# Patient Record
Sex: Male | Born: 1970 | Race: White | Hispanic: No | Marital: Single | Smoking: Current every day smoker
Health system: Southern US, Community
[De-identification: ages and names within clinical notes are randomized; demographics above are authoritative.]

## PROBLEM LIST (undated history)

## (undated) DIAGNOSIS — E7800 Pure hypercholesterolemia, unspecified: Secondary | ICD-10-CM

## (undated) DIAGNOSIS — M1990 Unspecified osteoarthritis, unspecified site: Secondary | ICD-10-CM

## (undated) DIAGNOSIS — W3400XA Accidental discharge from unspecified firearms or gun, initial encounter: Secondary | ICD-10-CM

## (undated) DIAGNOSIS — Y249XXA Unspecified firearm discharge, undetermined intent, initial encounter: Secondary | ICD-10-CM

## (undated) DIAGNOSIS — Z8614 Personal history of Methicillin resistant Staphylococcus aureus infection: Secondary | ICD-10-CM

## (undated) HISTORY — DX: Unspecified osteoarthritis, unspecified site: M19.90

## (undated) HISTORY — DX: Personal history of Methicillin resistant Staphylococcus aureus infection: Z86.14

## (undated) HISTORY — PX: TONSILLECTOMY: SUR1361

## (undated) HISTORY — PX: LEG SURGERY: SHX1003

---

## 2001-05-06 ENCOUNTER — Emergency Department (HOSPITAL_COMMUNITY): Admission: EM | Admit: 2001-05-06 | Discharge: 2001-05-06 | Payer: Self-pay | Admitting: Emergency Medicine

## 2005-04-01 DIAGNOSIS — Z8614 Personal history of Methicillin resistant Staphylococcus aureus infection: Secondary | ICD-10-CM

## 2005-04-01 HISTORY — DX: Personal history of Methicillin resistant Staphylococcus aureus infection: Z86.14

## 2005-04-01 HISTORY — PX: LEG SURGERY: SHX1003

## 2005-11-30 ENCOUNTER — Emergency Department (HOSPITAL_COMMUNITY): Admission: EM | Admit: 2005-11-30 | Discharge: 2005-11-30 | Payer: Self-pay | Admitting: Emergency Medicine

## 2005-12-02 ENCOUNTER — Inpatient Hospital Stay (HOSPITAL_COMMUNITY): Admission: EM | Admit: 2005-12-02 | Discharge: 2005-12-08 | Payer: Self-pay | Admitting: Emergency Medicine

## 2005-12-03 ENCOUNTER — Ambulatory Visit: Payer: Self-pay | Admitting: Infectious Diseases

## 2006-02-05 ENCOUNTER — Ambulatory Visit: Payer: Self-pay | Admitting: Infectious Diseases

## 2006-02-05 LAB — CONVERTED CEMR LAB
Basophils Absolute: 0 10*3/uL (ref 0.0–0.1)
Basophils percent auto: 1 % (ref 0–1)
Eosinophils Relative: 4 % (ref 0–4)
Leukocyte count, blood: 7.5 10*9/L (ref 3.7–10.0)
Lymphocytes Relative: 28 % (ref 15–43)
MCHC: 33.5 g/dL (ref 33.1–35.4)
Monocytes Absolute: 0.5 10*3/uL (ref 0.2–0.7)
Neutrophils Relative %: 61 % (ref 47–77)
Platelets: 293 10*3/uL (ref 152–374)

## 2007-02-10 ENCOUNTER — Encounter (HOSPITAL_COMMUNITY): Admission: RE | Admit: 2007-02-10 | Discharge: 2007-03-12 | Payer: Self-pay | Admitting: Orthopedic Surgery

## 2007-03-16 ENCOUNTER — Encounter (HOSPITAL_COMMUNITY): Admission: RE | Admit: 2007-03-16 | Discharge: 2007-04-01 | Payer: Self-pay | Admitting: Orthopedic Surgery

## 2007-07-13 ENCOUNTER — Encounter (HOSPITAL_COMMUNITY): Admission: RE | Admit: 2007-07-13 | Discharge: 2007-08-12 | Payer: Self-pay

## 2007-08-13 ENCOUNTER — Encounter (HOSPITAL_COMMUNITY): Admission: RE | Admit: 2007-08-13 | Discharge: 2007-09-12 | Payer: Self-pay | Admitting: Orthopedic Surgery

## 2008-05-12 ENCOUNTER — Emergency Department (HOSPITAL_COMMUNITY): Admission: EM | Admit: 2008-05-12 | Discharge: 2008-05-12 | Payer: Self-pay | Admitting: Emergency Medicine

## 2008-05-16 ENCOUNTER — Emergency Department (HOSPITAL_COMMUNITY): Admission: EM | Admit: 2008-05-16 | Discharge: 2008-05-16 | Payer: Self-pay | Admitting: Emergency Medicine

## 2008-05-22 ENCOUNTER — Emergency Department (HOSPITAL_COMMUNITY): Admission: EM | Admit: 2008-05-22 | Discharge: 2008-05-22 | Payer: Self-pay | Admitting: Emergency Medicine

## 2009-09-13 ENCOUNTER — Emergency Department (HOSPITAL_COMMUNITY): Admission: EM | Admit: 2009-09-13 | Discharge: 2009-09-13 | Payer: Self-pay | Admitting: Emergency Medicine

## 2009-10-21 ENCOUNTER — Emergency Department (HOSPITAL_COMMUNITY): Admission: EM | Admit: 2009-10-21 | Discharge: 2009-10-22 | Payer: Self-pay | Admitting: Emergency Medicine

## 2009-11-09 ENCOUNTER — Ambulatory Visit (HOSPITAL_COMMUNITY): Admission: RE | Admit: 2009-11-09 | Discharge: 2009-11-09 | Payer: Self-pay | Admitting: Family Medicine

## 2010-06-16 LAB — POCT I-STAT, CHEM 8
Calcium, Ion: 1.02 mmol/L — ABNORMAL LOW (ref 1.12–1.32)
Creatinine, Ser: 1 mg/dL (ref 0.4–1.5)
Glucose, Bld: 120 mg/dL — ABNORMAL HIGH (ref 70–99)
HCT: 49 % (ref 39.0–52.0)
Hemoglobin: 16.7 g/dL (ref 13.0–17.0)
Potassium: 3.3 mEq/L — ABNORMAL LOW (ref 3.5–5.1)
Sodium: 138 mEq/L (ref 135–145)
TCO2: 24 mmol/L (ref 0–100)
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
## 2010-08-17 NOTE — Op Note (Signed)
NAME:  Jeremy Hancock, Jeremy Hancock             ACCOUNT NO.:  [id number]   MEDICAL RECORD NO.:  [id number]          PATIENT TYPE:  INP   LOCATION:  5037                         FACILITY:  [facility]   PHYSICIAN:  Doralee Albino. Carola Frost, M.D. DATE OF BIRTH:  10-08-1970   DATE OF PROCEDURE:  12/06/2005  DATE OF DISCHARGE:                                 OPERATIVE REPORT   PREOPERATIVE DIAGNOSIS:  Persistent infection right prepatellar bursitis.   POSTOPERATIVE DIAGNOSIS:  Persistent infection right prepatellar bursitis.   PROCEDURE:  Repeat irrigation and debridement of right prepatellar bursa.   SURGEON:  Doralee Albino. Carola Frost, M.D.   ASSISTANT:  None.   ANESTHESIA:  General.   SPECIMENS:  None.   ESTIMATED BLOOD LOSS:  30 mL.   COMPLICATIONS:  None.   DRAINS:  Two.   DISPOSITION:  To PACU.   CONDITION:  Stable   INDICATIONS FOR PROCEDURE:  [name] is a 40 year old male  status post irrigation and debridement of his prepatellar bursa.  We were  hopeful for discharge today but we were watching him to see if he had  adequate resolution of infection.  Although returned afebrile a did have  increasing erythema up the medial thigh and some necrotic debris in the  wound which was unable to be debrided at the bedside secondary to pain.  Consequently, after discussion of risks and benefits of surgery including  the possibility of persistent infection, need for further surgery, the  patient wished to proceed with repeat irrigation and debridement and in  particular, drainage of the suspected pocket of infection going up the  medial thigh.   DESCRIPTION OF PROCEDURE:  [name] was taken to the operating room where  general anesthesia was induced.  His right lower extremity was prepped and  draped in the usual sterile fashion.  He remain on vancomycin for his  antibiotic coverage.  The retaining sutures were removed and Cobb used to  explore the knee.  We did go into a large cavity in the medial  thigh.  There  was, however, no frank purulence in this area.  There was some fluid,  however, and this was drained and irrigated aggressively.  Similarly, we  placed a distal drain after a thorough irrigation and debridement of the  soft tissues.  We used a rongeur to remove the necrotic debris overlying the  patella and paratenon.  There was no tendon exposed.  The old wound was then  packed after placing a few retaining sutures proximally to decrease the size  of the wound that would need to granulate in.  We did use Hemovac type  drains because of the large distance between the base of the wound and the  mouth of the wound thinking that we could achieve enough suction for this to  function appropriately.  We felt this would be better than the Penrose as  used at the previous procedure.  He was then awakened from anesthesia and  transported to the PACU in stable condition after application of a sterile  dressing.   [name], we anticipate, will go on to resolve this infection  and remain on IV  antibiotics for at least the next 2 days and then will be discharged  according to ID recommendations with double-strength Septra for at least 3  weeks of treatment without the need for continued IV therapy.      Doralee Albino. Carola Frost, M.D.  Electronically Signed     MHH/MEDQ  D:  12/06/2005  T:  12/06/2005  Job:  [id number]

## 2010-08-17 NOTE — Discharge Summary (Signed)
NAME:  Jeremy Hancock, Jeremy Hancock             ACCOUNT NO.:  [id number]   MEDICAL RECORD NO.:  [id number]          PATIENT TYPE:  INP   LOCATION:  5037                         FACILITY:  [facility]   PHYSICIAN:  Doralee Albino. Carola Frost, M.D. DATE OF BIRTH:  1971-01-09   DATE OF ADMISSION:  12/02/2005  DATE OF DISCHARGE:  12/08/2005                                 DISCHARGE SUMMARY   DISCHARGE DIAGNOSES:  Right prepatellar septic bursitis.   PROCEDURES:  1. December 03, 2005, I&D of right knee prepatellar bursa.  2. December 06, 2005, repeat I&D of right prepatellar bursa.   HOSPITAL COURSE:  Mr. Ocallaghan was admitted on December 02, 2005, and  underwent irrigation and debridement of his right prepatellar bursitis.  At  that time, he did have an outside culture positive for MRSA.  His sed rate  was 32.  White count was 15.  He was having chills.  Postoperatively, he  felt better.  He had 3 drains placed.  Cultures were sent intraoperatively  to micro and afterward he was started on empiric vanco.  He had been started  on doxycycline at the outside facility.  Infectious disease was consulted to  assist with management.  He continued to spike fevers over the next several  days, including ones greater than 103.  He remained on the vancomycin.  Given his recurrent spikes, as well as increasing erythema of the thigh  noted, on December 05, 2005, we recommended return to the OR on the  following day for repeat irrigation and debridement.  This was performed  without complication and afterward his wound appeared much better and  stable.  He continued with wet-to-dry dressings in the area, which was left  widely open, and he did well enough for consideration of discharge to home.  By that time, his pain was controlled on oral narcotics.  He resumed regular  bowel and urinary function.   DISCHARGE INSTRUCTIONS:  Mr. Ponciano may be weightbearing as tolerated  with the use of immobilizer to restrict motion of the  anterior knee and  continue with daily dressing changes.  He will remain on Septra, as  prescribed by the infectious disease service.  He will plan to follow up  within 1 week for a wound check.  He will contact my office with any  problems, concerns, or questions.      Doralee Albino. Carola Frost, M.D.  Electronically Signed     MHH/MEDQ  D:  01/23/2006  T:  01/24/2006  Job:  [id number]

## 2011-06-22 ENCOUNTER — Emergency Department (HOSPITAL_COMMUNITY)
Admission: EM | Admit: 2011-06-22 | Discharge: 2011-06-22 | Disposition: A | Discharge status: Home / Self Care | Payer: Medicare Other | Attending: Emergency Medicine | Admitting: Emergency Medicine

## 2011-06-22 ENCOUNTER — Emergency Department (HOSPITAL_COMMUNITY): Payer: Medicare Other

## 2011-06-22 ENCOUNTER — Encounter (HOSPITAL_COMMUNITY): Payer: Self-pay | Admitting: *Deleted

## 2011-06-22 DIAGNOSIS — M5430 Sciatica, unspecified side: Secondary | ICD-10-CM | POA: Insufficient documentation

## 2011-06-22 DIAGNOSIS — F17200 Nicotine dependence, unspecified, uncomplicated: Secondary | ICD-10-CM | POA: Insufficient documentation

## 2011-06-22 DIAGNOSIS — S39012A Strain of muscle, fascia and tendon of lower back, initial encounter: Secondary | ICD-10-CM

## 2011-06-22 DIAGNOSIS — S335XXA Sprain of ligaments of lumbar spine, initial encounter: Secondary | ICD-10-CM | POA: Insufficient documentation

## 2011-06-22 DIAGNOSIS — M545 Low back pain, unspecified: Secondary | ICD-10-CM | POA: Insufficient documentation

## 2011-06-22 DIAGNOSIS — M62838 Other muscle spasm: Secondary | ICD-10-CM | POA: Insufficient documentation

## 2011-06-22 DIAGNOSIS — X500XXA Overexertion from strenuous movement or load, initial encounter: Secondary | ICD-10-CM | POA: Insufficient documentation

## 2011-06-22 HISTORY — DX: Unspecified firearm discharge, undetermined intent, initial encounter: Y24.9XXA

## 2011-06-22 HISTORY — DX: Accidental discharge from unspecified firearms or gun, initial encounter: W34.00XA

## 2011-06-22 MED ORDER — IBUPROFEN 800 MG PO TABS: 800.0000 mg | ORAL_TABLET | Freq: Three times a day (TID) | ORAL | Status: AC | PRN

## 2011-06-22 MED ORDER — ONDANSETRON 8 MG PO TBDP
ORAL_TABLET | ORAL | Status: AC
  Administered 2011-06-22: 8 mg
  Filled 2011-06-22: qty 1

## 2011-06-22 MED ORDER — DIAZEPAM 5 MG PO TABS
10.0000 mg | ORAL_TABLET | Freq: Once | ORAL | Status: AC
  Administered 2011-06-22: 10 mg via ORAL
  Filled 2011-06-22: qty 2

## 2011-06-22 MED ORDER — OXYCODONE-ACETAMINOPHEN 5-325 MG PO TABS: 2.0000 | ORAL_TABLET | ORAL | Status: AC | PRN

## 2011-06-22 MED ORDER — HYDROMORPHONE HCL PF 2 MG/ML IJ SOLN
2.0000 mg | Freq: Once | INTRAMUSCULAR | Status: AC
  Administered 2011-06-22: 2 mg via INTRAMUSCULAR
  Filled 2011-06-22: qty 1

## 2011-06-22 MED ORDER — DIAZEPAM 5 MG PO TABS: 5.0000 mg | ORAL_TABLET | Freq: Four times a day (QID) | ORAL | Status: AC | PRN

## 2011-06-22 MED ORDER — KETOROLAC TROMETHAMINE 60 MG/2ML IM SOLN
60.0000 mg | Freq: Once | INTRAMUSCULAR | Status: AC
  Administered 2011-06-22: 60 mg via INTRAMUSCULAR
  Filled 2011-06-22: qty 2
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
## 2011-06-22 NOTE — Discharge Instructions (Signed)
Back Exercises Back exercises help treat and prevent back injuries. The goal of back exercises is to increase the strength of your abdominal and back muscles and the flexibility of your back. These exercises should be started when you no longer have back pain. Back exercises include:  Pelvic Tilt. Lie on your back with your knees bent. Tilt your pelvis until the lower part of your back is against the floor. Hold this position 5 to 10 sec and repeat 5 to 10 times.   Knee to Chest. Pull first 1 knee up against your chest and hold for 20 to 30 seconds, repeat this with the other knee, and then both knees. This may be done with the other leg straight or bent, whichever feels better.   Sit-Ups or Curl-Ups. Bend your knees 90 degrees. Start with tilting your pelvis, and do a partial, slow sit-up, lifting your trunk only 30 to 45 degrees off the floor. Take at least 2 to 3 seconds for each sit-up. Do not do sit-ups with your knees out straight. If partial sit-ups are difficult, simply do the above but with only tightening your abdominal muscles and holding it as directed.   Hip-Lift. Lie on your back with your knees flexed 90 degrees. Push down with your feet and shoulders as you raise your hips a couple inches off the floor; hold for 10 seconds, repeat 5 to 10 times.   Back arches. Lie on your stomach, propping yourself up on bent elbows. Slowly press on your hands, causing an arch in your low back. Repeat 3 to 5 times. Any initial stiffness and discomfort should lessen with repetition over time.   Shoulder-Lifts. Lie face down with arms beside your body. Keep hips and torso pressed to floor as you slowly lift your head and shoulders off the floor.  Do not overdo your exercises, especially in the beginning. Exercises may cause you some mild back discomfort which lasts for a few minutes; however, if the pain is more severe, or lasts for more than 15 minutes, do not continue exercises until you see your  caregiver. Improvement with exercise therapy for back problems is slow.  See your caregivers for assistance with developing a proper back exercise program. Document Released: 04/25/2004 Document Revised: 03/07/2011 Document Reviewed: 03/18/2005 [facility] Patient Information 2012 [place], [place].Back Pain, Adult Low back pain is very common. About 1 in 5 people have back pain.The cause of low back pain is rarely dangerous. The pain often gets better over time.About half of people with a sudden onset of back pain feel better in just 2 weeks. About 8 in 10 people feel better by 6 weeks.  CAUSES Some common causes of back pain include:  Strain of the muscles or ligaments supporting the spine.   Wear and tear (degeneration) of the spinal discs.   Arthritis.   Direct injury to the back.  DIAGNOSIS Most of the time, the direct cause of low back pain is not known.However, back pain can be treated effectively even when the exact cause of the pain is unknown.Answering your caregiver's questions about your overall health and symptoms is one of the most accurate ways to make sure the cause of your pain is not dangerous. If your caregiver needs more information, he or she may order lab work or imaging tests (X-rays or MRIs).However, even if imaging tests show changes in your back, this usually does not require surgery. HOME CARE INSTRUCTIONS For many people, back pain returns.Since low back pain is rarely  dangerous, it is often a condition that people can learn to [facility] their own.   Remain active. It is stressful on the back to sit or stand in one place. Do not sit, drive, or stand in one place for more than 30 minutes at a time. Take short walks on level surfaces as soon as pain allows.Try to increase the length of time you walk each day.   Do not stay in bed.Resting more than 1 or 2 days can delay your recovery.   Do not avoid exercise or work.Your body is made to move.It is not dangerous  to be active, even though your back may hurt.Your back will likely heal faster if you return to being active before your pain is gone.   Pay attention to your body when you bend and lift. Many people have less discomfortwhen lifting if they bend their knees, keep the load close to their bodies,and avoid twisting. Often, the most comfortable positions are those that put less stress on your recovering back.   Find a comfortable position to sleep. Use a firm mattress and lie on your side with your knees slightly bent. If you lie on your back, put a pillow under your knees.   Only take over-the-counter or prescription medicines as directed by your caregiver. Over-the-counter medicines to reduce pain and inflammation are often the most helpful.Your caregiver may prescribe muscle relaxant drugs.These medicines help dull your pain so you can more quickly return to your normal activities and healthy exercise.   Put ice on the injured area.   Put ice in a plastic bag.   Place a towel between your skin and the bag.   Leave the ice on for 15 to 20 minutes, 3 to 4 times a day for the first 2 to 3 days. After that, ice and heat may be alternated to reduce pain and spasms.   Ask your caregiver about trying back exercises and gentle massage. This may be of some benefit.   Avoid feeling anxious or stressed.Stress increases muscle tension and can worsen back pain.It is important to recognize when you are anxious or stressed and learn ways to manage it.Exercise is a great option.  SEEK MEDICAL CARE IF:  You have pain that is not relieved with rest or medicine.   You have pain that does not improve in 1 week.   You have new symptoms.   You are generally not feeling well.  SEEK IMMEDIATE MEDICAL CARE IF:   You have pain that radiates from your back into your legs.   You develop new bowel or bladder control problems.   You have unusual weakness or numbness in your arms or legs.   You develop  nausea or vomiting.   You develop abdominal pain.   You feel faint.  Document Released: 03/18/2005 Document Revised: 03/07/2011 Document Reviewed: 08/06/2010 [facility] Patient Information 2012 [place], [place].Cryotherapy Cryotherapy means treatment with cold. Ice or gel packs can be used to reduce both pain and swelling. Ice is the most helpful within the first 24 to 48 hours after an injury or flareup from overusing a muscle or joint. Sprains, strains, spasms, burning pain, shooting pain, and aches can all be eased with ice. Ice can also be used when recovering from surgery. Ice is effective, has very few side effects, and is safe for most people to use. PRECAUTIONS  Ice is not a safe treatment option for people with:  Raynaud's phenomenon. This is a condition affecting small blood vessels in  the extremities. Exposure to cold may cause your problems to return.   Cold hypersensitivity. There are many forms of cold hypersensitivity, including:   Cold urticaria. Red, itchy hives appear on the skin when the tissues begin to warm after being iced.   Cold erythema. This is a red, itchy rash caused by exposure to cold.   Cold hemoglobinuria. Red blood cells break down when the tissues begin to warm after being iced. The hemoglobin that carry oxygen are passed into the urine because they cannot combine with blood proteins fast enough.   Numbness or altered sensitivity in the area being iced.  If you have any of the following conditions, do not use ice until you have discussed cryotherapy with your caregiver:  Heart conditions, such as arrhythmia, angina, or chronic heart disease.   High blood pressure.   Healing wounds or open skin in the area being iced.   Current infections.   Rheumatoid arthritis.   Poor circulation.   Diabetes.  Ice slows the blood flow in the region it is applied. This is beneficial when trying to stop inflamed tissues from spreading irritating chemicals to  surrounding tissues. However, if you expose your skin to cold temperatures for too long or without the proper protection, you can damage your skin or nerves. Watch for signs of skin damage due to cold. HOME CARE INSTRUCTIONS Follow these tips to use ice and cold packs safely.  Place a dry or damp towel between the ice and skin. A damp towel will cool the skin more quickly, so you may need to shorten the time that the ice is used.   For a more rapid response, add gentle compression to the ice.   Ice for no more than 10 to 20 minutes at a time. The bonier the area you are icing, the less time it will take to get the benefits of ice.   Check your skin after 5 minutes to make sure there are no signs of a poor response to cold or skin damage.   Rest 20 minutes or more in between uses.   Once your skin is numb, you can end your treatment. You can test numbness by very lightly touching your skin. The touch should be so light that you do not see the skin dimple from the pressure of your fingertip. When using ice, most people will feel these normal sensations in this order: cold, burning, aching, and numbness.   Do not use ice on someone who cannot communicate their responses to pain, such as small children or people with dementia.  HOW TO MAKE AN ICE PACK Ice packs are the most common way to use ice therapy. Other methods include ice massage, ice baths, and cryo-sprays. Muscle creams that cause a cold, tingly feeling do not offer the same benefits that ice offers and should not be used as a substitute unless recommended by your caregiver. To make an ice pack, do one of the following:  Place crushed ice or a bag of frozen vegetables in a sealable plastic bag. Squeeze out the excess air. Place this bag inside another plastic bag. Slide the bag into a pillowcase or place a damp towel between your skin and the bag.   Mix 3 parts water with 1 part rubbing alcohol. Freeze the mixture in a sealable plastic  bag. When you remove the mixture from the freezer, it will be slushy. Squeeze out the excess air. Place this bag inside another plastic bag. Slide the bag  into a pillowcase or place a damp towel between your skin and the bag.  SEEK MEDICAL CARE IF:  You develop white spots on your skin. This may give the skin a blotchy (mottled) appearance.   Your skin turns blue or pale.   Your skin becomes waxy or hard.   Your swelling gets worse.  MAKE SURE YOU:   Understand these instructions.   Will watch your condition.   Will get help right away if you are not doing well or get worse.  Document Released: 11/12/2010 Document Revised: 03/07/2011 Document Reviewed: 11/12/2010 [facility] Patient Information 2012 [place], [place].Lumbosacral Strain Lumbosacral strain is one of the most common causes of back pain. There are many causes of back pain. Most are not serious conditions. CAUSES  Your backbone (spinal column) is made up of 24 main vertebral bodies, the sacrum, and the coccyx. These are held together by muscles and tough, fibrous tissue (ligaments). Nerve roots pass through the openings between the vertebrae. A sudden move or injury to the back may cause injury to, or pressure on, these nerves. This may result in localized back pain or pain movement (radiation) into the buttocks, down the leg, and into the foot. Sharp, shooting pain from the buttock down the back of the leg (sciatica) is frequently associated with a ruptured (herniated) disk. Pain may be caused by muscle spasm alone. Your caregiver can often find the cause of your pain by the details of your symptoms and an exam. In some cases, you may need tests (such as X-rays). Your caregiver will work with you to decide if any tests are needed based on your specific exam. HOME CARE INSTRUCTIONS   Avoid an underactive lifestyle. Active exercise, as directed by your caregiver, is your greatest weapon against back pain.   Avoid hard physical  activities (tennis, racquetball, waterskiing) if you are not in proper physical condition for it. This may aggravate or create problems.   If you have a back problem, avoid sports requiring sudden body movements. Swimming and walking are generally safer activities.   Maintain good posture.   Avoid becoming overweight (obese).   Use bed rest for only the most extreme, sudden (acute) episode. Your caregiver will help you determine how much bed rest is necessary.   For acute conditions, you may put ice on the injured area.   Put ice in a plastic bag.   Place a towel between your skin and the bag.   Leave the ice on for 15 to 20 minutes at a time, every 2 hours, or as needed.   After you are improved and more active, it may help to apply heat for 30 minutes before activities.  See your caregiver if you are having pain that lasts longer than expected. Your caregiver can advise appropriate exercises or therapy if needed. With conditioning, most back problems can be avoided. SEEK IMMEDIATE MEDICAL CARE IF:   You have numbness, tingling, weakness, or problems with the use of your arms or legs.   You experience severe back pain not relieved with medicines.   There is a change in bowel or bladder control.   You have increasing pain in any area of the body, including your belly (abdomen).   You notice shortness of breath, dizziness, or feel faint.   You feel sick to your stomach (nauseous), are throwing up (vomiting), or become sweaty.   You notice discoloration of your toes or legs, or your feet get very cold.   Your back  pain is getting worse.   You have a fever.  MAKE SURE YOU:   Understand these instructions.   Will watch your condition.   Will get help right away if you are not doing well or get worse.  Document Released: 12/26/2004 Document Revised: 03/07/2011 Document Reviewed: 06/17/2008 [facility] Patient Information 2012 [place], [place].Muscle Strain A muscle strain, or  pulled muscle, occurs when a muscle is over-stretched. A small number of muscle fibers may also be torn. This is especially common in athletes. This happens when a sudden violent force placed on a muscle pushes it past its capacity. Usually, recovery from a pulled muscle takes 1 to 2 weeks. But complete healing will take 5 to 6 weeks. There are millions of muscle fibers. Following injury, your body will usually return to normal quickly. HOME CARE INSTRUCTIONS   While awake, apply ice to the sore muscle for 15 to 20 minutes each hour for the first 2 days. Put ice in a plastic bag and place a towel between the bag of ice and your skin.   Do not use the pulled muscle for several days. Do not use the muscle if you have pain.   You may wrap the injured area with an elastic bandage for comfort. Be careful not to bind it too tightly. This may interfere with blood circulation.   Only take over-the-counter or prescription medicines for pain, discomfort, or fever as directed by your caregiver. Do not use aspirin as this will increase bleeding (bruising) at injury site.   Warming up before exercise helps prevent muscle strains.  SEEK MEDICAL CARE IF:  There is increased pain or swelling in the affected area. MAKE SURE YOU:   Understand these instructions.   Will watch your condition.   Will get help right away if you are not doing well or get worse.  Document Released: 03/18/2005 Document Revised: 03/07/2011 Document Reviewed: 10/15/2006 [facility] Patient Information 2012 [place], [place].Sciatica Sciatica is a weakness and/or changes in sensation (tingling, jolts, hot and cold, numbness) along the path the sciatic nerve travels. Irritation or damage to lumbar nerve roots is often also referred to as lumbar radiculopathy.  Lumbar radiculopathy (Sciatica) is the most common form of this problem. Radiculopathy can occur in any of the nerves coming out of the spinal cord. The problems caused depend on which  nerves are involved. The sciatic nerve is the large nerve supplying the branches of nerves going from the hip to the toes. It often causes a numbness or weakness in the skin and/or muscles that the sciatic nerve serves. It also may cause symptoms (problems) of pain, burning, tingling, or electric shock-like feelings in the path of this nerve. This usually comes from injury to the fibers that make up the sciatic nerve. Some of these symptoms are low back pain and/or unpleasant feelings in the following areas:  From the mid-buttock down the back of the leg to the back of the knee.   And/or the outside of the calf and top of the foot.   And/or behind the inner ankle to the sole of the foot.  CAUSES   Herniated or slipped disc. Discs are the little cushions between the bones in the back.   Pressure by the piriformis muscle in the buttock on the sciatic nerve (Piriformis Syndrome).   Misalignment of the bones in the lower back and buttocks (Sacroiliac Joint Derangement).   Narrowing of the spinal canal that puts pressure on or pinches the fibers that make up  the sciatic nerve.   A slipped vertebra that is out of line with those above or beneath it.   Abnormality of the nervous system itself so that nerve fibers do not transmit signals properly, especially to feet and calves (neuropathy).   Tumor (this is rare).  Your caregiver can usually determine the cause of your sciatica and begin the treatment most likely to help you. TREATMENT  Taking over-the-counter painkillers, physical therapy, rest, exercise, spinal manipulation, and injections of anesthetics and/or steroids may be used. Surgery, acupuncture, and Yoga can also be effective. Mind over matter techniques, mental imagery, and changing factors such as your bed, chair, desk height, posture, and activities are other treatments that may be helpful. You and your caregiver can help determine what is best for you. With proper diagnosis, the cause  of most sciatica can be identified and removed. Communication and cooperation between your caregiver and you is essential. If you are not successful immediately, do not be discouraged. With time, a proper treatment can be found that will make you comfortable. HOME CARE INSTRUCTIONS   If the pain is coming from a problem in the back, applying ice to that area for 15 to 20 minutes, 3 to 4 times per day while awake, may be helpful. Put the ice in a plastic bag. Place a towel between the bag of ice and your skin.   You may exercise or perform your usual activities if these do not aggravate your pain, or as suggested by your caregiver.   Only take over-the-counter or prescription medicines for pain, discomfort, or fever as directed by your caregiver.   If your caregiver has given you a follow-up appointment, it is very important to keep that appointment. Not keeping the appointment could result in a chronic or permanent injury, pain, and disability. If there is any problem keeping the appointment, you must call back to this facility for assistance.  SEEK IMMEDIATE MEDICAL CARE IF:   You experience loss of control of bowel or bladder.   You have increasing weakness in the trunk, buttocks, or legs.   There is numbness in any areas from the hip down to the toes.   You have difficulty walking or keeping your balance.   You have any of the above, with fever or forceful vomiting.  Document Released: 03/12/2001 Document Revised: 03/07/2011 Document Reviewed: 10/30/2007 [facility] Patient Information 2012 [place], [place].Radicular Pain Radicular pain in either the arm or leg is usually from a bulging or herniated disk in the spine. A piece of the herniated disk may press against the nerves as the nerves exit the spine. This causes pain which is felt at the tips of the nerves down the arm or leg. Other causes of radicular pain may include:  Fractures.   Heart disease.   Cancer.   An abnormal and  usually degenerative state of the nervous system or nerves (neuropathy).  Diagnosis may require CT or MRI scanning to determine the primary cause.  Nerves that start at the neck (nerve roots) may cause radicular pain in the outer shoulder and arm. It can spread down to the thumb and fingers. The symptoms vary depending on which nerve root has been affected. In most cases radicular pain improves with conservative treatment. Neck problems may require physical therapy, a neck collar, or cervical traction. Treatment may take many weeks, and surgery may be considered if the symptoms do not improve.  Conservative treatment is also recommended for sciatica. Sciatica causes pain to radiate from  the lower back or buttock area down the leg into the foot. Often there is a history of back problems. Most patients with sciatica are better after 2 to 4 weeks of rest and other supportive care. Short term bed rest can reduce the disk pressure considerably. Sitting, however, is not a good position since this increases the pressure on the disk. You should avoid bending, lifting, and all other activities which make the problem worse. Traction can be used in severe cases. Surgery is usually reserved for patients who do not improve within the first months of treatment. Only take over-the-counter or prescription medicines for pain, discomfort, or fever as directed by your caregiver. Narcotics and muscle relaxants may help by relieving more severe pain and spasm and by providing mild sedation. Cold or massage can give significant relief. Spinal manipulation is not recommended. It can increase the degree of disc protrusion. Epidural steroid injections are often effective treatment for radicular pain. These injections deliver medicine to the spinal nerve in the space between the protective covering of the spinal cord and back bones (vertebrae). Your caregiver can give you more information about steroid injections. These injections are  most effective when given within two weeks of the onset of pain.  You should see your caregiver for follow up care as recommended. A program for neck and back injury rehabilitation with stretching and strengthening exercises is an important part of management.  SEEK IMMEDIATE MEDICAL CARE IF:  You develop increased pain, weakness, or numbness in your arm or leg.   You develop difficulty with bladder or bowel control.   You develop abdominal pain.  Document Released: 04/25/2004 Document Revised: 03/07/2011 Document Reviewed: 07/11/2008 [facility] Patient Information 2012 [place], [place].Spasticity Spasticity is a condition in which certain muscles contract continuously. This causes stiffness or tightness of the muscles. It may interfere with movement, speech, and manner of walking. CAUSES  This condition is usually caused by damage to the portion of the brain or spinal cord that controls voluntary movement. It may occur in association with:  Spinal cord injury.   Multiple sclerosis.   Cerebral palsy.   Brain damage due to lack of oxygen.   Brain trauma.   Severe head injury.   Metabolic diseases such as:   Adrenoleukodystrophy.   ALS Truddie Hidden Gehrig's disease).   Phenylketonuria.  SYMPTOMS   Increased muscle tone (hypertonicity).   A series of rapid muscle contractions (clonus).   Exaggerated deep tendon reflexes.   Muscle spasms.   Involuntary crossing of the legs (scissoring).   Fixed joints.  The degree of spasticity varies. It ranges from mild muscle stiffness to severe, painful, and uncontrollable muscle spasms. It can interfere with rehabilitation in patients with certain disorders. It often interferes with daily activities. TREATMENT  Treatment may include:  Medications.   Physical therapy regimens. They may include muscle stretching and range of motion exercises. These help prevent shrinkage or shortening of muscles. They also help reduce the severity of  symptoms.   Surgery. This may be recommended for tendon release or to sever the nerve-muscle pathway.  PROGNOSIS  The outcome for those with spasticity depends on:  Severity of the spasticity.   Associated disorder(s).  Document Released: 03/08/2002 Document Revised: 03/07/2011 Document Reviewed: 03/18/2005 RESOURCE GUIDE  Dental Problems  Patients with Medicaid: [facility] [phone]  [address] Phone:  [phone]                                                  Phone:  [phone]  If unable to pay or uninsured, contact:  [facility] or [facility]. to become qualified for the adult dental clinic.  Chronic Pain Problems Contact [facility] [facility]  [phone] Patients need to be referred by their primary care doctor.  Insufficient Money for Medicine Contact United Way:  call "211" or [facility] [phone].  No Primary Care Doctor Call [facility]  [phone] Other agencies that provide inexpensive medical care    [facility]  [phone]    [facility]  [phone]    [facility]  [phone]    [facility]  [phone]    [facility]  [phone]    [facility] Child Clinic  [phone]  Psychological Services [facility] Behavioral Health  [phone] [facility] Services  [phone] [facility] Mental Health   [phone] (emergency services [phone])  Substance Abuse Resources [facility]  [phone] [facility] [phone] [facility] [phone] Floydene Flock [phone] Residential & Outpatient Substance Abuse Program  [phone]  Abuse/Neglect [facility] Child Abuse Hotline 725-026-5644 [facility] Child Abuse Hotline [phone] (After Hours)  Emergency Shelter [facility] Ministries [phone]  Maternity Homes Room at the  [place] [facility] [phone] [facility] Services [phone]  MRSA Hotline #:   734-712-2688    [facility] Resources  [facility]     [employer]                          [facility] Dept[facility] [address] [place]                       [address]      [address]  Blondell Reveal Phone:  [phone]                                   Phone:  [phone]                 Phone:  [phone]  [facility] Mental Health Phone:  [phone]  [facility] Child Abuse Hotline [phone] (After Hours)   [facility] Patient Information 2012 [place], [place].

## 2013-04-05 ENCOUNTER — Emergency Department: Payer: Self-pay | Admitting: Emergency Medicine

## 2013-04-05 LAB — COMPREHENSIVE METABOLIC PANEL
ALBUMIN: 3.7 g/dL (ref 3.4–5.0)
ANION GAP: 4 — AB (ref 7–16)
AST: 28 U/L (ref 15–37)
Alkaline Phosphatase: 98 U/L
BILIRUBIN TOTAL: 0.2 mg/dL (ref 0.2–1.0)
BUN: 8 mg/dL (ref 7–18)
Calcium, Total: 8.9 mg/dL (ref 8.5–10.1)
Chloride: 106 mmol/L (ref 98–107)
Co2: 30 mmol/L (ref 21–32)
Creatinine: 1.14 mg/dL (ref 0.60–1.30)
EGFR (Non-African Amer.): >60
Glucose: 84 mg/dL (ref 65–99)
Osmolality: 277 (ref 275–301)
POTASSIUM: 3.7 mmol/L (ref 3.5–5.1)
SGPT (ALT): 49 U/L (ref 12–78)
Sodium: 140 mmol/L (ref 136–145)
Total Protein: 7.1 g/dL (ref 6.4–8.2)

## 2013-04-05 LAB — CBC WITH DIFFERENTIAL/PLATELET
BASOS ABS: 0.1 10*3/uL (ref 0.0–0.1)
BASOS PCT: 0.7 %
Eosinophil #: 0.3 10*3/uL (ref 0.0–0.7)
Eosinophil %: 3.6 %
HCT: 45.2 % (ref 40.0–52.0)
HGB: 15.3 g/dL (ref 13.0–18.0)
LYMPHS ABS: 2.6 10*3/uL (ref 1.0–3.6)
Lymphocyte %: 28.7 %
MCH: 29.3 pg (ref 26.0–34.0)
MCHC: 33.9 g/dL (ref 32.0–36.0)
MCV: 87 fL (ref 80–100)
Monocyte #: 0.7 x10 3/mm (ref 0.2–1.0)
Monocyte %: 7.3 %
Neutrophil #: 5.5 10*3/uL (ref 1.4–6.5)
Neutrophil %: 59.7 %
Platelet: 255 10*3/uL (ref 150–440)
RBC: 5.22 10*6/uL (ref 4.40–5.90)
RDW: 14 % (ref 11.5–14.5)
WBC: 9.2 10*3/uL (ref 3.8–10.6)

## 2013-04-05 LAB — URINALYSIS, COMPLETE
BILIRUBIN, UR: NEGATIVE
BLOOD: NEGATIVE
Bacteria: NONE SEEN
GLUCOSE, UR: NEGATIVE mg/dL (ref 0–75)
Ketone: NEGATIVE
Leukocyte Esterase: NEGATIVE
Nitrite: NEGATIVE
PROTEIN: NEGATIVE
Ph: 7 (ref 4.5–8.0)
RBC,UR: <1 /HPF (ref 0–5)
SPECIFIC GRAVITY: 1.01 (ref 1.003–1.030)
Squamous Epithelial: NONE SEEN
WBC UR: NONE SEEN /HPF (ref 0–5)

## 2013-04-05 LAB — RAPID INFLUENZA A&B ANTIGENS (ARMC ONLY)

## 2013-04-05 LAB — LIPASE, BLOOD: LIPASE: 146 U/L (ref 73–393)

## 2013-04-23 ENCOUNTER — Encounter: Payer: Self-pay | Admitting: Family Medicine

## 2013-04-23 DIAGNOSIS — Z8614 Personal history of Methicillin resistant Staphylococcus aureus infection: Secondary | ICD-10-CM | POA: Insufficient documentation

## 2013-04-23 DIAGNOSIS — M1990 Unspecified osteoarthritis, unspecified site: Secondary | ICD-10-CM | POA: Insufficient documentation

## 2013-04-29 ENCOUNTER — Encounter: Payer: Self-pay | Admitting: Physician Assistant

## 2013-04-29 ENCOUNTER — Ambulatory Visit (INDEPENDENT_AMBULATORY_CARE_PROVIDER_SITE_OTHER): Payer: Medicare HMO | Admitting: Physician Assistant

## 2013-04-29 VITALS — BP 118/76 | HR 64 | Temp 97.8°F | Resp 18 | Ht 64.5 in | Wt 156.0 lb

## 2013-04-29 DIAGNOSIS — Z800 Family history of malignant neoplasm of digestive organs: Secondary | ICD-10-CM

## 2013-04-29 DIAGNOSIS — R1032 Left lower quadrant pain: Secondary | ICD-10-CM

## 2013-04-29 DIAGNOSIS — K921 Melena: Secondary | ICD-10-CM

## 2013-04-29 DIAGNOSIS — Z86010 Personal history of colonic polyps: Secondary | ICD-10-CM

## 2013-04-29 LAB — CBC W/MCH & 3 PART DIFF
HEMATOCRIT: 50.8 % (ref 39.0–52.0)
Hemoglobin: 16.2 g/dL (ref 13.0–17.0)
LYMPHS ABS: 1.9 10*3/uL (ref 0.7–4.0)
LYMPHS PCT: 28 % (ref 12–46)
MCH: 29.8 pg (ref 26.0–34.0)
MCHC: 31.9 g/dL (ref 30.0–36.0)
MCV: 93.4 fL (ref 78.0–100.0)
NEUTROS PCT: 64 % (ref 43–77)
Neutro Abs: 4.2 10*3/uL (ref 1.7–7.7)
Platelets: 241 10*3/uL (ref 150–400)
RBC: 5.44 MIL/uL (ref 4.22–5.81)
RDW: 16 % — ABNORMAL HIGH (ref 11.5–15.5)
WBC mixed population %: 8 % (ref 3–18)
WBC mixed population: 0.5 10*3/uL (ref 0.1–1.8)
WBC: 6.6 10*3/uL (ref 4.0–10.5)
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
## 2013-05-20 ENCOUNTER — Other Ambulatory Visit (HOSPITAL_COMMUNITY): Payer: Self-pay | Admitting: Cardiology

## 2013-05-20 DIAGNOSIS — R079 Chest pain, unspecified: Secondary | ICD-10-CM

## 2013-05-28 ENCOUNTER — Encounter (HOSPITAL_COMMUNITY)
Admission: RE | Admit: 2013-05-28 | Discharge: 2013-05-28 | Disposition: A | Discharge status: Home / Self Care | Payer: Medicare HMO | Source: Ambulatory Visit | Attending: Cardiology | Admitting: Cardiology

## 2013-05-28 ENCOUNTER — Ambulatory Visit (HOSPITAL_COMMUNITY)
Admission: RE | Admit: 2013-05-28 | Discharge: 2013-05-28 | Disposition: A | Discharge status: Home / Self Care | Payer: Medicare HMO | Source: Ambulatory Visit | Attending: Cardiology | Admitting: Cardiology

## 2013-05-28 ENCOUNTER — Other Ambulatory Visit: Payer: Self-pay

## 2013-05-28 DIAGNOSIS — F17200 Nicotine dependence, unspecified, uncomplicated: Secondary | ICD-10-CM | POA: Insufficient documentation

## 2013-05-28 DIAGNOSIS — R079 Chest pain, unspecified: Secondary | ICD-10-CM | POA: Insufficient documentation

## 2013-05-28 DIAGNOSIS — Z8249 Family history of ischemic heart disease and other diseases of the circulatory system: Secondary | ICD-10-CM | POA: Insufficient documentation

## 2013-05-28 LAB — BASIC METABOLIC PANEL
BUN: 9 mg/dL (ref 6–23)
CO2: 26 mEq/L (ref 19–32)
Calcium: 9.4 mg/dL (ref 8.4–10.5)
Chloride: 105 mEq/L (ref 96–112)
Creatinine, Ser: 0.97 mg/dL (ref 0.50–1.35)
GLUCOSE: 96 mg/dL (ref 70–99)
Potassium: 3.9 mEq/L (ref 3.7–5.3)
Sodium: 143 mEq/L (ref 137–147)

## 2013-05-28 LAB — HEPATIC FUNCTION PANEL
ALBUMIN: 3.7 g/dL (ref 3.5–5.2)
ALT: 41 U/L (ref 0–53)
AST: 24 U/L (ref 0–37)
Alkaline Phosphatase: 76 U/L (ref 39–117)
Bilirubin, Direct: <0.2 mg/dL (ref 0.0–0.3)
Total Bilirubin: 0.5 mg/dL (ref 0.3–1.2)
Total Protein: 7.1 g/dL (ref 6.0–8.3)

## 2013-05-28 LAB — MAGNESIUM: Magnesium: 2 mg/dL (ref 1.5–2.5)

## 2013-05-28 LAB — LIPID PANEL
CHOL/HDL RATIO: 9.3 ratio
Cholesterol: 260 mg/dL — ABNORMAL HIGH (ref 0–200)
HDL: 28 mg/dL — AB (ref >39)
LDL CALC: 198 mg/dL — AB (ref 0–99)
TRIGLYCERIDES: 169 mg/dL — AB (ref <150)
VLDL: 34 mg/dL (ref 0–40)

## 2013-05-28 LAB — TSH: TSH: 1.691 u[IU]/mL (ref 0.350–4.500)

## 2013-05-28 MED ORDER — TECHNETIUM TC 99M SESTAMIBI GENERIC - CARDIOLITE
10.0000 | Freq: Once | INTRAVENOUS | Status: AC | PRN
  Administered 2013-05-28: 10 via INTRAVENOUS

## 2013-05-28 MED ORDER — TECHNETIUM TC 99M SESTAMIBI GENERIC - CARDIOLITE
30.0000 | Freq: Once | INTRAVENOUS | Status: AC | PRN
  Administered 2013-05-28: 30 via INTRAVENOUS

## 2013-06-23 ENCOUNTER — Ambulatory Visit: Payer: Medicare HMO | Admitting: Neurology

## 2013-08-25 ENCOUNTER — Other Ambulatory Visit: Payer: Self-pay | Admitting: Family Medicine

## 2013-08-25 DIAGNOSIS — E7800 Pure hypercholesterolemia, unspecified: Secondary | ICD-10-CM

## 2013-11-22 ENCOUNTER — Other Ambulatory Visit: Payer: Medicare HMO

## 2013-11-22 DIAGNOSIS — E7800 Pure hypercholesterolemia, unspecified: Secondary | ICD-10-CM

## 2013-11-22 LAB — COMPLETE METABOLIC PANEL WITH GFR
ALBUMIN: 4.5 g/dL (ref 3.5–5.2)
ALK PHOS: 79 U/L (ref 39–117)
ALT: 37 U/L (ref 0–53)
AST: 22 U/L (ref 0–37)
BILIRUBIN TOTAL: 0.4 mg/dL (ref 0.2–1.2)
BUN: 7 mg/dL (ref 6–23)
CO2: 28 meq/L (ref 19–32)
CREATININE: 0.97 mg/dL (ref 0.50–1.35)
Calcium: 9.9 mg/dL (ref 8.4–10.5)
Chloride: 104 mEq/L (ref 96–112)
GFR, Est African American: >89 mL/min
GFR, Est Non African American: >89 mL/min
GLUCOSE: 81 mg/dL (ref 70–99)
POTASSIUM: 4.3 meq/L (ref 3.5–5.3)
SODIUM: 141 meq/L (ref 135–145)
TOTAL PROTEIN: 6.9 g/dL (ref 6.0–8.3)

## 2013-11-22 LAB — LIPID PANEL
CHOL/HDL RATIO: 7.3 ratio
Cholesterol: 249 mg/dL — ABNORMAL HIGH (ref 0–200)
HDL: 34 mg/dL — ABNORMAL LOW (ref >39)
LDL Cholesterol: 174 mg/dL — ABNORMAL HIGH (ref 0–99)
Triglycerides: 205 mg/dL — ABNORMAL HIGH (ref <150)
VLDL: 41 mg/dL — ABNORMAL HIGH (ref 0–40)

## 2013-12-01 ENCOUNTER — Ambulatory Visit (INDEPENDENT_AMBULATORY_CARE_PROVIDER_SITE_OTHER): Payer: Medicare HMO | Admitting: Physician Assistant

## 2013-12-01 ENCOUNTER — Encounter: Payer: Self-pay | Admitting: Physician Assistant

## 2013-12-01 VITALS — BP 100/78 | HR 74 | Temp 97.8°F | Resp 18 | Ht 64.5 in | Wt 151.0 lb

## 2013-12-01 DIAGNOSIS — E785 Hyperlipidemia, unspecified: Secondary | ICD-10-CM

## 2013-12-01 DIAGNOSIS — Z8249 Family history of ischemic heart disease and other diseases of the circulatory system: Secondary | ICD-10-CM

## 2013-12-01 DIAGNOSIS — K921 Melena: Secondary | ICD-10-CM

## 2013-12-01 MED ORDER — PRAVASTATIN SODIUM 20 MG PO TABS: 20.0000 mg | ORAL_TABLET | Freq: Every day | ORAL | Status: DC
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
## 2013-12-21 ENCOUNTER — Telehealth: Payer: Self-pay | Admitting: Family Medicine

## 2013-12-21 DIAGNOSIS — E785 Hyperlipidemia, unspecified: Secondary | ICD-10-CM

## 2013-12-21 NOTE — Telephone Encounter (Signed)
Agree 

## 2013-12-21 NOTE — Telephone Encounter (Signed)
Insurance asking for 90 day supply of Pravastatin.  This is new RX.  Pt has 6 week follow up appt to monitor new RX.  If all OK then will gladly do 90 day refills.

## 2014-01-13 ENCOUNTER — Encounter: Payer: Self-pay | Admitting: Family Medicine

## 2014-01-13 ENCOUNTER — Ambulatory Visit: Payer: Medicare HMO | Admitting: Physician Assistant

## 2014-01-17 ENCOUNTER — Telehealth: Payer: Self-pay | Admitting: Family Medicine

## 2014-01-17 NOTE — Telephone Encounter (Signed)
Agree. Need to get more history from pt and do exam of hip and may need hip eval (XRay etc).  Needs OV to further address this.

## 2014-01-17 NOTE — Telephone Encounter (Signed)
[employer] concerned pt not taking Pravastatin correctly.  I called patient.  He says the pravastatin is causing him the same hip pain the other cholesterol medicines he has tried have.  Was unable to make last weeks follow up appt due to family situation.  Encouraged him to reschedule as soon as possible so provider can address his cholesterol medications.

## 2014-04-20 ENCOUNTER — Encounter: Payer: Self-pay | Admitting: Physician Assistant

## 2014-04-20 ENCOUNTER — Ambulatory Visit (INDEPENDENT_AMBULATORY_CARE_PROVIDER_SITE_OTHER): Payer: Medicare Other | Admitting: Physician Assistant

## 2014-04-20 VITALS — BP 114/82 | HR 76 | Temp 97.8°F | Resp 18 | Wt 157.0 lb

## 2014-04-20 DIAGNOSIS — J320 Chronic maxillary sinusitis: Secondary | ICD-10-CM

## 2014-04-20 DIAGNOSIS — Z8249 Family history of ischemic heart disease and other diseases of the circulatory system: Secondary | ICD-10-CM

## 2014-04-20 DIAGNOSIS — E785 Hyperlipidemia, unspecified: Secondary | ICD-10-CM | POA: Diagnosis not present

## 2014-04-20 DIAGNOSIS — J988 Other specified respiratory disorders: Secondary | ICD-10-CM | POA: Diagnosis not present

## 2014-04-20 DIAGNOSIS — B9689 Other specified bacterial agents as the cause of diseases classified elsewhere: Secondary | ICD-10-CM

## 2014-04-20 LAB — LIPID PANEL
CHOL/HDL RATIO: 6.5 ratio
CHOLESTEROL: 235 mg/dL — AB (ref 0–200)
HDL: 36 mg/dL — AB (ref >39)
LDL Cholesterol: 175 mg/dL — ABNORMAL HIGH (ref 0–99)
Triglycerides: 121 mg/dL (ref <150)
VLDL: 24 mg/dL (ref 0–40)

## 2014-04-20 LAB — HEPATIC FUNCTION PANEL
ALBUMIN: 4.1 g/dL (ref 3.5–5.2)
ALT: 31 U/L (ref 0–53)
AST: 24 U/L (ref 0–37)
Alkaline Phosphatase: 68 U/L (ref 39–117)
BILIRUBIN DIRECT: 0.1 mg/dL (ref 0.0–0.3)
Indirect Bilirubin: 0.5 mg/dL (ref 0.2–1.2)
Total Bilirubin: 0.6 mg/dL (ref 0.2–1.2)
Total Protein: 6.8 g/dL (ref 6.0–8.3)

## 2014-04-20 MED ORDER — PREDNISONE 20 MG PO TABS: ORAL_TABLET | ORAL | Status: DC

## 2014-04-20 MED ORDER — AMOXICILLIN-POT CLAVULANATE 875-125 MG PO TABS: 1.0000 | ORAL_TABLET | Freq: Two times a day (BID) | ORAL | Status: DC

## 2014-04-20 NOTE — Progress Notes (Signed)
    Patient ID: Jeremy Hancock MRN: [id number], DOB: 08/31/1970, 44 y.o. Date of Encounter: 04/20/2014, 1:29 PM    Chief Complaint:  Chief Complaint  Patient presents with  . sick x 2 days    sinus infection     HPI: 44 y.o. year old white male presents with above symptoms. Says he has had a lot of pressure in right cheek and behind his eyes for several weeks now. Says pressure on right side worse past few days. Also drainage from nose. No chest congestion. No fever, chillls.  At end of visit, also says he had problem with muscle cramps with prior statins. Says I Rxed Pravastain at LOV. Says he only took it about 2 weeks--then ahd to stop it b/c it also caused muscle cramps.  Is fasting. Wants to recheck lab.  Is on Niacin and ASA 81mg  QHS.      Home Meds:   Outpatient Prescriptions Prior to Visit  Medication Sig Dispense Refill  . Coenzyme Q10 (CO Q10) 100 MG CAPS Take 200 mg by mouth.    . pravastatin (PRAVACHOL) 20 MG tablet Take 1 tablet (20 mg total) by mouth daily. (Patient not taking: Reported on 04/20/2014) 30 tablet 1   No facility-administered medications prior to visit.    Allergies:  Allergies  Allergen Reactions  . Codeine   . Statins Other (See Comments)    Severe myalgias      Review of Systems: See HPI for pertinent ROS. All other ROS negative.    Physical Exam: Blood pressure 114/82, pulse 76, temperature 97.8 F (36.6 C), temperature source Oral, resp. rate 18, weight 157 lb (71.215 kg)., Body mass index is 26.54 kg/(m^2). General:  WNWD WM. Appears in no acute distress. HEENT: Normocephalic, atraumatic, eyes without discharge, sclera non-icteric, nares are without discharge. Bilateral auditory canals clear, TM's are without perforation.  Right TM appears dull compared to left. Right Maxiallary sinus with mild tenderness with percussion. Oral cavity moist, posterior pharynx without exudate, erythema, peritonsillar abscess.  Neck: Supple. No  thyromegaly. He reposrts ome tenderness with palpation of bialteral tonsillar nodes. They are not enlarged.  Lungs: Clear bilaterally to auscultation without wheezes, rales, or rhonchi. Breathing is unlabored. Heart: Regular rhythm. No murmurs, rubs, or gallops. Abdomen: Soft, non-tender, non-distended with normoactive bowel sounds. No hepatomegaly. No rebound/guarding. No obvious abdominal masses. Msk:  Strength and tone normal for age. Extremities/Skin: Warm and dry.  Neuro: Alert and oriented X 3. Moves all extremities spontaneously. Gait is normal. CNII-XII grossly in tact. Psych:  Responds to questions appropriately with a normal affect.     ASSESSMENT AND PLAN:  44 y.o. year old male with  1. Right maxillary sinusitis - amoxicillin-clavulanate (AUGMENTIN) 875-125 MG per tablet; Take 1 tablet by mouth 2 (two) times daily.  Dispense: 20 tablet; Refill: 0 - predniSONE (DELTASONE) 20 MG tablet; Take 3 daily for 2 days, then 2 daily for 2 days, then 1 daily for 2 days.  Dispense: 12 tablet; Refill: 0  2. Bacterial respiratory infection - amoxicillin-clavulanate (AUGMENTIN) 875-125 MG per tablet; Take 1 tablet by mouth 2 (two) times daily.  Dispense: 20 tablet; Refill: 0  3. Hyperlipidemia - Lipid panel - Hepatic function panel  4. Family history of premature coronary artery disease Father with CABG in his mid [address]50s   Signed, Mary Beth GrovelandDixon, [place], [facility] 04/20/2014 1:29 PM

## 2014-04-25 ENCOUNTER — Telehealth: Payer: Self-pay | Admitting: Family Medicine

## 2014-04-25 MED ORDER — EZETIMIBE 10 MG PO TABS: 10.0000 mg | ORAL_TABLET | Freq: Every day | ORAL | Status: DC

## 2014-04-25 NOTE — Telephone Encounter (Signed)
-----   Message from Dorena BodoMary B Dixon, PA-C sent at 04/21/2014  7:18 AM EST ----- Pt has had myalgias/ cramps with all statins. Has family h/o CAD.  Start Zetia 10mg  1 po QD # 30 + 5.  Tell him this causes no side effects. Just deceases absorption of cholesterol in intestine.

## 2014-04-25 NOTE — Telephone Encounter (Signed)
Pt aware of lab results and need for cholesterol medication.  Zetia RX to pharmacy.  Told pt this was not a statin and should not cause the myalgias that the statins did.  Still watch diet and can continue to take Niacin.  OV 6 month to follow up

## 2014-04-26 ENCOUNTER — Telehealth: Payer: Self-pay | Admitting: Physician Assistant

## 2014-04-26 NOTE — Telephone Encounter (Signed)
Patient is calling to say that the cholesterol medication we prescribed  him is too expensive, would like to know if we could call something else in  [phone] [facility] [address] [place]

## 2014-04-27 MED ORDER — EZETIMIBE 10 MG PO TABS: 10.0000 mg | ORAL_TABLET | Freq: Every day | ORAL | Status: DC

## 2014-04-27 NOTE — Telephone Encounter (Signed)
Pt called back.  Giving him voucher for 30 day free supply.  Told him to contact [employer] and see if he can get patient assistance.

## 2014-04-27 NOTE — Telephone Encounter (Signed)
He has reported muscle cramps with all statins. The only other cholesterol medications to use are Zetia or WelChol. I don't think he wants to use WelChol as you have to take 6 pills a day and this sometimes causes some GI side effects. He will have to decide whether or not he can pay for the Zetia-- but I have no other options.

## 2014-04-28 ENCOUNTER — Telehealth: Payer: Self-pay | Admitting: Physician Assistant

## 2014-04-28 NOTE — Telephone Encounter (Signed)
[phone] PT dropped a form off to be filled out in regards to some of his medication,  (zetia) and he is wanting to know how long this was going to be written out for.

## 2014-04-28 NOTE — Telephone Encounter (Signed)
Forms have been completed and are in the mail.  Pt aware

## 2014-05-09 ENCOUNTER — Ambulatory Visit (INDEPENDENT_AMBULATORY_CARE_PROVIDER_SITE_OTHER): Payer: Medicare Other | Admitting: Physician Assistant

## 2014-05-09 ENCOUNTER — Encounter: Payer: Self-pay | Admitting: Physician Assistant

## 2014-05-09 VITALS — BP 124/88 | HR 72 | Temp 97.4°F | Resp 18 | Wt 157.0 lb

## 2014-05-09 DIAGNOSIS — L200 Besnier's prurigo: Secondary | ICD-10-CM

## 2014-05-09 DIAGNOSIS — L239 Allergic contact dermatitis, unspecified cause: Secondary | ICD-10-CM

## 2014-05-09 MED ORDER — PREDNISONE 20 MG PO TABS: ORAL_TABLET | ORAL | Status: DC

## 2014-05-09 NOTE — Progress Notes (Signed)
    Patient ID: Jeremy Hancock MRN: [id number], DOB: 12/25/1970, 44 y.o. Date of Encounter: 05/09/2014, 10:53 AM    Chief Complaint:  Chief Complaint  Patient presents with  . poison oak     HPI: 44 y.o. year old white male says he has been highly allergic to poison oak in the past and thinks he has just recently gotten into some poison oak. Says that it is mostly on his left hand. However he's got some splotchy itchy areas on his upper back.     Home Meds:   Outpatient Prescriptions Prior to Visit  Medication Sig Dispense Refill  . aspirin 81 MG tablet Take 81 mg by mouth daily.    Marland Kitchen. ezetimibe (ZETIA) 10 MG tablet Take 1 tablet (10 mg total) by mouth daily. 30 tablet 0  . NIACIN PO Take 1 tablet by mouth daily.    . Coenzyme Q10 (CO Q10) 100 MG CAPS Take 200 mg by mouth.    Marland Kitchen. amoxicillin-clavulanate (AUGMENTIN) 875-125 MG per tablet Take 1 tablet by mouth 2 (two) times daily. 20 tablet 0  . predniSONE (DELTASONE) 20 MG tablet Take 3 daily for 2 days, then 2 daily for 2 days, then 1 daily for 2 days. 12 tablet 0   No facility-administered medications prior to visit.    Allergies:  Allergies  Allergen Reactions  . Amoxicillin Other (See Comments)    Severe abd pain  . Codeine   . Statins Other (See Comments)    Severe myalgias      Review of Systems: See HPI for pertinent ROS. All other ROS negative.    Physical Exam: Blood pressure 124/88, pulse 72, temperature 97.4 F (36.3 C), temperature source Oral, resp. rate 18, weight 157 lb (71.215 kg)., Body mass index is 26.54 kg/(m^2). General:  WNWD WM. Appears in no acute distress. Neck: Supple. No thyromegaly. No lymphadenopathy. Lungs: Clear bilaterally to auscultation without wheezes, rales, or rhonchi. Breathing is unlabored. Heart: Regular rhythm. No murmurs, rubs, or gallops. Msk:  Strength and tone normal for age. Extremities/Skin: Warm and dry. Left Hand--Dorsum--Covered with diffuse scabs, secondary to  excoriation.  Points to area on left thumb--says "this just popped up today"-- tiny papules/vessicles in linear distribution. Left upper flank, back: Tiny pink papules, scattered, over approx 3 inch area.  Neuro: Alert and oriented X 3. Moves all extremities spontaneously. Gait is normal. CNII-XII grossly in tact. Psych:  Responds to questions appropriately with a normal affect.     ASSESSMENT AND PLAN:  44 y.o. year old male with  1. Allergic dermatitis - predniSONE (DELTASONE) 20 MG tablet; Take 3 daily for 2 days, then 2 daily for 2 days, then 1 daily for 2 days.  Dispense: 12 tablet; Refill: 0 Start this prednisone taper today and taper as directed. Follow-up if rash does not resolve with this.  Signed, [address]Mary Beth RosevilleDixon, [place], [facility] 05/09/2014 10:53 AM

## 2014-06-23 ENCOUNTER — Ambulatory Visit (INDEPENDENT_AMBULATORY_CARE_PROVIDER_SITE_OTHER): Payer: Medicare Other | Admitting: Physician Assistant

## 2014-06-23 ENCOUNTER — Encounter: Payer: Self-pay | Admitting: Physician Assistant

## 2014-06-23 VITALS — BP 118/78 | HR 64 | Temp 97.5°F | Resp 20 | Wt 151.0 lb

## 2014-06-23 DIAGNOSIS — J309 Allergic rhinitis, unspecified: Secondary | ICD-10-CM

## 2014-06-23 MED ORDER — PREDNISONE 20 MG PO TABS: ORAL_TABLET | ORAL | Status: DC

## 2014-06-23 NOTE — Progress Notes (Signed)
    Patient ID: Jeremy Hancock MRN: [id number], DOB: 07/20/1970, 44 y.o. Date of Encounter: 06/23/2014, 4:04 PM    Chief Complaint:  Chief Complaint  Patient presents with  . allergy problems    OTC not working     HPI: 44 y.o. year old white male says that he has had a lot of congestion and pressure in his head. Across his for head and around his eyes as area of a lot of pressure and congestion. Is that it all feels stopped up. Says that nothing has come out of his nose at all. No clear rhinorrhea and no thick mucus. Has had no sore throat no cough no chest congestion. No fevers or chills. Use some over-the-counter Allegra with no change in symptoms.     Home Meds:   Outpatient Prescriptions Prior to Visit  Medication Sig Dispense Refill  . aspirin 81 MG tablet Take 81 mg by mouth daily.    Marland Kitchen. ezetimibe (ZETIA) 10 MG tablet Take 1 tablet (10 mg total) by mouth daily. 30 tablet 0  . Coenzyme Q10 (CO Q10) 100 MG CAPS Take 200 mg by mouth.    Marland Kitchen. NIACIN PO Take 1 tablet by mouth daily.    . predniSONE (DELTASONE) 20 MG tablet Take 3 daily for 2 days, then 2 daily for 2 days, then 1 daily for 2 days. 12 tablet 0   No facility-administered medications prior to visit.    Allergies:  Allergies  Allergen Reactions  . Amoxicillin Other (See Comments)    Severe abd pain  . Codeine   . Statins Other (See Comments)    Severe myalgias      Review of Systems: See HPI for pertinent ROS. All other ROS negative.    Physical Exam: Blood pressure 118/78, pulse 64, temperature 97.5 F (36.4 C), temperature source Oral, resp. rate 20, weight 151 lb (68.493 kg)., Body mass index is 25.53 kg/(m^2). General: WNWD WM.  Appears in no acute distress. HEENT: Normocephalic, atraumatic, eyes without discharge, sclera non-icteric, nares are without discharge. Bilateral auditory canals clear, TM's are without perforation, pearly grey and translucent with reflective cone of light bilaterally. Oral  cavity moist, posterior pharynx without exudate, erythema, peritonsillar abscess. Positive tenderness with percussion of frontal sinuses and some of the maxillary sinuses bilaterally.  Neck: Supple. No thyromegaly. No lymphadenopathy. Lungs: Clear bilaterally to auscultation without wheezes, rales, or rhonchi. Breathing is unlabored. Heart: Regular rhythm. No murmurs, rubs, or gallops. Msk:  Strength and tone normal for age. Extremities/Skin: Warm and dry. Neuro: Alert and oriented X 3. Moves all extremities spontaneously. Gait is normal. CNII-XII grossly in tact. Psych:  Responds to questions appropriately with a normal affect.     ASSESSMENT AND PLAN:  44 y.o. year old male with  1. Allergic sinusitis - predniSONE (DELTASONE) 20 MG tablet; Take 3 daily for 2 days, then 2 daily for 2 days, then 1 daily for 2 days.  Dispense: 12 tablet; Refill: 0 Take prednisone as directed. Follow-up if symptoms do not resolve with this.  Murray HodgkinsSigned, Odetta Forness Beth St. CharlesDixon, [place], [facility] 06/23/2014 4:04 PM

## 2014-06-27 ENCOUNTER — Telehealth: Payer: Self-pay | Admitting: Physician Assistant

## 2014-06-27 NOTE — Telephone Encounter (Signed)
lmtrc

## 2014-06-27 NOTE — Telephone Encounter (Signed)
Here on 3/24 for allergies.  Says is not feeling any better.  On last day of dose pack.  Told to call back if not better.

## 2014-06-27 NOTE — Telephone Encounter (Signed)
Patient calling to say that the prednisone that was prescribed by Shon Halemary beth is not working, would like to know if anything else can be called in  [phone] [facility] [address]

## 2014-06-28 MED ORDER — AZITHROMYCIN 250 MG PO TABS: ORAL_TABLET | ORAL | Status: DC

## 2014-06-28 MED ORDER — FLUTICASONE PROPIONATE 50 MCG/ACT NA SUSP: 2.0000 | Freq: Every day | NASAL | Status: DC

## 2014-06-28 NOTE — Telephone Encounter (Signed)
Begin zpack and NTBS if no better.  Also add flonase 2 sprays each nostril daily.

## 2014-06-28 NOTE — Telephone Encounter (Signed)
Rx's to pharmacy and pt aware.

## 2014-07-28 ENCOUNTER — Ambulatory Visit (INDEPENDENT_AMBULATORY_CARE_PROVIDER_SITE_OTHER): Payer: Medicare Other | Admitting: Physician Assistant

## 2014-07-28 ENCOUNTER — Encounter: Payer: Self-pay | Admitting: Physician Assistant

## 2014-07-28 VITALS — BP 120/68 | HR 68 | Temp 97.5°F | Resp 18 | Wt 159.0 lb

## 2014-07-28 DIAGNOSIS — H8110 Benign paroxysmal vertigo, unspecified ear: Secondary | ICD-10-CM

## 2014-07-28 DIAGNOSIS — Z8249 Family history of ischemic heart disease and other diseases of the circulatory system: Secondary | ICD-10-CM

## 2014-07-28 DIAGNOSIS — J029 Acute pharyngitis, unspecified: Secondary | ICD-10-CM

## 2014-07-28 DIAGNOSIS — J309 Allergic rhinitis, unspecified: Secondary | ICD-10-CM | POA: Diagnosis not present

## 2014-07-28 DIAGNOSIS — E785 Hyperlipidemia, unspecified: Secondary | ICD-10-CM | POA: Diagnosis not present

## 2014-07-28 LAB — COMPLETE METABOLIC PANEL WITH GFR
ALT: 33 U/L (ref 0–53)
AST: 22 U/L (ref 0–37)
Albumin: 4.1 g/dL (ref 3.5–5.2)
Alkaline Phosphatase: 69 U/L (ref 39–117)
BILIRUBIN TOTAL: 0.5 mg/dL (ref 0.2–1.2)
BUN: 9 mg/dL (ref 6–23)
CALCIUM: 9.1 mg/dL (ref 8.4–10.5)
CHLORIDE: 102 meq/L (ref 96–112)
CO2: 27 mEq/L (ref 19–32)
Creat: 0.84 mg/dL (ref 0.50–1.35)
GFR, Est Non African American: >89 mL/min
Glucose, Bld: 97 mg/dL (ref 70–99)
Potassium: 4 mEq/L (ref 3.5–5.3)
SODIUM: 138 meq/L (ref 135–145)
TOTAL PROTEIN: 6.4 g/dL (ref 6.0–8.3)

## 2014-07-28 LAB — LIPID PANEL
Cholesterol: 218 mg/dL — ABNORMAL HIGH (ref 0–200)
HDL: 33 mg/dL — AB (ref >=40)
LDL Cholesterol: 160 mg/dL — ABNORMAL HIGH (ref 0–99)
Total CHOL/HDL Ratio: 6.6 Ratio
Triglycerides: 124 mg/dL (ref <150)
VLDL: 25 mg/dL (ref 0–40)

## 2014-07-28 MED ORDER — PREDNISONE 20 MG PO TABS: ORAL_TABLET | ORAL | Status: DC
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
## 2014-08-31 ENCOUNTER — Emergency Department (HOSPITAL_COMMUNITY): Payer: Medicare HMO

## 2014-08-31 ENCOUNTER — Encounter (HOSPITAL_COMMUNITY): Payer: Self-pay

## 2014-08-31 ENCOUNTER — Emergency Department (HOSPITAL_COMMUNITY)
Admission: EM | Admit: 2014-08-31 | Discharge: 2014-08-31 | Disposition: A | Discharge status: Home / Self Care | Payer: Medicare HMO | Attending: Emergency Medicine | Admitting: Emergency Medicine

## 2014-08-31 DIAGNOSIS — Z87828 Personal history of other (healed) physical injury and trauma: Secondary | ICD-10-CM | POA: Insufficient documentation

## 2014-08-31 DIAGNOSIS — M542 Cervicalgia: Secondary | ICD-10-CM | POA: Insufficient documentation

## 2014-08-31 DIAGNOSIS — Z87891 Personal history of nicotine dependence: Secondary | ICD-10-CM | POA: Insufficient documentation

## 2014-08-31 DIAGNOSIS — Z8614 Personal history of Methicillin resistant Staphylococcus aureus infection: Secondary | ICD-10-CM | POA: Insufficient documentation

## 2014-08-31 DIAGNOSIS — R112 Nausea with vomiting, unspecified: Secondary | ICD-10-CM | POA: Diagnosis not present

## 2014-08-31 DIAGNOSIS — R5383 Other fatigue: Secondary | ICD-10-CM | POA: Diagnosis not present

## 2014-08-31 DIAGNOSIS — Z880 Allergy status to penicillin: Secondary | ICD-10-CM | POA: Insufficient documentation

## 2014-08-31 DIAGNOSIS — R51 Headache: Secondary | ICD-10-CM | POA: Insufficient documentation

## 2014-08-31 DIAGNOSIS — Z7982 Long term (current) use of aspirin: Secondary | ICD-10-CM | POA: Diagnosis not present

## 2014-08-31 DIAGNOSIS — M1990 Unspecified osteoarthritis, unspecified site: Secondary | ICD-10-CM | POA: Insufficient documentation

## 2014-08-31 DIAGNOSIS — E876 Hypokalemia: Secondary | ICD-10-CM

## 2014-08-31 DIAGNOSIS — R519 Headache, unspecified: Secondary | ICD-10-CM

## 2014-08-31 DIAGNOSIS — R42 Dizziness and giddiness: Secondary | ICD-10-CM | POA: Diagnosis not present

## 2014-08-31 DIAGNOSIS — R61 Generalized hyperhidrosis: Secondary | ICD-10-CM | POA: Diagnosis not present

## 2014-08-31 DIAGNOSIS — R001 Bradycardia, unspecified: Secondary | ICD-10-CM | POA: Insufficient documentation

## 2014-08-31 DIAGNOSIS — R531 Weakness: Secondary | ICD-10-CM | POA: Insufficient documentation

## 2014-08-31 DIAGNOSIS — Z79899 Other long term (current) drug therapy: Secondary | ICD-10-CM | POA: Insufficient documentation

## 2014-08-31 LAB — CBC WITH DIFFERENTIAL/PLATELET
Basophils Absolute: 0 10*3/uL (ref 0.0–0.1)
Basophils Relative: 1 % (ref 0–1)
Eosinophils Absolute: 0.3 10*3/uL (ref 0.0–0.7)
Eosinophils Relative: 4 % (ref 0–5)
HCT: 44.6 % (ref 39.0–52.0)
Hemoglobin: 15.3 g/dL (ref 13.0–17.0)
Lymphocytes Relative: 31 % (ref 12–46)
Lymphs Abs: 2 10*3/uL (ref 0.7–4.0)
MCH: 29.5 pg (ref 26.0–34.0)
MCHC: 34.3 g/dL (ref 30.0–36.0)
MCV: 86.1 fL (ref 78.0–100.0)
Monocytes Absolute: 0.4 10*3/uL (ref 0.1–1.0)
Monocytes Relative: 6 % (ref 3–12)
Neutro Abs: 3.9 10*3/uL (ref 1.7–7.7)
Neutrophils Relative %: 58 % (ref 43–77)
Platelets: 262 10*3/uL (ref 150–400)
RBC: 5.18 MIL/uL (ref 4.22–5.81)
RDW: 12.7 % (ref 11.5–15.5)
WBC: 6.6 10*3/uL (ref 4.0–10.5)

## 2014-08-31 LAB — BASIC METABOLIC PANEL
Anion gap: 11 (ref 5–15)
BUN: 7 mg/dL (ref 6–20)
CO2: 23 mmol/L (ref 22–32)
Calcium: 9.3 mg/dL (ref 8.9–10.3)
Chloride: 105 mmol/L (ref 101–111)
Creatinine, Ser: 0.99 mg/dL (ref 0.61–1.24)
GFR calc Af Amer: >60 mL/min (ref >60)
GFR calc non Af Amer: >60 mL/min (ref >60)
Glucose, Bld: 103 mg/dL — ABNORMAL HIGH (ref 65–99)
Potassium: 3.3 mmol/L — ABNORMAL LOW (ref 3.5–5.1)
Sodium: 139 mmol/L (ref 135–145)

## 2014-08-31 LAB — I-STAT TROPONIN, ED: Troponin i, poc: 0 ng/mL (ref 0.00–0.08)

## 2014-08-31 LAB — CBG MONITORING, ED: GLUCOSE-CAPILLARY: 104 mg/dL — AB (ref 65–99)

## 2014-08-31 MED ORDER — SODIUM CHLORIDE 0.9 % IV BOLUS (SEPSIS)
1000.0000 mL | Freq: Once | INTRAVENOUS | Status: AC
  Administered 2014-08-31: 1000 mL via INTRAVENOUS

## 2014-08-31 MED ORDER — LORAZEPAM 2 MG/ML IJ SOLN
1.0000 mg | Freq: Once | INTRAMUSCULAR | Status: AC
  Administered 2014-08-31: 1 mg via INTRAVENOUS
  Filled 2014-08-31: qty 1

## 2014-08-31 MED ORDER — ONDANSETRON HCL 4 MG/2ML IJ SOLN
4.0000 mg | Freq: Once | INTRAMUSCULAR | Status: AC
  Administered 2014-08-31: 4 mg via INTRAVENOUS
  Filled 2014-08-31: qty 2

## 2014-08-31 MED ORDER — POTASSIUM CHLORIDE CRYS ER 20 MEQ PO TBCR
40.0000 meq | EXTENDED_RELEASE_TABLET | Freq: Once | ORAL | Status: AC
  Administered 2014-08-31: 40 meq via ORAL
  Filled 2014-08-31: qty 2

## 2014-08-31 MED ORDER — MECLIZINE HCL 25 MG PO TABS
25.0000 mg | ORAL_TABLET | Freq: Once | ORAL | Status: AC
Start: 2014-08-31 — End: 2014-08-31
  Administered 2014-08-31: 25 mg via ORAL
  Filled 2014-08-31: qty 1

## 2014-08-31 NOTE — ED Notes (Signed)
Pt. Left with all belongings and refused wheelchair 
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
## 2014-08-31 NOTE — ED Notes (Signed)
[facility]- pt coming from PCP with dizziness X1 year. Pt lying flat then standing up at PCP and became very dizzy. Vital signs stable with EMS.

## 2014-08-31 NOTE — Discharge Instructions (Signed)
Be sure to take the medication meclizine (Antivert) as prescribed by Dr. Suszanne Connerseoh, ENT.  See below for further instructions

## 2014-08-31 NOTE — ED Notes (Signed)
Patient transported to MRI 

## 2014-08-31 NOTE — ED Notes (Signed)
Patient transported to CT 

## 2014-09-06 DIAGNOSIS — M21371 Foot drop, right foot: Secondary | ICD-10-CM | POA: Insufficient documentation

## 2014-11-05 ENCOUNTER — Emergency Department (HOSPITAL_COMMUNITY): Payer: Medicare Other

## 2014-11-05 ENCOUNTER — Encounter (HOSPITAL_COMMUNITY): Payer: Self-pay | Admitting: Family Medicine

## 2014-11-05 ENCOUNTER — Emergency Department (HOSPITAL_COMMUNITY)
Admission: EM | Admit: 2014-11-05 | Discharge: 2014-11-05 | Disposition: A | Discharge status: Home / Self Care | Payer: Medicare Other | Attending: Emergency Medicine | Admitting: Emergency Medicine

## 2014-11-05 ENCOUNTER — Emergency Department (HOSPITAL_COMMUNITY): Payer: Self-pay

## 2014-11-05 DIAGNOSIS — Z880 Allergy status to penicillin: Secondary | ICD-10-CM | POA: Insufficient documentation

## 2014-11-05 DIAGNOSIS — E780 Pure hypercholesterolemia: Secondary | ICD-10-CM | POA: Insufficient documentation

## 2014-11-05 DIAGNOSIS — T148XXA Other injury of unspecified body region, initial encounter: Secondary | ICD-10-CM

## 2014-11-05 DIAGNOSIS — S40811A Abrasion of right upper arm, initial encounter: Secondary | ICD-10-CM | POA: Insufficient documentation

## 2014-11-05 DIAGNOSIS — Z7982 Long term (current) use of aspirin: Secondary | ICD-10-CM | POA: Insufficient documentation

## 2014-11-05 DIAGNOSIS — R519 Headache, unspecified: Secondary | ICD-10-CM

## 2014-11-05 DIAGNOSIS — M1990 Unspecified osteoarthritis, unspecified site: Secondary | ICD-10-CM | POA: Insufficient documentation

## 2014-11-05 DIAGNOSIS — IMO0002 Reserved for concepts with insufficient information to code with codable children: Secondary | ICD-10-CM

## 2014-11-05 DIAGNOSIS — R51 Headache: Secondary | ICD-10-CM

## 2014-11-05 DIAGNOSIS — S79911A Unspecified injury of right hip, initial encounter: Secondary | ICD-10-CM | POA: Insufficient documentation

## 2014-11-05 DIAGNOSIS — Y998 Other external cause status: Secondary | ICD-10-CM | POA: Insufficient documentation

## 2014-11-05 DIAGNOSIS — Z79899 Other long term (current) drug therapy: Secondary | ICD-10-CM | POA: Insufficient documentation

## 2014-11-05 DIAGNOSIS — M25551 Pain in right hip: Secondary | ICD-10-CM

## 2014-11-05 DIAGNOSIS — Y9389 Activity, other specified: Secondary | ICD-10-CM | POA: Insufficient documentation

## 2014-11-05 DIAGNOSIS — Z8614 Personal history of Methicillin resistant Staphylococcus aureus infection: Secondary | ICD-10-CM | POA: Insufficient documentation

## 2014-11-05 DIAGNOSIS — S0990XA Unspecified injury of head, initial encounter: Secondary | ICD-10-CM | POA: Insufficient documentation

## 2014-11-05 DIAGNOSIS — Y92410 Unspecified street and highway as the place of occurrence of the external cause: Secondary | ICD-10-CM | POA: Insufficient documentation

## 2014-11-05 DIAGNOSIS — S41111A Laceration without foreign body of right upper arm, initial encounter: Secondary | ICD-10-CM | POA: Insufficient documentation

## 2014-11-05 DIAGNOSIS — Z87891 Personal history of nicotine dependence: Secondary | ICD-10-CM | POA: Insufficient documentation

## 2014-11-05 DIAGNOSIS — T148 Other injury of unspecified body region: Secondary | ICD-10-CM | POA: Insufficient documentation

## 2014-11-05 DIAGNOSIS — Z7951 Long term (current) use of inhaled steroids: Secondary | ICD-10-CM | POA: Insufficient documentation

## 2014-11-05 HISTORY — DX: Pure hypercholesterolemia, unspecified: E78.00

## 2014-11-05 MED ORDER — OXYCODONE-ACETAMINOPHEN 5-325 MG PO TABS
2.0000 | ORAL_TABLET | Freq: Once | ORAL | Status: AC
  Administered 2014-11-05: 2 via ORAL
  Filled 2014-11-05: qty 2

## 2014-11-05 MED ORDER — LIDOCAINE HCL 2 % IJ SOLN
10.0000 mL | Freq: Once | INTRAMUSCULAR | Status: AC
  Administered 2014-11-05: 200 mg
  Filled 2014-11-05: qty 20

## 2014-11-05 MED ORDER — OXYCODONE-ACETAMINOPHEN 5-325 MG PO TABS: 1.0000 | ORAL_TABLET | Freq: Four times a day (QID) | ORAL | Status: DC | PRN
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
## 2014-11-05 NOTE — Discharge Instructions (Signed)
Have the staples removed in 7-10 days Abrasion An abrasion is a cut or scrape of the skin. Abrasions do not extend through all layers of the skin and most heal within 10 days. It is important to care for your abrasion properly to prevent infection. CAUSES  Most abrasions are caused by falling on, or gliding across, the ground or other surface. When your skin rubs on something, the outer and inner layer of skin rubs off, causing an abrasion. DIAGNOSIS  Your caregiver will be able to diagnose an abrasion during a physical exam.  TREATMENT  Your treatment depends on how large and deep the abrasion is. Generally, your abrasion will be cleaned with water and a mild soap to remove any dirt or debris. An antibiotic ointment may be put over the abrasion to prevent an infection. A bandage (dressing) may be wrapped around the abrasion to keep it from getting dirty.  You may need a tetanus shot if:  You cannot remember when you had your last tetanus shot.  You have never had a tetanus shot.  The injury broke your skin. If you get a tetanus shot, your arm may swell, get red, and feel warm to the touch. This is common and not a problem. If you need a tetanus shot and you choose not to have one, there is a rare chance of getting tetanus. Sickness from tetanus can be serious.  HOME CARE INSTRUCTIONS   If a dressing was applied, change it at least once a day or as directed by your caregiver. If the bandage sticks, soak it off with warm water.   Wash the area with water and a mild soap to remove all the ointment 2 times a day. Rinse off the soap and pat the area dry with a clean towel.   Reapply any ointment as directed by your caregiver. This will help prevent infection and keep the bandage from sticking. Use gauze over the wound and under the dressing to help keep the bandage from sticking.   Change your dressing right away if it becomes wet or dirty.   Only take over-the-counter or prescription  medicines for pain, discomfort, or fever as directed by your caregiver.   Follow up with your caregiver within 24-48 hours for a wound check, or as directed. If you were not given a wound-check appointment, look closely at your abrasion for redness, swelling, or pus. These are signs of infection. SEEK IMMEDIATE MEDICAL CARE IF:   You have increasing pain in the wound.   You have redness, swelling, or tenderness around the wound.   You have pus coming from the wound.   You have a fever or persistent symptoms for more than 2-3 days.  You have a fever and your symptoms suddenly get worse.  You have a bad smell coming from the wound or dressing.  MAKE SURE YOU:   Understand these instructions.  Will watch your condition.  Will get help right away if you are not doing well or get worse. Document Released: 12/26/2004 Document Revised: 03/04/2012 Document Reviewed: 02/19/2011 [facility] Patient Information 2015 [place], [place]. This information is not intended to replace advice given to you by your health care provider. Make sure you discuss any questions you have with your health care provider.  Laceration Care, Adult A laceration is a cut or lesion that goes through all layers of the skin and into the tissue just beneath the skin. TREATMENT  Some lacerations may not require closure. Some lacerations may not  be able to be closed due to an increased risk of infection. It is important to see your caregiver as soon as possible after an injury to minimize the risk of infection and maximize the opportunity for successful closure. If closure is appropriate, pain medicines may be given, if needed. The wound will be cleaned to help prevent infection. Your caregiver will use stitches (sutures), staples, wound glue (adhesive), or skin adhesive strips to repair the laceration. These tools bring the skin edges together to allow for faster healing and a better cosmetic outcome. However, all wounds will  heal with a scar. Once the wound has healed, scarring can be minimized by covering the wound with sunscreen during the day for 1 full year. HOME CARE INSTRUCTIONS  For sutures or staples:  Keep the wound clean and dry.  If you were given a bandage (dressing), you should change it at least once a day. Also, change the dressing if it becomes wet or dirty, or as directed by your caregiver.  Wash the wound with soap and water 2 times a day. Rinse the wound off with water to remove all soap. Pat the wound dry with a clean towel.  After cleaning, apply a thin layer of the antibiotic ointment as recommended by your caregiver. This will help prevent infection and keep the dressing from sticking.  You may shower as usual after the first 24 hours. Do not soak the wound in water until the sutures are removed.  Only take over-the-counter or prescription medicines for pain, discomfort, or fever as directed by your caregiver.  Get your sutures or staples removed as directed by your caregiver. For skin adhesive strips:  Keep the wound clean and dry.  Do not get the skin adhesive strips wet. You may bathe carefully, using caution to keep the wound dry.  If the wound gets wet, pat it dry with a clean towel.  Skin adhesive strips will fall off on their own. You may trim the strips as the wound heals. Do not remove skin adhesive strips that are still stuck to the wound. They will fall off in time. For wound adhesive:  You may briefly wet your wound in the shower or bath. Do not soak or scrub the wound. Do not swim. Avoid periods of heavy perspiration until the skin adhesive has fallen off on its own. After showering or bathing, gently pat the wound dry with a clean towel.  Do not apply liquid medicine, cream medicine, or ointment medicine to your wound while the skin adhesive is in place. This may loosen the film before your wound is healed.  If a dressing is placed over the wound, be careful not to  apply tape directly over the skin adhesive. This may cause the adhesive to be pulled off before the wound is healed.  Avoid prolonged exposure to sunlight or tanning lamps while the skin adhesive is in place. Exposure to ultraviolet light in the first year will darken the scar.  The skin adhesive will usually remain in place for 5 to 10 days, then naturally fall off the skin. Do not pick at the adhesive film. You may need a tetanus shot if:  You cannot remember when you had your last tetanus shot.  You have never had a tetanus shot. If you get a tetanus shot, your arm may swell, get red, and feel warm to the touch. This is common and not a problem. If you need a tetanus shot and you choose not to  have one, there is a rare chance of getting tetanus. Sickness from tetanus can be serious. SEEK MEDICAL CARE IF:   You have redness, swelling, or increasing pain in the wound.  You see a red line that goes away from the wound.  You have yellowish-white fluid (pus) coming from the wound.  You have a fever.  You notice a bad smell coming from the wound or dressing.  Your wound breaks open before or after sutures have been removed.  You notice something coming out of the wound such as wood or glass.  Your wound is on your hand or foot and you cannot move a finger or toe. SEEK IMMEDIATE MEDICAL CARE IF:   Your pain is not controlled with prescribed medicine.  You have severe swelling around the wound causing pain and numbness or a change in color in your arm, hand, leg, or foot.  Your wound splits open and starts bleeding.  You have worsening numbness, weakness, or loss of function of any joint around or beyond the wound.  You develop painful lumps near the wound or on the skin anywhere on your body. MAKE SURE YOU:   Understand these instructions.  Will watch your condition.  Will get help right away if you are not doing well or get worse. Document Released: 03/18/2005 Document  Revised: 06/10/2011 Document Reviewed: 09/11/2010 [facility] Patient Information 2015 [place], [place]. This information is not intended to replace advice given to you by your health care provider. Make sure you discuss any questions you have with your health care provider.

## 2014-11-05 NOTE — ED Notes (Signed)
Provider stapled right upper arm wound placed bacitracin 4x4 gauze and kerlex. Patient tolerated without incident.
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
## 2014-11-10 ENCOUNTER — Emergency Department (HOSPITAL_COMMUNITY)
Admission: EM | Admit: 2014-11-10 | Discharge: 2014-11-10 | Disposition: A | Discharge status: Home / Self Care | Payer: Medicare Other | Attending: Emergency Medicine | Admitting: Emergency Medicine

## 2014-11-10 ENCOUNTER — Encounter (HOSPITAL_COMMUNITY): Payer: Self-pay | Admitting: Emergency Medicine

## 2014-11-10 DIAGNOSIS — Z880 Allergy status to penicillin: Secondary | ICD-10-CM | POA: Insufficient documentation

## 2014-11-10 DIAGNOSIS — E780 Pure hypercholesterolemia: Secondary | ICD-10-CM | POA: Insufficient documentation

## 2014-11-10 DIAGNOSIS — Z79899 Other long term (current) drug therapy: Secondary | ICD-10-CM | POA: Insufficient documentation

## 2014-11-10 DIAGNOSIS — Z8614 Personal history of Methicillin resistant Staphylococcus aureus infection: Secondary | ICD-10-CM | POA: Insufficient documentation

## 2014-11-10 DIAGNOSIS — M1990 Unspecified osteoarthritis, unspecified site: Secondary | ICD-10-CM | POA: Insufficient documentation

## 2014-11-10 DIAGNOSIS — S41101D Unspecified open wound of right upper arm, subsequent encounter: Secondary | ICD-10-CM | POA: Insufficient documentation

## 2014-11-10 DIAGNOSIS — Z87891 Personal history of nicotine dependence: Secondary | ICD-10-CM | POA: Insufficient documentation

## 2014-11-10 DIAGNOSIS — Z7982 Long term (current) use of aspirin: Secondary | ICD-10-CM | POA: Insufficient documentation

## 2014-11-10 DIAGNOSIS — Z7951 Long term (current) use of inhaled steroids: Secondary | ICD-10-CM | POA: Insufficient documentation

## 2014-11-10 NOTE — ED Notes (Signed)
Patient insistent on having the area re-stapled.   Patient states the staples fell out over 24 hours ago.

## 2014-11-10 NOTE — Discharge Instructions (Signed)
Dressing changes with bacitracin twice a day. Follow up with primary care doctor for recheck.   Wound Care Wound care helps prevent pain and infection.  You may need a tetanus shot if:  You cannot remember when you had your last tetanus shot.  You have never had a tetanus shot.  The injury broke your skin. If you need a tetanus shot and you choose not to have one, you may get tetanus. Sickness from tetanus can be serious. HOME CARE   Only take medicine as told by your doctor.  Clean the wound daily with mild soap and water.  Change any bandages (dressings) as told by your doctor.  Put medicated cream and a bandage on the wound as told by your doctor.  Change the bandage if it gets wet, dirty, or starts to smell.  Take showers. Do not take baths, swim, or do anything that puts your wound under water.  Rest and raise (elevate) the wound until the pain and puffiness (swelling) are better.  Keep all doctor visits as told. GET HELP RIGHT AWAY IF:   Yellowish-white fluid (pus) comes from the wound.  Medicine does not lessen your pain.  There is a red streak going away from the wound.  You have a fever. MAKE SURE YOU:   Understand these instructions.  Will watch your condition.  Will get help right away if you are not doing well or get worse. Document Released: 12/26/2007 Document Revised: 06/10/2011 Document Reviewed: 07/22/2010 [facility] Patient Information 2015 [place], [place]. This information is not intended to replace advice given to you by your health care provider. Make sure you discuss any questions you have with your health care provider.
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
## 2014-11-17 ENCOUNTER — Ambulatory Visit: Payer: Medicare Other | Admitting: Family Medicine

## 2014-12-07 ENCOUNTER — Ambulatory Visit (INDEPENDENT_AMBULATORY_CARE_PROVIDER_SITE_OTHER): Payer: Medicare HMO | Admitting: Diagnostic Neuroimaging

## 2014-12-07 ENCOUNTER — Encounter: Payer: Self-pay | Admitting: Diagnostic Neuroimaging

## 2014-12-07 VITALS — BP 123/79 | HR 67 | Ht 65.0 in | Wt 147.2 lb

## 2014-12-07 DIAGNOSIS — G43109 Migraine with aura, not intractable, without status migrainosus: Secondary | ICD-10-CM

## 2014-12-07 MED ORDER — RIZATRIPTAN BENZOATE 10 MG PO TBDP: 10.0000 mg | ORAL_TABLET | ORAL | Status: DC | PRN

## 2014-12-07 MED ORDER — TOPIRAMATE 50 MG PO TABS: 50.0000 mg | ORAL_TABLET | Freq: Two times a day (BID) | ORAL | Status: DC
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
## 2014-12-07 NOTE — Patient Instructions (Addendum)
Slowly reduce Mountain Dew intake. Try to reduce to 1 cup per day over next 1 month. Then stop [facility]. Drink water instead.  Start topiramate  at bedtime. After 2 weeks increase to  twice a day.  Use rizatriptan as needed for breakthrough migraines. Take 1 tab at start of migraine, and may repeat x 1 after 2 hours. Maximum 2 tabs per day or 8 tabs per month.    GENERAL BRAIN HEALTH ADVICE  Focus on mental, social/emotional and physical stimulation. Try learning new activities (arts, music, language, games).  Move your body often!  Eat more plants, vegetables, nuts, seeds, berries (blueberry, blackberry, raspberry).  Eat less processed, fried, salty and sugary foods.   Try to get at least 7-8+ hours sleep per day.

## 2014-12-28 ENCOUNTER — Ambulatory Visit (INDEPENDENT_AMBULATORY_CARE_PROVIDER_SITE_OTHER): Payer: Medicare HMO | Admitting: Physician Assistant

## 2014-12-28 ENCOUNTER — Encounter: Payer: Self-pay | Admitting: Physician Assistant

## 2014-12-28 VITALS — BP 132/86 | HR 64 | Temp 98.0°F | Resp 18 | Wt 145.0 lb

## 2014-12-28 DIAGNOSIS — R42 Dizziness and giddiness: Secondary | ICD-10-CM | POA: Diagnosis not present

## 2014-12-28 DIAGNOSIS — L239 Allergic contact dermatitis, unspecified cause: Secondary | ICD-10-CM

## 2014-12-28 DIAGNOSIS — L200 Besnier's prurigo: Secondary | ICD-10-CM | POA: Diagnosis not present

## 2014-12-28 MED ORDER — METHYLPREDNISOLONE ACETATE 80 MG/ML IJ SUSP
60.0000 mg | Freq: Once | INTRAMUSCULAR | Status: AC
  Administered 2014-12-28: 60 mg via INTRAMUSCULAR

## 2014-12-28 NOTE — Progress Notes (Signed)
Patient ID: Jeremy Hancock MRN: [id number], DOB: [birth year], 44 y.o. Date of Encounter: 12/28/2014, 11:53 AM    Chief Complaint:  Chief Complaint  Patient presents with  . poison on rt hand,very painful  . discuss neuro problems     HPI: 44 y.o. year old white male states that he got into some poison oak poison ivy on his right hand several days ago. His right hand is extremely itchy and uncomfortable and now is developing itchy rash up the right arm. That the entire right hand and right arm are extremely itchy and very uncomfortable. Is applied topical with no relief.  Says that he saw Dr. Marjory Lies at [facility]. Says that he has taken the Topamax and medications that he directed but symptoms did not improve. He is wanting to have follow-up evaluation with a different doctor. Says that he knows that something is wrong and is very concerned but wants to see someone different.     Home Meds:   Outpatient Prescriptions Prior to Visit  Medication Sig Dispense Refill  . aspirin 81 MG tablet Take 81 mg by mouth daily.    Marland Kitchen ezetimibe (ZETIA) 10 MG tablet Take 1 tablet (10 mg total) by mouth daily. 30 tablet 0  . NIACIN PO Take 1 tablet by mouth daily.    . fluticasone (FLONASE) 50 MCG/ACT nasal spray Place 2 sprays into both nostrils daily. (Patient not taking: Reported on 08/31/2014) 16 g 3  . oxyCODONE-acetaminophen (PERCOCET/ROXICET) 5-325 MG per tablet Take 1-2 tablets by mouth every 6 (six) hours as needed for severe pain. (Patient not taking: Reported on 12/07/2014) 15 tablet 0  . rizatriptan (MAXALT-MLT) 10 MG disintegrating tablet Take 1 tablet (10 mg total) by mouth as needed for migraine. May repeat in 2 hours if needed (Patient not taking: Reported on 12/28/2014) 9 tablet 11  . topiramate (TOPAMAX) 50 MG tablet Take 1 tablet (50 mg total) by mouth 2 (two) times daily. (Patient not taking: Reported on 12/28/2014) 60 tablet 12   No facility-administered medications prior to  visit.    Allergies:  Allergies  Allergen Reactions  . Amoxicillin Other (See Comments)    Severe abd pain  . Codeine   . Hydrocodone-Acetaminophen Hives  . Statins Other (See Comments)    Severe myalgias      Review of Systems: See HPI for pertinent ROS. All other ROS negative.    Physical Exam: Blood pressure 132/86, pulse 64, temperature 98 F (36.7 C), temperature source Oral, resp. rate 18, weight 145 lb (65.772 kg)., Body mass index is 24.13 kg/(m^2). General:  WNWD WM. Appears in no acute distress. Neck: Supple. No thyromegaly. No lymphadenopathy. Lungs: Clear bilaterally to auscultation without wheezes, rales, or rhonchi. Breathing is unlabored. Heart: Regular rhythm. No murmurs, rubs, or gallops. Msk:  Strength and tone normal for age. Extremities/Skin: Right Hand: Small vesicles on fingers and hand. Patches of grouped vesicles/papules scattered on the right forearm and upper arm. No other areas of rash. Neuro: Alert and oriented X 3. Moves all extremities spontaneously. Gait is normal. CNII-XII grossly in tact. Psych:  Responds to questions appropriately with a normal affect.     ASSESSMENT AND PLAN:  44 y.o. year old male with   1. Allergic dermatitis He requests an injection rather than oral taper. Give Depo-Medrol 60 mg IM now. Follow-up if itching and rash do not resolve.  2. Dizziness - Ambulatory referral to Neurology Added a comment  under this referral regarding the  fact that he wants to see a different neurologist for follow-up of this problem. 3. Vertigo - Ambulatory referral to Neurology   Signed, [facility] [place], [place], [facility] 12/28/2014 11:53 AM

## 2014-12-28 NOTE — Addendum Note (Signed)
Addended by: Donne Anon on: 12/28/2014 12:23 PM   Modules accepted: Orders

## 2015-01-30 ENCOUNTER — Ambulatory Visit: Payer: Medicare Other | Admitting: Physician Assistant

## 2015-03-20 ENCOUNTER — Ambulatory Visit: Payer: Medicare HMO | Admitting: Diagnostic Neuroimaging

## 2015-03-21 ENCOUNTER — Encounter: Payer: Self-pay | Admitting: Diagnostic Neuroimaging

## 2015-04-26 ENCOUNTER — Encounter: Payer: Self-pay | Admitting: Physician Assistant

## 2015-04-26 ENCOUNTER — Other Ambulatory Visit: Payer: Self-pay | Admitting: Physician Assistant

## 2015-04-26 ENCOUNTER — Ambulatory Visit (INDEPENDENT_AMBULATORY_CARE_PROVIDER_SITE_OTHER): Payer: Medicare HMO | Admitting: Physician Assistant

## 2015-04-26 VITALS — BP 100/80 | HR 80 | Temp 98.1°F | Resp 20 | Wt 139.0 lb

## 2015-04-26 DIAGNOSIS — W57XXXA Bitten or stung by nonvenomous insect and other nonvenomous arthropods, initial encounter: Secondary | ICD-10-CM

## 2015-04-26 DIAGNOSIS — IMO0001 Reserved for inherently not codable concepts without codable children: Secondary | ICD-10-CM

## 2015-04-26 DIAGNOSIS — T148 Other injury of unspecified body region: Secondary | ICD-10-CM

## 2015-04-26 DIAGNOSIS — R197 Diarrhea, unspecified: Secondary | ICD-10-CM

## 2015-04-26 DIAGNOSIS — M609 Myositis, unspecified: Secondary | ICD-10-CM | POA: Diagnosis not present

## 2015-04-26 DIAGNOSIS — E785 Hyperlipidemia, unspecified: Secondary | ICD-10-CM | POA: Diagnosis not present

## 2015-04-26 DIAGNOSIS — M791 Myalgia: Secondary | ICD-10-CM | POA: Diagnosis not present

## 2015-04-26 LAB — CBC WITH DIFFERENTIAL/PLATELET
BASOS ABS: 0.1 10*3/uL (ref 0.0–0.1)
BASOS PCT: 1 % (ref 0–1)
Eosinophils Absolute: 0.3 10*3/uL (ref 0.0–0.7)
Eosinophils Relative: 6 % — ABNORMAL HIGH (ref 0–5)
HCT: 49.1 % (ref 39.0–52.0)
Hemoglobin: 16.7 g/dL (ref 13.0–17.0)
Lymphocytes Relative: 18 % (ref 12–46)
Lymphs Abs: 1 10*3/uL (ref 0.7–4.0)
MCH: 30.5 pg (ref 26.0–34.0)
MCHC: 34 g/dL (ref 30.0–36.0)
MCV: 89.6 fL (ref 78.0–100.0)
MONO ABS: 0.5 10*3/uL (ref 0.1–1.0)
MPV: 9.1 fL (ref 8.6–12.4)
Monocytes Relative: 9 % (ref 3–12)
NEUTROS ABS: 3.8 10*3/uL (ref 1.7–7.7)
NEUTROS PCT: 66 % (ref 43–77)
Platelets: 231 10*3/uL (ref 150–400)
RBC: 5.48 MIL/uL (ref 4.22–5.81)
RDW: 14.1 % (ref 11.5–15.5)
WBC: 5.8 10*3/uL (ref 4.0–10.5)

## 2015-04-26 LAB — COMPLETE METABOLIC PANEL WITH GFR
ALK PHOS: 78 U/L (ref 40–115)
ALT: 20 U/L (ref 9–46)
AST: 18 U/L (ref 10–40)
Albumin: 3.8 g/dL (ref 3.6–5.1)
BILIRUBIN TOTAL: 0.3 mg/dL (ref 0.2–1.2)
BUN: 8 mg/dL (ref 7–25)
CALCIUM: 9 mg/dL (ref 8.6–10.3)
CO2: 27 mmol/L (ref 20–31)
CREATININE: 0.96 mg/dL (ref 0.60–1.35)
Chloride: 102 mmol/L (ref 98–110)
GFR, Est African American: >89 mL/min (ref >=60)
GFR, Est Non African American: >89 mL/min (ref >=60)
GLUCOSE: 101 mg/dL — AB (ref 70–99)
POTASSIUM: 4 mmol/L (ref 3.5–5.3)
SODIUM: 138 mmol/L (ref 135–146)
TOTAL PROTEIN: 6.6 g/dL (ref 6.1–8.1)

## 2015-04-26 MED ORDER — DOXYCYCLINE HYCLATE 100 MG PO TABS: 100.0000 mg | ORAL_TABLET | Freq: Two times a day (BID) | ORAL | Status: DC

## 2015-04-26 NOTE — Progress Notes (Signed)
Patient ID: Jeremy Hancock MRN: [id number], DOB: [birth year], 45 y.o. Date of Encounter: 04/26/2015, 10:27 AM    Chief Complaint:  Chief Complaint  Patient presents with  . OTHER    had tick bite found on friday and has symptoms of being hot sweating clammy on Sunday, and stomach nausea and diarrhea and body aches      HPI: 45 y.o. year old white male says that he found a tic on his right abdomen on Friday 04/21/15. Says that it was really embedded and he really had to pull to get it out. Says that Saturday he felt perfectly fine. Says that Sunday 04/23/15 he barely got out of bed. Had diffuse body aches over his legs arms back etc. Says that last night he developed diarrhea and is continued with diarrhea today "like water "every 20-30 minutes even though not eating anything."  Has had no rash. No fever. No vomiting. But does feel like he has "heartburn "/acid reflux.     Home Meds:   Outpatient Prescriptions Prior to Visit  Medication Sig Dispense Refill  . aspirin 81 MG tablet Take 81 mg by mouth daily.    Marland Kitchen ezetimibe (ZETIA) 10 MG tablet Take 1 tablet (10 mg total) by mouth daily. 30 tablet 0  . fluticasone (FLONASE) 50 MCG/ACT nasal spray Place 2 sprays into both nostrils daily. (Patient not taking: Reported on 08/31/2014) 16 g 3  . NIACIN PO Take 1 tablet by mouth daily.    Marland Kitchen oxyCODONE-acetaminophen (PERCOCET/ROXICET) 5-325 MG per tablet Take 1-2 tablets by mouth every 6 (six) hours as needed for severe pain. (Patient not taking: Reported on 12/07/2014) 15 tablet 0  . rizatriptan (MAXALT-MLT) 10 MG disintegrating tablet Take 1 tablet (10 mg total) by mouth as needed for migraine. May repeat in 2 hours if needed (Patient not taking: Reported on 12/28/2014) 9 tablet 11  . topiramate (TOPAMAX) 50 MG tablet Take 1 tablet (50 mg total) by mouth 2 (two) times daily. (Patient not taking: Reported on 12/28/2014) 60 tablet 12   No facility-administered medications prior to visit.     Allergies:  Allergies  Allergen Reactions  . Amoxicillin Other (See Comments)    Severe abd pain  . Codeine   . Hydrocodone-Acetaminophen Hives  . Statins Other (See Comments)    Severe myalgias      Review of Systems: See HPI for pertinent ROS. All other ROS negative.    Physical Exam: Blood pressure 100/80, pulse 80, temperature 98.1 F (36.7 C), temperature source Oral, resp. rate 20, weight 139 lb (63.05 kg)., Body mass index is 23.13 kg/(m^2). General:  WNWD WM. NonToxic appearance. Appears in no acute distress. Neck: Supple. No thyromegaly. No lymphadenopathy. Lungs: Clear bilaterally to auscultation without wheezes, rales, or rhonchi. Breathing is unlabored. Heart: Regular rhythm. No murmurs, rubs, or gallops. Abdomen: Soft, non-tender, non-distended with normoactive bowel sounds. No hepatomegaly. No rebound/guarding. No obvious abdominal masses. Msk:  Strength and tone normal for age. Extremities/Skin: Can see light pink macule on right abdomen that is 0.5 cm diameter --pt says that is site where tic was embedded. No additional rash.  Neuro: Alert and oriented X 3. Moves all extremities spontaneously. Gait is normal. CNII-XII grossly in tact. Psych:  Responds to questions appropriately with a normal affect.     ASSESSMENT AND PLAN:  45 y.o. year old male with  1. Tick bite - CBC with Differential/Platelet - COMPLETE METABOLIC PANEL WITH GFR - B. burgdorfi antibodies by WB -  Rocky mtn spotted fvr abs pnl(IgG+IgM) - doxycycline (VIBRA-TABS) 100 MG tablet; Take 1 tablet (100 mg total) by mouth 2 (two) times daily.  Dispense: 28 tablet; Refill: 0  2. Myalgia and myositis - CBC with Differential/Platelet - COMPLETE METABOLIC PANEL WITH GFR - B. burgdorfi antibodies by WB - Rocky mtn spotted fvr abs pnl(IgG+IgM)  3. Diarrhea, unspecified type - CBC with Differential/Platelet - COMPLETE METABOLIC PANEL WITH GFR - B. burgdorfi antibodies by WB - Rocky mtn  spotted fvr abs pnl(IgG+IgM)  Discussed with patient that it is possible that his diarrhea, malaise, myalgias are secondary to a virus. Discussed that it is possible that it was just a couple incidents that he found a tick 2 days prior. However, discussed that it is possible that the above symptoms are related to the tic. We'll check above labs and follow-up with patient when we get results. We'll have him take Imodium to control the diarrhea. He is to force fluids to prevent dehydration. He is to stay with clear liquid diet and advance to bland diet to give his GI tract time to heal. Once the diarrhea has decreased, then is to start doxycycline -- then he will take this as directed and complete all of it. Follow-up if symptoms worsen or are not controlled within 48 hours and do not resolve upon completion of doxycycline.   [address] [place], [place], [facility] 04/26/2015 10:27 AM

## 2015-04-27 LAB — LYME ABY, WSTRN BLT IGG & IGM W/BANDS
B burgdorferi IgG Abs (IB): NEGATIVE
B burgdorferi IgM Abs (IB): NEGATIVE
LYME DISEASE 23 KD IGG: NONREACTIVE
LYME DISEASE 39 KD IGM: NONREACTIVE
LYME DISEASE 41 KD IGG: NONREACTIVE
LYME DISEASE 41 KD IGM: NONREACTIVE
LYME DISEASE 66 KD IGG: NONREACTIVE
Lyme Disease 18 kD IgG: NONREACTIVE
Lyme Disease 23 kD IgM: REACTIVE — AB
Lyme Disease 28 kD IgG: NONREACTIVE
Lyme Disease 30 kD IgG: NONREACTIVE
Lyme Disease 39 kD IgG: NONREACTIVE
Lyme Disease 45 kD IgG: NONREACTIVE
Lyme Disease 58 kD IgG: NONREACTIVE
Lyme Disease 93 kD IgG: NONREACTIVE

## 2015-04-27 LAB — ROCKY MTN SPOTTED FVR ABS PNL(IGG+IGM)
RMSF IGG: 0.15 IV
RMSF IgM: 0.45 IV

## 2015-04-29 LAB — LIPID PANEL
Cholesterol: 193 mg/dL (ref 125–200)
HDL: 33 mg/dL — ABNORMAL LOW (ref >=40)
LDL Cholesterol: 133 mg/dL — ABNORMAL HIGH (ref <130)
TRIGLYCERIDES: 136 mg/dL (ref <150)
Total CHOL/HDL Ratio: 5.8 Ratio — ABNORMAL HIGH (ref <=5.0)
VLDL: 27 mg/dL (ref <30)

## 2015-05-01 ENCOUNTER — Encounter: Payer: Self-pay | Admitting: Family Medicine

## 2015-05-25 ENCOUNTER — Emergency Department (HOSPITAL_COMMUNITY)
Admission: EM | Admit: 2015-05-25 | Discharge: 2015-05-25 | Disposition: A | Discharge status: Home / Self Care | Payer: Medicare HMO | Attending: Emergency Medicine | Admitting: Emergency Medicine

## 2015-05-25 DIAGNOSIS — K029 Dental caries, unspecified: Secondary | ICD-10-CM | POA: Insufficient documentation

## 2015-05-25 DIAGNOSIS — Z87891 Personal history of nicotine dependence: Secondary | ICD-10-CM | POA: Diagnosis not present

## 2015-05-25 DIAGNOSIS — R05 Cough: Secondary | ICD-10-CM | POA: Insufficient documentation

## 2015-05-25 DIAGNOSIS — Z8614 Personal history of Methicillin resistant Staphylococcus aureus infection: Secondary | ICD-10-CM | POA: Insufficient documentation

## 2015-05-25 DIAGNOSIS — R6889 Other general symptoms and signs: Secondary | ICD-10-CM

## 2015-05-25 DIAGNOSIS — Z7982 Long term (current) use of aspirin: Secondary | ICD-10-CM | POA: Insufficient documentation

## 2015-05-25 DIAGNOSIS — J111 Influenza due to unidentified influenza virus with other respiratory manifestations: Secondary | ICD-10-CM | POA: Diagnosis not present

## 2015-05-25 MED ORDER — IBUPROFEN 800 MG PO TABS: 800.0000 mg | ORAL_TABLET | Freq: Three times a day (TID) | ORAL | Status: DC | PRN

## 2015-05-25 MED ORDER — ONDANSETRON 4 MG PO TBDP
4.0000 mg | ORAL_TABLET | Freq: Once | ORAL | Status: AC
  Administered 2015-05-25: 4 mg via ORAL
  Filled 2015-05-25: qty 1

## 2015-05-25 MED ORDER — KETOROLAC TROMETHAMINE 30 MG/ML IJ SOLN
60.0000 mg | Freq: Once | INTRAMUSCULAR | Status: AC
Start: 2015-05-25 — End: 2015-05-25
  Administered 2015-05-25: 60 mg via INTRAMUSCULAR
  Filled 2015-05-25: qty 2

## 2015-05-25 MED ORDER — CLINDAMYCIN HCL 300 MG PO CAPS: 300.0000 mg | ORAL_CAPSULE | Freq: Three times a day (TID) | ORAL | Status: DC

## 2015-05-25 MED ORDER — CLINDAMYCIN HCL 150 MG PO CAPS
300.0000 mg | ORAL_CAPSULE | Freq: Once | ORAL | Status: AC
  Administered 2015-05-25: 300 mg via ORAL
  Filled 2015-05-25: qty 2

## 2015-05-25 NOTE — Discharge Instructions (Signed)
You may alternate between Tylenol 1000 mg every 6 hours as needed for fever and pain and ibuprofen 800 mg every 8 hours as needed for fever and pain.  I recommend you rest and drink plenty of fluids. Please wash your hands regularly, every mouth when you cough and do not drink after anyone as you are contagious until you have gone 24 hours without symptoms.    Dental Caries Dental caries (also called tooth decay) is the most common oral disease. It can occur at any age but is more common in children and young adults.  HOW DENTAL CARIES DEVELOPS  The process of decay begins when bacteria and foods (particularly sugars and starches) combine in your mouth to produce plaque. Plaque is a substance that sticks to the hard, outer surface of a tooth (enamel). The bacteria in plaque produce acids that attack enamel. These acids may also attack the root surface of a tooth (cementum) if it is exposed. Repeated attacks dissolve these surfaces and create holes in the tooth (cavities). If left untreated, the acids destroy the other layers of the tooth.  RISK FACTORS  Frequent sipping of sugary beverages.   Frequent snacking on sugary and starchy foods, especially those that easily get stuck in the teeth.   Poor oral hygiene.   Dry mouth.   Substance abuse such as methamphetamine abuse.   Broken or poor-fitting dental restorations.   Eating disorders.   Gastroesophageal reflux disease (GERD).   Certain radiation treatments to the head and neck. SYMPTOMS In the early stages of dental caries, symptoms are seldom present. Sometimes white, chalky areas may be seen on the enamel or other tooth layers. In later stages, symptoms may include:  Pits and holes on the enamel.  Toothache after sweet, hot, or cold foods or drinks are consumed.  Pain around the tooth.  Swelling around the tooth. DIAGNOSIS  Most of the time, dental caries is detected during a regular dental checkup. A diagnosis is  made after a thorough medical and dental history is taken and the surfaces of your teeth are checked for signs of dental caries. Sometimes special instruments, such as lasers, are used to check for dental caries. Dental X-ray exams may be taken so that areas not visible to the eye (such as between the contact areas of the teeth) can be checked for cavities.  TREATMENT  If dental caries is in its early stages, it may be reversed with a fluoride treatment or an application of a remineralizing agent at the dental office. Thorough brushing and flossing at home is needed to aid these treatments. If it is in its later stages, treatment depends on the location and extent of tooth destruction:   If a small area of the tooth has been destroyed, the destroyed area will be removed and cavities will be filled with a material such as gold, silver amalgam, or composite resin.   If a large area of the tooth has been destroyed, the destroyed area will be removed and a cap (crown) will be fitted over the remaining tooth structure.   If the center part of the tooth (pulp) is affected, a procedure called a root canal will be needed before a filling or crown can be placed.   If most of the tooth has been destroyed, the tooth may need to be pulled (extracted). HOME CARE INSTRUCTIONS You can prevent, stop, or reverse dental caries at home by practicing good oral hygiene. Good oral hygiene includes:  Thoroughly cleaning your  teeth at least twice a day with a toothbrush and dental floss.   Using a fluoride toothpaste. A fluoride mouth rinse may also be used if recommended by your dentist or health care provider.   Restricting the amount of sugary and starchy foods and sugary liquids you consume.   Avoiding frequent snacking on these foods and sipping of these liquids.   Keeping regular visits with a dentist for checkups and cleanings. PREVENTION   Practice good oral hygiene.  Consider a dental sealant. A  dental sealant is a coating material that is applied by your dentist to the pits and grooves of teeth. The sealant prevents food from being trapped in them. It may protect the teeth for several years.  Ask about fluoride supplements if you live in a community without fluorinated water or with water that has a low fluoride content. Use fluoride supplements as directed by your dentist or health care provider.  Allow fluoride varnish applications to teeth if directed by your dentist or health care provider.   This information is not intended to replace advice given to you by your health care provider. Make sure you discuss any questions you have with your health care provider.   Document Released: 12/08/2001 Document Revised: 04/08/2014 Document Reviewed: 03/20/2012 Elsevier Interactive Patient Education 2016 Elsevier Inc.  Dental Care and Dentist Visits Dental care supports good overall health. Regular dental visits can also help you avoid dental pain, bleeding, infection, and other more serious health problems in the future. It is important to keep the mouth healthy because diseases in the teeth, gums, and other oral tissues can spread to other areas of the body. Some problems, such as diabetes, heart disease, and pre-term labor have been associated with poor oral health.  See your dentist every 6 months. If you experience emergency problems such as a toothache or broken tooth, go to the dentist right away. If you see your dentist regularly, you may catch problems early. It is easier to be treated for problems in the early stages.  WHAT TO EXPECT AT A DENTIST VISIT  Your dentist will look for many common oral health problems and recommend proper treatment. At your regular dental visit, you can expect:  Gentle cleaning of the teeth and gums. This includes scraping and polishing. This helps to remove the sticky substance around the teeth and gums (plaque). Plaque forms in the mouth shortly after  eating. Over time, plaque hardens on the teeth as tartar. If tartar is not removed regularly, it can cause problems. Cleaning also helps remove stains.  Periodic X-rays. These pictures of the teeth and supporting bone will help your dentist assess the health of your teeth.  Periodic fluoride treatments. Fluoride is a natural mineral shown to help strengthen teeth. Fluoride treatmentinvolves applying a fluoride gel or varnish to the teeth. It is most commonly done in children.  Examination of the mouth, tongue, jaws, teeth, and gums to look for any oral health problems, such as:  Cavities (dental caries). This is decay on the tooth caused by plaque, sugar, and acid in the mouth. It is best to catch a cavity when it is small.  Inflammation of the gums caused by plaque buildup (gingivitis).  Problems with the mouth or malformed or misaligned teeth.  Oral cancer or other diseases of the soft tissues or jaws. KEEP YOUR TEETH AND GUMS HEALTHY For healthy teeth and gums, follow these general guidelines as well as your dentist's specific advice:  Have your teeth professionally  cleaned at the dentist every 6 months.  Brush twice daily with a fluoride toothpaste.  Floss your teeth daily.  Ask your dentist if you need fluoride supplements, treatments, or fluoride toothpaste.  Eat a healthy diet. Reduce foods and drinks with added sugar.  Avoid smoking. TREATMENT FOR ORAL HEALTH PROBLEMS If you have oral health problems, treatment varies depending on the conditions present in your teeth and gums.  Your caregiver will most likely recommend good oral hygiene at each visit.  For cavities, gingivitis, or other oral health disease, your caregiver will perform a procedure to treat the problem. This is typically done at a separate appointment. Sometimes your caregiver will refer you to another dental specialist for specific tooth problems or for surgery. SEEK IMMEDIATE DENTAL CARE IF:  You have  pain, bleeding, or soreness in the gum, tooth, jaw, or mouth area.  A permanent tooth becomes loose or separated from the gum socket.  You experience a blow or injury to the mouth or jaw area.   This information is not intended to replace advice given to you by your health care provider. Make sure you discuss any questions you have with your health care provider.   Document Released: 11/28/2010 Document Revised: 06/10/2011 Document Reviewed: 11/28/2010 Elsevier Interactive Patient Education 2016 Elsevier Inc.    Possible Influenza, Adult Influenza ("the flu") is a viral infection of the respiratory tract. It occurs more often in winter months because people spend more time in close contact with one another. Influenza can make you feel very sick. Influenza easily spreads from person to person (contagious). CAUSES  Influenza is caused by a virus that infects the respiratory tract. You can catch the virus by breathing in droplets from an infected person's cough or sneeze. You can also catch the virus by touching something that was recently contaminated with the virus and then touching your mouth, nose, or eyes. RISKS AND COMPLICATIONS You may be at risk for a more severe case of influenza if you smoke cigarettes, have diabetes, have chronic heart disease (such as heart failure) or lung disease (such as asthma), or if you have a weakened immune system. Elderly people and pregnant women are also at risk for more serious infections. The most common problem of influenza is a lung infection (pneumonia). Sometimes, this problem can require emergency medical care and may be life threatening. SIGNS AND SYMPTOMS  Symptoms typically last 4 to 10 days and may include:  Fever.  Chills.  Headache, body aches, and muscle aches.  Sore throat.  Chest discomfort and cough.  Poor appetite.  Weakness or feeling tired.  Dizziness.  Nausea or vomiting. DIAGNOSIS  Diagnosis of influenza is often made  based on your history and a physical exam. A nose or throat swab test can be done to confirm the diagnosis. TREATMENT  In mild cases, influenza goes away on its own. Treatment is directed at relieving symptoms. For more severe cases, your health care provider may prescribe antiviral medicines to shorten the sickness. Antibiotic medicines are not effective because the infection is caused by a virus, not by bacteria. HOME CARE INSTRUCTIONS  Take medicines only as directed by your health care provider.  Use a cool mist humidifier to make breathing easier.  Get plenty of rest until your temperature returns to normal. This usually takes 3 to 4 days.  Drink enough fluid to keep your urine clear or pale yellow.  Cover yourmouth and nosewhen coughing or sneezing,and wash your handswellto prevent thevirusfrom spreading.  Stay homefromwork orschool untilthe fever is gonefor at least 38full day. PREVENTION  An annual influenza vaccination (flu shot) is the best way to avoid getting influenza. An annual flu shot is now routinely recommended for all adults in the [place] SEEK MEDICAL CARE IF:  You experiencechest pain, yourcough worsens,or you producemore mucus.  Youhave nausea,vomiting, ordiarrhea.  Your fever returns or gets worse. SEEK IMMEDIATE MEDICAL CARE IF:  You havetrouble breathing, you become short of breath,or your skin ornails becomebluish.  You have severe painor stiffnessin the neck.  You develop a sudden headache, or pain in the face or ear.  You have nausea or vomiting that you cannot control. MAKE SURE YOU:   Understand these instructions.  Will watch your condition.  Will get help right away if you are not doing well or get worse.   This information is not intended to replace advice given to you by your health care provider. Make sure you discuss any questions you have with your health care provider.   Document Released: 03/15/2000 Document  Revised: 04/08/2014 Document Reviewed: 06/17/2011 Elsevier Interactive Patient Education 2016 [employer].     Emergency Department Resource Guide 1) Find a Doctor and Pay Out of Pocket Although you won't have to find out who is covered by your insurance plan, it is a good idea to ask around and get recommendations. You will then need to call the office and see if the doctor you have chosen will accept you as a new patient and what types of options they offer for patients who are self-pay. Some doctors offer discounts or will set up payment plans for their patients who do not have insurance, but you will need to ask so you aren't surprised when you get to your appointment.  2) Contact Your Local Health Department Not all health departments have doctors that can see patients for sick visits, but many do, so it is worth a call to see if yours does. If you don't know where your local health department is, you can check in your phone book. The CDC also has a tool to help you locate your state's health department, and many state websites also have listings of all of their local health departments.  3) Find a Walk-in Clinic If your illness is not likely to be very severe or complicated, you may want to try a walk in clinic. These are popping up all over the country in pharmacies, drugstores, and shopping centers. They're usually staffed by nurse practitioners or physician assistants that have been trained to treat common illnesses and complaints. They're usually fairly quick and inexpensive. However, if you have serious medical issues or chronic medical problems, these are probably not your best option.  No Primary Care Doctor: - Call Health Connect at  [phone] - they can help you locate a primary care doctor that  accepts your insurance, provides certain services, etc. - Physician Referral Service- [phone]  Chronic Pain Problems: Organization         Address  Phone   Notes  [facility]  [phone] Patients need to be referred by their primary care doctor.   Medication Assistance: Organization         Address  Phone   Notes  [facility] Medication [facility] [address] [place] [place], [place] [address] [phone] --Must be a resident of [facility] -- Must have NO insurance coverage whatsoever (no Medicaid/ Medicare, etc.) -- The pt. MUST have  a primary care doctor that directs their care regularly and follows them in the community   [facility]  [phone]   [employer]  [phone]    Agencies that provide inexpensive medical care: Organization         Address  Phone   Notes  [facility]  [phone]   [facility]    [phone]   [facility] [address] [place], [place] [address] [phone]   [facility] [address], [place] [phone]   Planned Parenthood    [phone]   [facility]    [phone]   [facility] and [facility]  [address], [place] Phone:  [phone], Fax:  [phone] Hours of Operation:  9 am - 6 pm, M-F.  Also accepts Medicaid/Medicare and self-pay.  [facility]  [address], [place] Phone: [phone], Fax: [phone]. Hours of Operation:  8:30 am - 5:30 pm, M-F.  Also accepts Medicaid and self-pay.  [facility] [place] [address] [place] Phone: [phone]   [facility] [address] Natasha Bence [place], [place] [phone], Ext. [phone] Mondays & Thursdays: 7-9 AM.  First 15 patients are seen on a first come, first serve basis.    Medicaid-accepting [facility] Providers:  Organization         Address  Phone   Notes  [facility] [address], [place] [phone] Also accepts self-pay patients.  [facility] [address]  Laurell Josephs [place], [place]  [phone]   [facility] [address], [place] [phone]   [facility] [address], [place] [phone]   Renaye Rakers [address], [place]   [phone] Only accepts [place] Access [place] patients after they have their name applied to their card.   Self-Pay (no insurance) in [facility]:  Organization         Address  Phone   Notes  Sickle Cell Patients, [facility] Medicine [address] [place], [place] [phone]   [facility] [address] [place], [place] [phone]   [facility] [place]  [address], [place] [phone]   [facility]/Dr. Osei-Bonsu  [address], [place] or [address], [place] [phone] Phone number for both [place] and [place] locations is the same.  [facility] and [facility] [address] [place] [phone]   [facility] [address] [place] or [address] [phone]   [facility] [address] [place] [phone], phone; [phone], fax Sees patients 1st and 3rd Saturday of every month.  Must not qualify for public or private insurance (i.e. Medicaid, Medicare, [place] Health Choice, Veterans' Benefits)  Household income should be no more than 200% of the poverty level The clinic cannot treat you if you are pregnant or think you are pregnant  Sexually transmitted diseases are not treated at the clinic.    Dental Care: Organization         Address  Phone  Notes  [facility] [address] [place], [place] [phone])  [phone] Accepts children up to age 32 who are enrolled in Medicaid or [place] Health Choice; pregnant women with a Medicaid card; and children who have applied for Medicaid  or [place] Health Choice, but were declined, whose parents can pay a reduced fee at time of service.  [facility]  [address], [place] [phone] Accepts children up to age 5 who are enrolled in [place] or [place] Health Choice; pregnant women with a Medicaid card; and children who have applied for Medicaid or [place] Health Choice, but were declined, whose parents can pay a reduced fee at time of service.  [facility]  [address] [place], [place] [phone] Patients are seen by appointment only. Walk-ins are not accepted. [facility] will see patients 48 years of age and older. Monday - Tuesday (8am-5pm) Most Wednesdays (8:30-5pm) $30 per visit, cash only  [facility]  [address], [facility] [phone] Patients are seen by appointment only. Walk-ins are not accepted. [facility] will see patients 87 years of age and older. One Wednesday Evening (Monthly: Volunteer Based).  $30 per visit, cash only  [employer]  [phone] for adults; Children under age 62, call Graduate Pediatric Dentistry at [phone]. Children aged 29-14, please call [phone] to request a pediatric application.  Dental services are provided in all areas of dental care including fillings, crowns and bridges, complete and partial dentures, implants, gum treatment, root canals, and extractions. Preventive care is also provided. Treatment is provided to both adults and children. Patients are selected via a lottery and there is often a waiting list.   [facility] [address], [place]  [phone] [email or web address]   [facility] [address] [place], [place] [phone], Ext. [phone] Second and Fourth Thursday of each month, opens at 6:30 AM; Clinic ends at 9 AM.  Patients are seen on a first-come first-served basis, and a limited number are seen  during each clinic.   [facility]  [address] Ether Griffins [place], [place] [phone]   Eligibility Requirements You must have lived in [place], [place], or [place] counties for at least the last three months.   You cannot be eligible for state or federal sponsored [place], including [employer], [place], or [employer].   You generally cannot be eligible for healthcare insurance through your employer.    How to apply: Eligibility screenings are held every Tuesday and Wednesday afternoon from 1:00 pm until 4:00 pm. You do not need an appointment for the interview!  [facility] [address] [place], [place] [phone]   [facility] Health Department  [phone]   [facility] Health Department  [phone]   [facility] Health Department  [phone]    Behavioral Health Resources in the Community: Intensive Outpatient Programs Organization         Address  Phone  Notes  [facility] [address], [place], [place] [phone]   [facility] Outpatient [address] [place], [place] [phone]   [facility] [address] [place], [place]  [phone]   [facility] Mental Health [address]  [place], [place] [phone] or [phone]   Substance Abuse Resources Organization         Address  Phone  Notes  [facility]  [phone]  [facility]  [phone]   [facility]  [phone]   Floydene Flock  [phone]   Residential & Outpatient Substance Abuse Program  [phone]   Psychological Services Organization         Address  Phone  Notes  [facility]  [phone]   [facility] Services  [phone]   [facility] Mental Health [address], [place] [phone] or [phone]    Mobile Crisis Teams Organization         Address  Phone  Notes  [facility],  Mobile Crisis Care Unit  [phone]   [facility]  [address] [place], [place] [phone]   Doristine Locks [address] [place] [phone]    Self-Help/Support Groups Organization         Address  Phone             Notes  [facility] [place] - variety of support groups  [phone]- [phone] Call for more information  Narcotics Anonymous (NA), [facility] [address], [employer] [place]  2 meetings at this location   Statistician         Address  Phone  Notes  [facility] [address],    [place]  [phone]   [facility]  [address], [place] [address], [place], [place] [phone]   [facility] [address] [place], [place] [phone] Admissions: 8am-3pm M-F  [facility] [address]    [place], [place] [phone]   [facility] [address] [place], [place], [place] [phone]   [facility] [address],  [place], [place] [phone]   [facility] - Intensive Outpatient [address] Laurell Josephs [address], [place], [place] [phone]   [facility] ([facility] [address] [place]  [place], [place] [phone] or [phone]   [facility] ([facility]) [address] [place], [place] [phone] Accepts Medicaid  [facility] [place] [address],  [place] [phone] Substance Abuse/Addiction Treatment   [facility] Organization         Address  Phone  Notes  [facility]  [phone]   Angie Fava, PhD [address] Ervin Knack [place], [place]   [phone] or [phone]   [facility]   [address] [place], [place] [phone]   [facility] [address] [place], [place] [phone] Insurance/Medicaid/sponsorship through [facility] and Families [address]                                    [place], [place] [phone] Therapy/tele-psych/case  [facility] [address], [place] [phone]    Dr. Lolly Mustache  [phone]   [facility]  [employer] [facility] Dept. 1) [address] [place] 2) [address] [place] 3)  [address], [place] [phone]  [phone]   [facility] Child Abuse Hotline [phone] or [phone] (After Hours)
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
## 2015-05-25 NOTE — ED Notes (Signed)
Body aches bad headaches and fever with  chills

## 2015-06-07 ENCOUNTER — Ambulatory Visit: Payer: Medicare HMO | Admitting: Physician Assistant

## 2015-06-09 ENCOUNTER — Encounter: Payer: Self-pay | Admitting: Physician Assistant

## 2015-06-14 ENCOUNTER — Encounter: Payer: Self-pay | Admitting: Physician Assistant

## 2015-06-14 ENCOUNTER — Ambulatory Visit (INDEPENDENT_AMBULATORY_CARE_PROVIDER_SITE_OTHER): Payer: Medicare HMO | Admitting: Physician Assistant

## 2015-06-14 VITALS — BP 106/70 | HR 56 | Temp 97.9°F | Resp 18 | Wt 144.0 lb

## 2015-06-14 DIAGNOSIS — H66001 Acute suppurative otitis media without spontaneous rupture of ear drum, right ear: Secondary | ICD-10-CM | POA: Diagnosis not present

## 2015-06-14 DIAGNOSIS — J0110 Acute frontal sinusitis, unspecified: Secondary | ICD-10-CM

## 2015-06-14 MED ORDER — AZITHROMYCIN 250 MG PO TABS: ORAL_TABLET | ORAL | Status: DC

## 2015-06-14 MED ORDER — PREDNISONE 20 MG PO TABS
ORAL_TABLET | ORAL | Status: DC
Start: 2015-06-14 — End: 2015-07-31

## 2015-06-14 NOTE — Progress Notes (Signed)
Patient ID: Jeremy Hancock MRN: [id number], DOB: 07/28/1970, 45 y.o. Date of Encounter: 06/14/2015, 9:27 AM    Chief Complaint:  Chief Complaint  Patient presents with  . sick    had flu 3 weeks ago,  can't get rid of congestion and pressure on rt side of head     HPI: 45 y.o. year old white male presents with above.   He ended up going to the ED on 05/25/15. Says that that was on a weekend and at that point he was feeling so horrible that he had to go somewhere for some relief. Says that he was told that it was the flu. States that ever since then he is continuing to have a lot of pressure along his right forehead, down to his right temple, and right ear. Says that his right ear has felt stopped up for 3 weeks. Says he feels like he is has an echo when he talks. States that he is not blowing much mucus out of his nose. Says that he does not have chest congestion and is not coughing up phlegm. No significant sore throat. No fevers or chills now since his initial illness when he went to the ER.     Home Meds:   Outpatient Prescriptions Prior to Visit  Medication Sig Dispense Refill  . aspirin 81 MG tablet Take 81 mg by mouth daily.    Marland Kitchen. ezetimibe (ZETIA) 10 MG tablet Take 1 tablet (10 mg total) by mouth daily. 30 tablet 0  . ibuprofen (ADVIL,MOTRIN) 800 MG tablet Take 1 tablet (800 mg total) by mouth every 8 (eight) hours as needed for mild pain. 30 tablet 0  . clindamycin (CLEOCIN) 300 MG capsule Take 1 capsule (300 mg total) by mouth 3 (three) times daily. 30 capsule 0  . doxycycline (VIBRA-TABS) 100 MG tablet Take 1 tablet (100 mg total) by mouth 2 (two) times daily. 28 tablet 0   No facility-administered medications prior to visit.    Allergies:  Allergies  Allergen Reactions  . Amoxicillin Other (See Comments)    Severe abd pain  . Codeine   . Hydrocodone-Acetaminophen Hives  . Statins Other (See Comments)    Severe myalgias      Review of Systems: See HPI for  pertinent ROS. All other ROS negative.    Physical Exam: Blood pressure 106/70, pulse 56, temperature 97.9 F (36.6 C), temperature source Oral, resp. rate 18, weight 144 lb (65.318 kg)., Body mass index is 23.96 kg/(m^2). General:  WNWD WM. Appears in no acute distress. HEENT: Normocephalic, atraumatic, eyes without discharge, sclera non-icteric, nares are without discharge. Bilateral auditory canals clear, TM's are without perforation. Left TM clear and normal. Right TM with erythema in top portion and dull/golden at inferior portion.  Oral cavity moist, posterior pharynx without exudate, erythema, peritonsillar abscess. Mild tenderness with percussion to the right frontal sinus region. No other areas of sinus tenderness with percussion. Neck: Supple. No thyromegaly. No lymphadenopathy. Lungs: Clear bilaterally to auscultation without wheezes, rales, or rhonchi. Breathing is unlabored. Heart: Regular rhythm. No murmurs, rubs, or gallops. Msk:  Strength and tone normal for age. Extremities/Skin: Warm and dry. Neuro: Alert and oriented X 3. Moves all extremities spontaneously. Gait is normal. CNII-XII grossly in tact. Psych:  Responds to questions appropriately with a normal affect.     ASSESSMENT AND PLAN:  45 y.o. year old male with  1. Acute frontal sinusitis, recurrence not specified He reports that amoxicillin causes severe abdominal  pain. Reports that he has had no adverse effects with azithromycin. He is to take the prednisone taper and the azithromycin as directed. Follow-up if symptoms do not resolve upon completion of these. - predniSONE (DELTASONE) 20 MG tablet; Take 3 daily for 2 days, then 2 daily for 2 days, then 1 daily for 2 days.  Dispense: 12 tablet; Refill: 0 - azithromycin (ZITHROMAX) 250 MG tablet; Day 1: Take 2 daily.  Days 2-5: Take 1 daily.  Dispense: 6 tablet; Refill: 0  2. Acute suppurative otitis media of right ear without spontaneous rupture of tympanic membrane,  recurrence not specified - predniSONE (DELTASONE) 20 MG tablet; Take 3 daily for 2 days, then 2 daily for 2 days, then 1 daily for 2 days.  Dispense: 12 tablet; Refill: 0 - azithromycin (ZITHROMAX) 250 MG tablet; Day 1: Take 2 daily.  Days 2-5: Take 1 daily.  Dispense: 6 tablet; Refill: 0   Signed, [address] [place], [place], [facility] 06/14/2015 9:27 AM

## 2015-07-31 ENCOUNTER — Encounter: Payer: Self-pay | Admitting: Physician Assistant

## 2015-07-31 ENCOUNTER — Ambulatory Visit (INDEPENDENT_AMBULATORY_CARE_PROVIDER_SITE_OTHER): Payer: Medicare HMO | Admitting: Physician Assistant

## 2015-07-31 VITALS — BP 104/70 | HR 60 | Temp 97.9°F | Resp 18 | Ht 63.5 in | Wt 139.0 lb

## 2015-07-31 DIAGNOSIS — Z8249 Family history of ischemic heart disease and other diseases of the circulatory system: Secondary | ICD-10-CM | POA: Diagnosis not present

## 2015-07-31 DIAGNOSIS — Z23 Encounter for immunization: Secondary | ICD-10-CM

## 2015-07-31 DIAGNOSIS — H8110 Benign paroxysmal vertigo, unspecified ear: Secondary | ICD-10-CM | POA: Diagnosis not present

## 2015-07-31 DIAGNOSIS — Z0000 Encounter for general adult medical examination without abnormal findings: Secondary | ICD-10-CM

## 2015-07-31 DIAGNOSIS — E785 Hyperlipidemia, unspecified: Secondary | ICD-10-CM | POA: Diagnosis not present

## 2015-07-31 LAB — COMPLETE METABOLIC PANEL WITH GFR
ALT: 29 U/L (ref 9–46)
AST: 21 U/L (ref 10–40)
Albumin: 4.3 g/dL (ref 3.6–5.1)
Alkaline Phosphatase: 78 U/L (ref 40–115)
BUN: 11 mg/dL (ref 7–25)
CO2: 30 mmol/L (ref 20–31)
Calcium: 9.6 mg/dL (ref 8.6–10.3)
Chloride: 105 mmol/L (ref 98–110)
Creat: 0.95 mg/dL (ref 0.60–1.35)
GFR, Est African American: >89 mL/min (ref >=60)
GLUCOSE: 100 mg/dL — AB (ref 70–99)
POTASSIUM: 4.5 mmol/L (ref 3.5–5.3)
SODIUM: 142 mmol/L (ref 135–146)
Total Bilirubin: 0.4 mg/dL (ref 0.2–1.2)
Total Protein: 6.6 g/dL (ref 6.1–8.1)

## 2015-07-31 LAB — CBC WITH DIFFERENTIAL/PLATELET
BASOS ABS: 64 {cells}/uL (ref 0–200)
Basophils Relative: 1 %
EOS ABS: 320 {cells}/uL (ref 15–500)
Eosinophils Relative: 5 %
HEMATOCRIT: 49.4 % (ref 38.5–50.0)
Hemoglobin: 16.8 g/dL (ref 13.0–17.0)
LYMPHS PCT: 29 %
Lymphs Abs: 1856 cells/uL (ref 850–3900)
MCH: 31.1 pg (ref 27.0–33.0)
MCHC: 34 g/dL (ref 32.0–36.0)
MCV: 91.5 fL (ref 80.0–100.0)
MONO ABS: 512 {cells}/uL (ref 200–950)
MONOS PCT: 8 %
MPV: 9 fL (ref 7.5–12.5)
NEUTROS ABS: 3648 {cells}/uL (ref 1500–7800)
Neutrophils Relative %: 57 %
PLATELETS: 236 10*3/uL (ref 140–400)
RBC: 5.4 MIL/uL (ref 4.20–5.80)
RDW: 14.4 % (ref 11.0–15.0)
WBC: 6.4 10*3/uL (ref 3.8–10.8)

## 2015-07-31 LAB — LIPID PANEL
CHOL/HDL RATIO: 5.3 ratio — AB (ref <=5.0)
Cholesterol: 221 mg/dL — ABNORMAL HIGH (ref 125–200)
HDL: 42 mg/dL (ref >=40)
LDL CALC: 157 mg/dL — AB (ref <130)
Triglycerides: 112 mg/dL (ref <150)
VLDL: 22 mg/dL (ref <30)

## 2015-07-31 LAB — TSH: TSH: 2.02 m[IU]/L (ref 0.40–4.50)
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
## 2015-07-31 NOTE — Addendum Note (Signed)
Addended by: Donne AnonPLUMMER, KIM M on: 07/31/2015 08:56 AM   Modules accepted: Orders

## 2015-08-01 LAB — VITAMIN D 25 HYDROXY (VIT D DEFICIENCY, FRACTURES): Vit D, 25-Hydroxy: 27 ng/mL — ABNORMAL LOW (ref 30–100)

## 2015-08-04 ENCOUNTER — Encounter: Payer: Self-pay | Admitting: Family Medicine

## 2015-09-06 DIAGNOSIS — Z87828 Personal history of other (healed) physical injury and trauma: Secondary | ICD-10-CM | POA: Diagnosis not present

## 2015-09-06 DIAGNOSIS — H8110 Benign paroxysmal vertigo, unspecified ear: Secondary | ICD-10-CM | POA: Diagnosis not present

## 2015-09-14 DIAGNOSIS — R42 Dizziness and giddiness: Secondary | ICD-10-CM | POA: Diagnosis not present

## 2015-09-14 DIAGNOSIS — H8112 Benign paroxysmal vertigo, left ear: Secondary | ICD-10-CM | POA: Diagnosis not present

## 2016-05-08 ENCOUNTER — Ambulatory Visit (INDEPENDENT_AMBULATORY_CARE_PROVIDER_SITE_OTHER): Payer: Medicare HMO | Admitting: Physician Assistant

## 2016-05-08 ENCOUNTER — Encounter: Payer: Self-pay | Admitting: Physician Assistant

## 2016-05-08 VITALS — BP 126/82 | HR 77 | Temp 98.4°F | Resp 16 | Wt 141.0 lb

## 2016-05-08 DIAGNOSIS — E785 Hyperlipidemia, unspecified: Secondary | ICD-10-CM | POA: Diagnosis not present

## 2016-05-08 DIAGNOSIS — Z8249 Family history of ischemic heart disease and other diseases of the circulatory system: Secondary | ICD-10-CM | POA: Diagnosis not present

## 2016-05-08 DIAGNOSIS — J301 Allergic rhinitis due to pollen: Secondary | ICD-10-CM

## 2016-05-08 MED ORDER — FLUTICASONE PROPIONATE 50 MCG/ACT NA SUSP: 2.0000 | Freq: Every day | NASAL | 6 refills | Status: DC

## 2016-05-08 MED ORDER — CETIRIZINE HCL 10 MG PO TABS: 10.0000 mg | ORAL_TABLET | Freq: Every day | ORAL | 11 refills | Status: DC
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
## 2016-05-10 ENCOUNTER — Telehealth: Payer: Self-pay | Admitting: Physician Assistant

## 2016-05-10 NOTE — Telephone Encounter (Signed)
PATIENT CALLING TO GET STATUS OF REFERRAL TO DR Allyson SabalBERRY  [phone]

## 2016-05-13 NOTE — Telephone Encounter (Signed)
LMTCB

## 2016-05-17 ENCOUNTER — Ambulatory Visit (INDEPENDENT_AMBULATORY_CARE_PROVIDER_SITE_OTHER): Payer: Medicare HMO | Admitting: Cardiovascular Disease

## 2016-05-17 ENCOUNTER — Other Ambulatory Visit: Payer: Self-pay | Admitting: Cardiovascular Disease

## 2016-05-17 ENCOUNTER — Encounter: Payer: Self-pay | Admitting: Cardiovascular Disease

## 2016-05-17 VITALS — BP 110/76 | HR 68 | Ht 65.0 in | Wt 140.0 lb

## 2016-05-17 DIAGNOSIS — R079 Chest pain, unspecified: Secondary | ICD-10-CM

## 2016-05-17 DIAGNOSIS — Z8249 Family history of ischemic heart disease and other diseases of the circulatory system: Secondary | ICD-10-CM

## 2016-05-17 DIAGNOSIS — R0602 Shortness of breath: Secondary | ICD-10-CM | POA: Diagnosis not present
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
## 2016-05-17 NOTE — Patient Instructions (Signed)
Your physician has requested that you have a cardiac catheterization on Monday, 05/20/16 with Dr. Allyson SabalBerry. Cardiac catheterization is used to diagnose and/or treat various heart conditions. Doctors may recommend this procedure for a number of different reasons. The most common reason is to evaluate chest pain. Chest pain can be a symptom of coronary artery disease (CAD), and cardiac catheterization can show whether plaque is narrowing or blocking your heart's arteries. This procedure is also used to evaluate the valves, as well as measure the blood flow and oxygen levels in different parts of your heart. For further information please visit [email or web address].   Following your catheterization, you will not be allowed to drive for 3 days.  No lifting, pushing, or pulling greater that 10 pounds is allowed for 1 week.  You will be required to have the following tests prior to the procedure:  1. Blood work-the blood work can be done at the hospital on the day of your procedure.  2. Chest Xray-[facility].      Puncture site Rt. Radial
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
## 2016-05-17 NOTE — Assessment & Plan Note (Signed)
History of hyperlipidemia currently not on a statin drug because of statin intolerance with recent lipid profile performed 07/31/15 revealed total cholesterol 221, LDL 157 and HDL of 42.

## 2016-05-20 ENCOUNTER — Ambulatory Visit (HOSPITAL_COMMUNITY): Payer: Medicare HMO

## 2016-05-20 ENCOUNTER — Ambulatory Visit (HOSPITAL_COMMUNITY)
Admission: AD | Admit: 2016-05-20 | Discharge: 2016-05-20 | Disposition: A | Discharge status: Home / Self Care | Payer: Medicare HMO | Source: Ambulatory Visit | Attending: Cardiovascular Disease | Admitting: Cardiovascular Disease

## 2016-05-20 ENCOUNTER — Encounter (HOSPITAL_COMMUNITY)
Admission: AD | Disposition: A | Discharge status: Home / Self Care | Payer: Self-pay | Source: Ambulatory Visit | Attending: Cardiovascular Disease

## 2016-05-20 DIAGNOSIS — I200 Unstable angina: Secondary | ICD-10-CM | POA: Diagnosis present

## 2016-05-20 DIAGNOSIS — Z7951 Long term (current) use of inhaled steroids: Secondary | ICD-10-CM | POA: Insufficient documentation

## 2016-05-20 DIAGNOSIS — F17210 Nicotine dependence, cigarettes, uncomplicated: Secondary | ICD-10-CM | POA: Diagnosis not present

## 2016-05-20 DIAGNOSIS — E785 Hyperlipidemia, unspecified: Secondary | ICD-10-CM | POA: Diagnosis not present

## 2016-05-20 DIAGNOSIS — Z880 Allergy status to penicillin: Secondary | ICD-10-CM | POA: Diagnosis not present

## 2016-05-20 DIAGNOSIS — Z01818 Encounter for other preprocedural examination: Secondary | ICD-10-CM | POA: Diagnosis not present

## 2016-05-20 DIAGNOSIS — I25110 Atherosclerotic heart disease of native coronary artery with unstable angina pectoris: Secondary | ICD-10-CM | POA: Insufficient documentation

## 2016-05-20 DIAGNOSIS — Z7982 Long term (current) use of aspirin: Secondary | ICD-10-CM | POA: Insufficient documentation

## 2016-05-20 DIAGNOSIS — Z8249 Family history of ischemic heart disease and other diseases of the circulatory system: Secondary | ICD-10-CM | POA: Diagnosis not present

## 2016-05-20 DIAGNOSIS — R079 Chest pain, unspecified: Secondary | ICD-10-CM

## 2016-05-20 DIAGNOSIS — R69 Illness, unspecified: Secondary | ICD-10-CM | POA: Diagnosis not present

## 2016-05-20 HISTORY — PX: LEFT HEART CATH AND CORONARY ANGIOGRAPHY: CATH118249

## 2016-05-20 LAB — CBC
HCT: 46.7 % (ref 39.0–52.0)
Hemoglobin: 15.8 g/dL (ref 13.0–17.0)
MCH: 30.3 pg (ref 26.0–34.0)
MCHC: 33.8 g/dL (ref 30.0–36.0)
MCV: 89.6 fL (ref 78.0–100.0)
Platelets: 215 10*3/uL (ref 150–400)
RBC: 5.21 MIL/uL (ref 4.22–5.81)
RDW: 13.4 % (ref 11.5–15.5)
WBC: 8.2 10*3/uL (ref 4.0–10.5)

## 2016-05-20 LAB — BASIC METABOLIC PANEL
Anion gap: 10 (ref 5–15)
BUN: 12 mg/dL (ref 6–20)
CALCIUM: 9.1 mg/dL (ref 8.9–10.3)
CHLORIDE: 105 mmol/L (ref 101–111)
CO2: 26 mmol/L (ref 22–32)
Creatinine, Ser: 0.93 mg/dL (ref 0.61–1.24)
GFR calc Af Amer: >60 mL/min (ref >60)
Glucose, Bld: 75 mg/dL (ref 65–99)
Potassium: 4.2 mmol/L (ref 3.5–5.1)
SODIUM: 141 mmol/L (ref 135–145)

## 2016-05-20 LAB — PROTIME-INR
INR: 0.9
PROTHROMBIN TIME: 12.1 s (ref 11.4–15.2)

## 2016-05-20 SURGERY — LEFT HEART CATH AND CORONARY ANGIOGRAPHY

## 2016-05-20 MED ORDER — FENTANYL CITRATE (PF) 100 MCG/2ML IJ SOLN
INTRAMUSCULAR | Status: AC
  Filled 2016-05-20: qty 2

## 2016-05-20 MED ORDER — IOPAMIDOL (ISOVUE-370) INJECTION 76%
INTRAVENOUS | Status: DC | PRN
  Administered 2016-05-20: 65 mL via INTRA_ARTERIAL

## 2016-05-20 MED ORDER — VERAPAMIL HCL 2.5 MG/ML IV SOLN
INTRAVENOUS | Status: DC | PRN
  Administered 2016-05-20: 7 mL via INTRA_ARTERIAL

## 2016-05-20 MED ORDER — VERAPAMIL HCL 2.5 MG/ML IV SOLN
INTRAVENOUS | Status: AC
  Filled 2016-05-20: qty 2

## 2016-05-20 MED ORDER — HEPARIN (PORCINE) IN NACL 2-0.9 UNIT/ML-% IJ SOLN
INTRAMUSCULAR | Status: AC
  Filled 2016-05-20: qty 500

## 2016-05-20 MED ORDER — SODIUM CHLORIDE 0.9 % WEIGHT BASED INFUSION: 1.0000 mL/kg/h | INTRAVENOUS | Status: DC

## 2016-05-20 MED ORDER — ASPIRIN 81 MG PO CHEW
CHEWABLE_TABLET | ORAL | Status: AC
  Administered 2016-05-20: 81 mg via ORAL
  Filled 2016-05-20: qty 1

## 2016-05-20 MED ORDER — IOPAMIDOL (ISOVUE-370) INJECTION 76%
INTRAVENOUS | Status: AC
  Filled 2016-05-20: qty 100

## 2016-05-20 MED ORDER — NITROGLYCERIN 1 MG/10 ML FOR IR/CATH LAB
INTRA_ARTERIAL | Status: AC
  Filled 2016-05-20: qty 10

## 2016-05-20 MED ORDER — LIDOCAINE HCL (PF) 1 % IJ SOLN
INTRAMUSCULAR | Status: AC
  Filled 2016-05-20: qty 30

## 2016-05-20 MED ORDER — HEPARIN (PORCINE) IN NACL 2-0.9 UNIT/ML-% IJ SOLN
INTRAMUSCULAR | Status: DC | PRN
  Administered 2016-05-20: 1000 mL via INTRA_ARTERIAL

## 2016-05-20 MED ORDER — MIDAZOLAM HCL 2 MG/2ML IJ SOLN
INTRAMUSCULAR | Status: AC
  Filled 2016-05-20: qty 2

## 2016-05-20 MED ORDER — SODIUM CHLORIDE 0.9 % WEIGHT BASED INFUSION
3.0000 mL/kg/h | INTRAVENOUS | Status: DC
  Administered 2016-05-20: 3 mL/kg/h via INTRAVENOUS

## 2016-05-20 MED ORDER — HEPARIN SODIUM (PORCINE) 1000 UNIT/ML IJ SOLN
INTRAMUSCULAR | Status: AC
  Filled 2016-05-20: qty 1

## 2016-05-20 MED ORDER — HEPARIN SODIUM (PORCINE) 1000 UNIT/ML IJ SOLN
INTRAMUSCULAR | Status: DC | PRN
  Administered 2016-05-20: 3000 [IU] via INTRAVENOUS

## 2016-05-20 MED ORDER — MIDAZOLAM HCL 2 MG/2ML IJ SOLN
INTRAMUSCULAR | Status: DC | PRN
  Administered 2016-05-20: 1 mg via INTRAVENOUS

## 2016-05-20 MED ORDER — FENTANYL CITRATE (PF) 100 MCG/2ML IJ SOLN
INTRAMUSCULAR | Status: DC | PRN
  Administered 2016-05-20 (×2): 25 ug via INTRAVENOUS

## 2016-05-20 MED ORDER — ASPIRIN 81 MG PO CHEW
81.0000 mg | CHEWABLE_TABLET | ORAL | Status: AC
  Administered 2016-05-20: 81 mg via ORAL

## 2016-05-20 MED ORDER — LIDOCAINE HCL (PF) 1 % IJ SOLN
INTRAMUSCULAR | Status: DC | PRN
  Administered 2016-05-20: 3 mL via INTRADERMAL

## 2016-05-20 MED ORDER — SODIUM CHLORIDE 0.9% FLUSH: 3.0000 mL | INTRAVENOUS | Status: DC | PRN

## 2016-05-20 SURGICAL SUPPLY — 12 items
CATH EXPO 5FR ANG PIGTAIL 145 (CATHETERS) ×1 IMPLANT
CATH OPTITORQUE TIG 4.0 5F (CATHETERS) ×1 IMPLANT
DEVICE RAD COMP TR BAND LRG (VASCULAR PRODUCTS) ×1 IMPLANT
GLIDESHEATH SLEND A-KIT 6F 22G (SHEATH) ×1 IMPLANT
GUIDEWIRE INQWIRE 1.5J.035X260 (WIRE) IMPLANT
INQWIRE 1.5J .035X260CM (WIRE) ×2
KIT HEART LEFT (KITS) ×2 IMPLANT
PACK CARDIAC CATHETERIZATION (CUSTOM PROCEDURE TRAY) ×2 IMPLANT
SYR MEDRAD MARK V 150ML (SYRINGE) ×2 IMPLANT
TRANSDUCER W/STOPCOCK (MISCELLANEOUS) ×2 IMPLANT
TUBING CIL FLEX 10 FLL-RA (TUBING) ×2 IMPLANT
WIRE HI TORQ VERSACORE-J 145CM (WIRE) ×1 IMPLANT

## 2016-05-20 NOTE — Discharge Instructions (Signed)
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
## 2016-05-20 NOTE — Progress Notes (Signed)
Jeremy PitcherJennifer ONeal,RN notified that pt ate a donut and drank tea at 0730 this am

## 2016-05-20 NOTE — Interval H&P Note (Signed)
Cath Lab Visit (complete for each Cath Lab visit)  Clinical Evaluation Leading to the Procedure:   ACS: No.  Non-ACS:    Anginal Classification: CCS II  Anti-ischemic medical therapy: No Therapy  Non-Invasive Test Results: No non-invasive testing performed  Prior CABG: No previous CABG      History and Physical Interval Note:  05/20/2016 12:22 PM  Horton FinerJames E Eckrich  has presented today for surgery, with the diagnosis of cp  The various methods of treatment have been discussed with the patient and family. After consideration of risks, benefits and other options for treatment, the patient has consented to  Procedure(s): Left Heart Cath and Coronary Angiography (N/A) as a surgical intervention .  The patient's history has been reviewed, patient examined, no change in status, stable for surgery.  I have reviewed the patient's chart and labs.  Questions were answered to the patient's satisfaction.     Nanetta BattyBerry, Jonathan

## 2016-05-21 ENCOUNTER — Encounter (HOSPITAL_COMMUNITY): Payer: Self-pay | Admitting: Cardiovascular Disease

## 2016-05-21 ENCOUNTER — Telehealth: Payer: Self-pay | Admitting: Cardiovascular Disease

## 2016-05-21 MED FILL — Nitroglycerin IV Soln 100 MCG/ML in D5W: INTRA_ARTERIAL | Qty: 10 | Status: AC

## 2016-05-21 NOTE — Telephone Encounter (Signed)
Received records from [facility] - Dr Sharyn LullHarwani- as requested by Dr Allyson SabalBerry.  Records given to Dr Allyson SabalBerry to review. lp

## 2016-05-27 ENCOUNTER — Telehealth: Payer: Self-pay | Admitting: Cardiovascular Disease

## 2016-05-27 NOTE — Telephone Encounter (Signed)
Spoke to patient  Patient states since he has been having bloody stools since cath- . ( cover toilet water) Patient is not on any antiplatelet  Other than an 81 mg aspirin.  cath site - radial.  no bruising note lower back or abdomen or abd pain.  informed patient to contact primary office. Patient verbalized understanding.

## 2016-05-27 NOTE — Telephone Encounter (Signed)
Pt had Cath last Tuesday. He said every since last Tuesday when he has a bowel movemnet he has a lot of blood in his stools.Please call asap to advise.
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
## 2016-06-04 ENCOUNTER — Encounter: Payer: Self-pay | Admitting: Cardiovascular Disease

## 2016-06-04 ENCOUNTER — Ambulatory Visit (INDEPENDENT_AMBULATORY_CARE_PROVIDER_SITE_OTHER): Payer: Medicare HMO | Admitting: Cardiovascular Disease

## 2016-06-04 DIAGNOSIS — I208 Other forms of angina pectoris: Secondary | ICD-10-CM

## 2016-06-04 DIAGNOSIS — E7800 Pure hypercholesterolemia, unspecified: Secondary | ICD-10-CM | POA: Diagnosis not present

## 2016-06-04 NOTE — Assessment & Plan Note (Signed)
History of chest pain with recent cardiac catheterization performed by myself radially 05/20/16 which was entirely normal. His chest pain is noncardiac.
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
## 2016-06-04 NOTE — Assessment & Plan Note (Signed)
History of hyperlipidemia currently not on statin therapy or Zetia with lipid profile performed 07/31/15 revealing a total cholesterol 221, LDL 157 and HDL of 42. He is statin intolerant.

## 2016-06-04 NOTE — Patient Instructions (Signed)
Medication Instructions: Your physician recommends that you continue on your current medications as directed. Please refer to the Current Medication list given to you today.   Follow-Up: Your physician recommends that you schedule a follow-up appointment as needed with Dr. Berry.    

## 2016-07-23 ENCOUNTER — Encounter: Payer: Self-pay | Admitting: Physician Assistant

## 2016-09-16 DIAGNOSIS — R69 Illness, unspecified: Secondary | ICD-10-CM | POA: Diagnosis not present

## 2016-09-16 DIAGNOSIS — Z87828 Personal history of other (healed) physical injury and trauma: Secondary | ICD-10-CM | POA: Diagnosis not present

## 2016-09-16 DIAGNOSIS — Z7289 Other problems related to lifestyle: Secondary | ICD-10-CM | POA: Diagnosis not present

## 2016-09-16 DIAGNOSIS — J302 Other seasonal allergic rhinitis: Secondary | ICD-10-CM | POA: Diagnosis not present

## 2016-09-16 DIAGNOSIS — R42 Dizziness and giddiness: Secondary | ICD-10-CM | POA: Diagnosis not present

## 2016-10-11 ENCOUNTER — Telehealth: Payer: Self-pay

## 2016-10-11 NOTE — Telephone Encounter (Signed)
Received a fax from [employer] about recall on nasal spray 50 mcg. The recall  Was issued because the product was found to contain small glass particles. Lot# [id number] exp 7/20 only. LVMTRC

## 2016-10-18 ENCOUNTER — Encounter: Payer: Self-pay | Admitting: Physician Assistant

## 2016-10-24 NOTE — Telephone Encounter (Signed)
Letter has been mailed to patient regarding the recall on the fluticasone propionate nasal spray 50 mcg NDC#[address]-01 manufactured by [employer] [id number] exp 7/20 only

## 2016-11-11 ENCOUNTER — Encounter: Payer: Self-pay | Admitting: Physician Assistant

## 2016-11-11 ENCOUNTER — Ambulatory Visit (INDEPENDENT_AMBULATORY_CARE_PROVIDER_SITE_OTHER): Payer: Medicare HMO | Admitting: Physician Assistant

## 2016-11-11 VITALS — BP 110/82 | HR 97 | Temp 98.2°F | Resp 16 | Wt 144.4 lb

## 2016-11-11 DIAGNOSIS — J0100 Acute maxillary sinusitis, unspecified: Secondary | ICD-10-CM

## 2016-11-11 MED ORDER — METHYLPREDNISOLONE ACETATE 40 MG/ML IJ SUSP
60.0000 mg | Freq: Once | INTRAMUSCULAR | Status: AC
  Administered 2016-11-11: 60 mg via INTRAMUSCULAR

## 2016-11-11 MED ORDER — AZITHROMYCIN 250 MG PO TABS: ORAL_TABLET | ORAL | 0 refills | Status: DC

## 2016-11-11 NOTE — Addendum Note (Signed)
Addended by: Phineas SemenJOHNSON, TIFFANY A on: 11/11/2016 03:18 PM   Modules accepted: Orders

## 2016-11-11 NOTE — Progress Notes (Signed)
    Patient ID: Jeremy Hancock MRN: [id number], DOB: 04/09/1970, 46 y.o. Date of Encounter: 11/11/2016, 2:24 PM    Chief Complaint:  Chief Complaint  Patient presents with  . sneezing    x3days  . Fatigue  . Headache     HPI: 46 y.o. year old male presents with above.   Reports that symptoms got a lot worse on Saturday. Started with some sore throat and also a lot of congestion in his head and nose. Has felt completely stopped up through his nose and sinuses and has been unable to sleep. Now feels like the congestions moving down into his chest as well. No known fevers or chills. No other symptoms. No further concerns to address today.     Home Meds:   Outpatient Medications Prior to Visit  Medication Sig Dispense Refill  . aspirin 81 MG tablet Take 81 mg by mouth daily.    Marland Kitchen. ibuprofen (ADVIL,MOTRIN) 800 MG tablet Take 1 tablet (800 mg total) by mouth every 8 (eight) hours as needed for mild pain. 30 tablet 0  . Multiple Vitamins-Minerals (MULTIVITAMIN WITH MINERALS) tablet Take 1 tablet by mouth daily.     No facility-administered medications prior to visit.     Allergies:  Allergies  Allergen Reactions  . Amoxicillin Other (See Comments)    Severe abd pain  . Codeine   . Hydrocodone-Acetaminophen Hives  . Statins Other (See Comments)    Severe myalgias  . Zyrtec [Cetirizine] Other (See Comments)      Review of Systems: See HPI for pertinent ROS. All other ROS negative.    Physical Exam: Blood pressure 110/82, pulse 97, temperature 98.2 F (36.8 C), temperature source Oral, resp. rate 16, weight 144 lb 6.4 oz (65.5 kg), SpO2 97 %., Body mass index is 24.03 kg/m. General:  WNWD WM Sounds very congested when speaks. Appears in no acute distress. HEENT: Normocephalic, atraumatic, eyes without discharge, sclera non-icteric, nares are without discharge. Bilateral auditory canals clear, TM's are without perforation, pearly grey and translucent with reflective cone of  light bilaterally. Oral cavity moist, posterior pharynx with moderate erythema. No exudate, no peritonsillar abscess. Positive tenderness with percussion to maxillary sinuses bilaterally. Nares appear congested and swollen.  Neck: Supple. No thyromegaly. No lymphadenopathy. Lungs: Clear bilaterally to auscultation without wheezes, rales, or rhonchi. Breathing is unlabored. Heart: Regular rhythm. No murmurs, rubs, or gallops. Msk:  Strength and tone normal for age. Extremities/Skin: Warm and dry.  Neuro: Alert and oriented X 3. Moves all extremities spontaneously. Gait is normal. CNII-XII grossly in tact. Psych:  Responds to questions appropriately with a normal affect.     ASSESSMENT AND PLAN:  46 y.o. year old male with  1. Acute maxillary sinusitis, recurrence not specified Discussed use of prednisone and patient prefers injection that will have faster onset of symptom relief.  Give Depo Medrol 60mg  IM now.  Also he is to take the antibiotic as directed. F/U if symptoms do not resolve in one week. Note-- allergy to amoxicillin. He needs no work note. Is on disability. - azithromycin (ZITHROMAX) 250 MG tablet; Day 1: Take 2. Days 2 -5: Take 1 daily.  Dispense: 6 tablet; Refill: 0   Signed, [address]Mary Beth PancoastburgDixon, [place], [facility] 11/11/2016 2:24 PM

## 2016-11-18 ENCOUNTER — Telehealth: Payer: Self-pay

## 2016-11-18 NOTE — Telephone Encounter (Signed)
lvmtrc  

## 2016-11-18 NOTE — Telephone Encounter (Signed)
Patient was seen in office on 8/13 for headcold is still sick, pt states his ears are clogged up and is req another round of antibiotic/ Pls advise

## 2016-11-18 NOTE — Telephone Encounter (Signed)
Reviewed chart. At office visit we gave Depo-Medrol 60 mg and Z-Pak antibiotic. If symptoms not resolved recommend he come in for further evaluation. Schedule OV.

## 2016-11-19 NOTE — Telephone Encounter (Signed)
Patient called back and spoke with front desk staff and made an appointment for 8/22 @415 

## 2016-11-20 ENCOUNTER — Ambulatory Visit: Payer: Medicare HMO | Admitting: Physician Assistant

## 2016-12-03 ENCOUNTER — Encounter: Payer: Self-pay | Admitting: Physician Assistant

## 2016-12-31 ENCOUNTER — Encounter: Payer: Self-pay | Admitting: Physician Assistant

## 2016-12-31 ENCOUNTER — Encounter: Payer: Self-pay | Admitting: Family Medicine

## 2016-12-31 ENCOUNTER — Ambulatory Visit (INDEPENDENT_AMBULATORY_CARE_PROVIDER_SITE_OTHER): Payer: Medicare HMO | Admitting: Family Medicine

## 2016-12-31 VITALS — BP 100/74 | HR 80 | Temp 97.7°F | Resp 16 | Wt 141.0 lb

## 2016-12-31 DIAGNOSIS — J320 Chronic maxillary sinusitis: Secondary | ICD-10-CM | POA: Diagnosis not present

## 2016-12-31 MED ORDER — CEFDINIR 300 MG PO CAPS: 300.0000 mg | ORAL_CAPSULE | Freq: Two times a day (BID) | ORAL | 0 refills | Status: DC

## 2016-12-31 MED ORDER — PREDNISONE 20 MG PO TABS: ORAL_TABLET | ORAL | 0 refills | Status: DC
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
## 2017-01-14 ENCOUNTER — Telehealth: Payer: Self-pay | Admitting: Physician Assistant

## 2017-01-14 NOTE — Telephone Encounter (Signed)
Patient is still sick, could he get another round of antibiotics called in if possible? [facility] [place]

## 2017-01-14 NOTE — Telephone Encounter (Signed)
Patient has failed Z-Pack and Omnicef.    Patient will need to be seen in OV.   Call placed to patient. LMTRC.

## 2017-01-16 ENCOUNTER — Telehealth: Payer: Self-pay | Admitting: Physician Assistant

## 2017-01-16 DIAGNOSIS — J320 Chronic maxillary sinusitis: Secondary | ICD-10-CM

## 2017-01-16 NOTE — Telephone Encounter (Signed)
Patient is calling back regarding getting another antibiotic, says that dr pickard said that he would write him another antibiotic and not have to be seen  Please advise, I let him know what the previous ov note said about needing to reassess in 2 weeks, said he did not have the money to come in or the time    [phone]

## 2017-01-16 NOTE — Telephone Encounter (Signed)
See note for 01/16/17

## 2017-01-17 MED ORDER — LEVOFLOXACIN 500 MG PO TABS: 500.0000 mg | ORAL_TABLET | Freq: Every day | ORAL | 0 refills | Status: DC

## 2017-01-17 NOTE — Telephone Encounter (Signed)
If no better, need to get ct of the sinuses having failed two abx.  Meanwhile could get levaquin 500 mg poqday for 14 days.

## 2017-01-17 NOTE — Telephone Encounter (Signed)
Patient aware of providers recommendations via vm, med sent to pharm and CT ordered.

## 2017-01-17 NOTE — Telephone Encounter (Signed)
LMTRC

## 2017-01-28 ENCOUNTER — Ambulatory Visit (HOSPITAL_COMMUNITY): Admission: RE | Admit: 2017-01-28 | Payer: Medicare HMO | Source: Ambulatory Visit

## 2017-02-04 ENCOUNTER — Encounter: Payer: Self-pay | Admitting: Physician Assistant

## 2017-04-07 ENCOUNTER — Emergency Department (HOSPITAL_COMMUNITY): Payer: Medicare HMO

## 2017-04-07 ENCOUNTER — Encounter (HOSPITAL_COMMUNITY): Payer: Self-pay

## 2017-04-07 ENCOUNTER — Emergency Department (HOSPITAL_COMMUNITY)
Admission: EM | Admit: 2017-04-07 | Discharge: 2017-04-07 | Disposition: A | Discharge status: Home / Self Care | Payer: Medicare HMO | Attending: Emergency Medicine | Admitting: Emergency Medicine

## 2017-04-07 DIAGNOSIS — M5136 Other intervertebral disc degeneration, lumbar region: Secondary | ICD-10-CM

## 2017-04-07 DIAGNOSIS — Z7982 Long term (current) use of aspirin: Secondary | ICD-10-CM | POA: Diagnosis not present

## 2017-04-07 DIAGNOSIS — M5442 Lumbago with sciatica, left side: Secondary | ICD-10-CM | POA: Diagnosis not present

## 2017-04-07 DIAGNOSIS — Z79899 Other long term (current) drug therapy: Secondary | ICD-10-CM | POA: Diagnosis not present

## 2017-04-07 DIAGNOSIS — M545 Low back pain: Secondary | ICD-10-CM | POA: Diagnosis present

## 2017-04-07 DIAGNOSIS — R69 Illness, unspecified: Secondary | ICD-10-CM | POA: Diagnosis not present

## 2017-04-07 DIAGNOSIS — M5440 Lumbago with sciatica, unspecified side: Secondary | ICD-10-CM | POA: Diagnosis not present

## 2017-04-07 DIAGNOSIS — M25552 Pain in left hip: Secondary | ICD-10-CM | POA: Diagnosis not present

## 2017-04-07 DIAGNOSIS — F17210 Nicotine dependence, cigarettes, uncomplicated: Secondary | ICD-10-CM | POA: Insufficient documentation

## 2017-04-07 DIAGNOSIS — M549 Dorsalgia, unspecified: Secondary | ICD-10-CM | POA: Diagnosis not present

## 2017-04-07 MED ORDER — TRAMADOL HCL 50 MG PO TABS: 50.0000 mg | ORAL_TABLET | Freq: Four times a day (QID) | ORAL | 0 refills | Status: DC | PRN

## 2017-04-07 MED ORDER — DIAZEPAM 5 MG PO TABS
10.0000 mg | ORAL_TABLET | Freq: Once | ORAL | Status: AC
  Administered 2017-04-07: 10 mg via ORAL
  Filled 2017-04-07: qty 2

## 2017-04-07 MED ORDER — DEXAMETHASONE SODIUM PHOSPHATE 10 MG/ML IJ SOLN
10.0000 mg | Freq: Once | INTRAMUSCULAR | Status: AC
Start: 2017-04-07 — End: 2017-04-07
  Administered 2017-04-07: 10 mg via INTRAMUSCULAR
  Filled 2017-04-07: qty 1

## 2017-04-07 MED ORDER — IBUPROFEN 800 MG PO TABS
800.0000 mg | ORAL_TABLET | Freq: Once | ORAL | Status: AC
  Administered 2017-04-07: 800 mg via ORAL
  Filled 2017-04-07: qty 1

## 2017-04-07 MED ORDER — PROMETHAZINE HCL 12.5 MG PO TABS
12.5000 mg | ORAL_TABLET | Freq: Once | ORAL | Status: AC
  Administered 2017-04-07: 12.5 mg via ORAL
  Filled 2017-04-07: qty 1

## 2017-04-07 MED ORDER — CYCLOBENZAPRINE HCL 10 MG PO TABS: 10.0000 mg | ORAL_TABLET | Freq: Three times a day (TID) | ORAL | 0 refills | Status: DC

## 2017-04-07 MED ORDER — DICLOFENAC SODIUM 75 MG PO TBEC: 75.0000 mg | DELAYED_RELEASE_TABLET | Freq: Two times a day (BID) | ORAL | 0 refills | Status: DC
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
## 2017-04-07 NOTE — Discharge Instructions (Addendum)
Your vital signs are within normal limits.  Your CT scan suggests degenerative disc disease involving your lower lumbar spine.  This is a little more advanced at the L4-L5 area.  Please see Dr. Aundria Rudogers, or the orthopedic specialist of your choice for evaluation of this and also for assistance with your pain.  Please use Flexeril 3 times daily with food.  This medication may cause drowsiness, please do not drive, drink alcohol, operate machinery, handle legal documents, or participate in activities requiring concentration when taking this medication.  Please use diclofenac 2 times daily with food.  Please use Ultram every 6 hours if needed for more severe pain.  This medicine to may cause drowsiness, please use it with caution.

## 2017-04-07 NOTE — ED Triage Notes (Signed)
Pt reports lifting a trailer last Wednesday and felt pop. Pt reports pain is in left t hip and radiating down leg. Pt reports taking motrin. Denies numbness and tingling

## 2017-04-11 DIAGNOSIS — M5126 Other intervertebral disc displacement, lumbar region: Secondary | ICD-10-CM | POA: Insufficient documentation

## 2017-04-11 DIAGNOSIS — M5416 Radiculopathy, lumbar region: Secondary | ICD-10-CM | POA: Diagnosis not present

## 2017-05-15 ENCOUNTER — Emergency Department (HOSPITAL_COMMUNITY): Payer: Medicare HMO

## 2017-05-15 ENCOUNTER — Encounter (HOSPITAL_COMMUNITY): Payer: Self-pay | Admitting: *Deleted

## 2017-05-15 ENCOUNTER — Other Ambulatory Visit: Payer: Self-pay

## 2017-05-15 ENCOUNTER — Emergency Department (HOSPITAL_COMMUNITY)
Admission: EM | Admit: 2017-05-15 | Discharge: 2017-05-15 | Disposition: A | Discharge status: Home / Self Care | Payer: Medicare HMO | Attending: Emergency Medicine | Admitting: Emergency Medicine

## 2017-05-15 DIAGNOSIS — S060X0A Concussion without loss of consciousness, initial encounter: Secondary | ICD-10-CM | POA: Diagnosis not present

## 2017-05-15 DIAGNOSIS — S0990XA Unspecified injury of head, initial encounter: Secondary | ICD-10-CM | POA: Diagnosis present

## 2017-05-15 DIAGNOSIS — M7918 Myalgia, other site: Secondary | ICD-10-CM

## 2017-05-15 DIAGNOSIS — M25511 Pain in right shoulder: Secondary | ICD-10-CM | POA: Diagnosis not present

## 2017-05-15 DIAGNOSIS — Y939 Activity, unspecified: Secondary | ICD-10-CM | POA: Diagnosis not present

## 2017-05-15 DIAGNOSIS — F17210 Nicotine dependence, cigarettes, uncomplicated: Secondary | ICD-10-CM | POA: Diagnosis not present

## 2017-05-15 DIAGNOSIS — M545 Low back pain: Secondary | ICD-10-CM | POA: Diagnosis not present

## 2017-05-15 DIAGNOSIS — Z79899 Other long term (current) drug therapy: Secondary | ICD-10-CM | POA: Insufficient documentation

## 2017-05-15 DIAGNOSIS — Z7982 Long term (current) use of aspirin: Secondary | ICD-10-CM | POA: Insufficient documentation

## 2017-05-15 DIAGNOSIS — S4991XA Unspecified injury of right shoulder and upper arm, initial encounter: Secondary | ICD-10-CM | POA: Diagnosis not present

## 2017-05-15 DIAGNOSIS — R69 Illness, unspecified: Secondary | ICD-10-CM | POA: Diagnosis not present

## 2017-05-15 DIAGNOSIS — Y999 Unspecified external cause status: Secondary | ICD-10-CM | POA: Insufficient documentation

## 2017-05-15 DIAGNOSIS — Y929 Unspecified place or not applicable: Secondary | ICD-10-CM | POA: Diagnosis not present

## 2017-05-15 DIAGNOSIS — R51 Headache: Secondary | ICD-10-CM | POA: Diagnosis not present

## 2017-05-15 MED ORDER — OXYCODONE-ACETAMINOPHEN 5-325 MG PO TABS: 1.0000 | ORAL_TABLET | Freq: Four times a day (QID) | ORAL | 0 refills | Status: DC | PRN

## 2017-05-15 MED ORDER — NAPROXEN 500 MG PO TABS: 500.0000 mg | ORAL_TABLET | Freq: Two times a day (BID) | ORAL | 0 refills | Status: DC

## 2017-05-15 MED ORDER — METHOCARBAMOL 500 MG PO TABS: 1000.0000 mg | ORAL_TABLET | Freq: Four times a day (QID) | ORAL | 0 refills | Status: DC

## 2017-05-15 MED ORDER — OXYCODONE-ACETAMINOPHEN 5-325 MG PO TABS
1.0000 | ORAL_TABLET | Freq: Once | ORAL | Status: AC
  Administered 2017-05-15: 1 via ORAL
  Filled 2017-05-15: qty 1
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
## 2017-05-15 NOTE — ED Notes (Signed)
Pt verbalized understanding of d/c instructions and has no furthe questions. VSS

## 2017-05-15 NOTE — ED Notes (Signed)
Pt to CT

## 2017-05-15 NOTE — ED Notes (Signed)
Pt returned from CT °

## 2017-05-15 NOTE — ED Notes (Signed)
Patient ambulatory to bathroom with steady gait at this time 

## 2017-05-22 ENCOUNTER — Ambulatory Visit (INDEPENDENT_AMBULATORY_CARE_PROVIDER_SITE_OTHER): Payer: Medicare HMO | Admitting: Physician Assistant

## 2017-05-22 ENCOUNTER — Other Ambulatory Visit: Payer: Self-pay

## 2017-05-22 ENCOUNTER — Encounter: Payer: Self-pay | Admitting: Physician Assistant

## 2017-05-22 VITALS — BP 110/72 | HR 91 | Temp 98.1°F | Resp 16 | Ht 65.0 in | Wt 147.4 lb

## 2017-05-22 DIAGNOSIS — Z09 Encounter for follow-up examination after completed treatment for conditions other than malignant neoplasm: Secondary | ICD-10-CM

## 2017-05-22 DIAGNOSIS — J988 Other specified respiratory disorders: Secondary | ICD-10-CM | POA: Diagnosis not present

## 2017-05-22 DIAGNOSIS — B9689 Other specified bacterial agents as the cause of diseases classified elsewhere: Principal | ICD-10-CM

## 2017-05-22 MED ORDER — AZITHROMYCIN 250 MG PO TABS: ORAL_TABLET | ORAL | 0 refills | Status: DC
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
## 2017-06-02 DIAGNOSIS — J0141 Acute recurrent pansinusitis: Secondary | ICD-10-CM | POA: Insufficient documentation

## 2017-06-02 DIAGNOSIS — J342 Deviated nasal septum: Secondary | ICD-10-CM | POA: Insufficient documentation

## 2017-06-02 DIAGNOSIS — J343 Hypertrophy of nasal turbinates: Secondary | ICD-10-CM | POA: Insufficient documentation

## 2017-06-02 DIAGNOSIS — H8110 Benign paroxysmal vertigo, unspecified ear: Secondary | ICD-10-CM | POA: Diagnosis not present

## 2017-07-08 DIAGNOSIS — J342 Deviated nasal septum: Secondary | ICD-10-CM | POA: Diagnosis not present

## 2017-07-08 DIAGNOSIS — J0141 Acute recurrent pansinusitis: Secondary | ICD-10-CM | POA: Diagnosis not present

## 2017-07-08 DIAGNOSIS — J343 Hypertrophy of nasal turbinates: Secondary | ICD-10-CM | POA: Diagnosis not present

## 2017-07-08 DIAGNOSIS — J0121 Acute recurrent ethmoidal sinusitis: Secondary | ICD-10-CM | POA: Diagnosis not present

## 2017-09-04 DIAGNOSIS — J322 Chronic ethmoidal sinusitis: Secondary | ICD-10-CM | POA: Diagnosis not present

## 2017-09-04 DIAGNOSIS — J320 Chronic maxillary sinusitis: Secondary | ICD-10-CM | POA: Diagnosis not present

## 2017-09-04 DIAGNOSIS — J329 Chronic sinusitis, unspecified: Secondary | ICD-10-CM | POA: Diagnosis not present

## 2017-09-04 DIAGNOSIS — J321 Chronic frontal sinusitis: Secondary | ICD-10-CM | POA: Diagnosis not present

## 2017-09-04 DIAGNOSIS — J342 Deviated nasal septum: Secondary | ICD-10-CM | POA: Diagnosis not present

## 2017-09-04 DIAGNOSIS — J343 Hypertrophy of nasal turbinates: Secondary | ICD-10-CM | POA: Diagnosis not present

## 2017-09-05 HISTORY — PX: FUNCTIONAL ENDOSCOPIC SINUS SURGERY: SUR616

## 2017-09-29 ENCOUNTER — Other Ambulatory Visit: Payer: Self-pay

## 2017-09-29 ENCOUNTER — Encounter: Payer: Self-pay | Admitting: Physician Assistant

## 2017-09-29 ENCOUNTER — Ambulatory Visit (INDEPENDENT_AMBULATORY_CARE_PROVIDER_SITE_OTHER): Payer: Medicare HMO | Admitting: Physician Assistant

## 2017-09-29 VITALS — BP 114/80 | HR 63 | Temp 97.7°F | Resp 14 | Ht 65.0 in | Wt 145.6 lb

## 2017-09-29 DIAGNOSIS — L239 Allergic contact dermatitis, unspecified cause: Secondary | ICD-10-CM | POA: Diagnosis not present

## 2017-09-29 MED ORDER — METHYLPREDNISOLONE ACETATE 80 MG/ML IJ SUSP
80.0000 mg | Freq: Once | INTRAMUSCULAR | Status: AC
  Administered 2017-09-29: 80 mg via INTRAMUSCULAR

## 2017-09-29 NOTE — Progress Notes (Signed)
Patient was in office and received depo medrol 80 mg injection in his right ventrogluteal. Patient tolerated well

## 2017-09-29 NOTE — Progress Notes (Signed)
    Patient ID: Jeremy Hancock MRN: [id number], DOB: 11/08/1970, 47 y.o. Date of Encounter: 09/29/2017, 9:41 AM    Chief Complaint:  Chief Complaint  Patient presents with  . poision ivy    started last thursday      HPI: 47 y.o. year old male presents with above.   Reports that he was doing some weed eating for someone last week.  Knew that one area that he "weed-eated" had poison ivy.  However in the past he used to come in contact with this and did not have problems so did not realize that this would cause such a bad reaction this time.  York SpanielSaid a couple days later he started having itchy rash on ankles and then the next day it was going on up his legs and thighs this past Saturday it was spreading all over his body and he felt absolutely miserable.  He now is feeling itchy rash everywhere including ankles legs fingers ears and face.      Home Meds:   Outpatient Medications Prior to Visit  Medication Sig Dispense Refill  . Multiple Vitamins-Minerals (MULTIVITAMIN WITH MINERALS) tablet Take 1 tablet by mouth daily.    . naproxen (NAPROSYN) 500 MG tablet Take 1 tablet (500 mg total) by mouth 2 (two) times daily. 20 tablet 0  . azithromycin (ZITHROMAX) 250 MG tablet Day 1: Take 2 daily. Days 2 -5: Take 1 daily. 6 tablet 0  . methocarbamol (ROBAXIN) 500 MG tablet Take 2 tablets (1,000 mg total) by mouth 4 (four) times daily. 20 tablet 0   No facility-administered medications prior to visit.     Allergies:  Allergies  Allergen Reactions  . Amoxicillin Other (See Comments)    Severe abd pain  . Codeine   . Hydrocodone-Acetaminophen Hives  . Statins Other (See Comments)    Severe myalgias  . Zyrtec [Cetirizine] Other (See Comments)      Review of Systems: See HPI for pertinent ROS. All other ROS negative.    Physical Exam: Blood pressure 114/80, pulse 63, temperature 97.7 F (36.5 C), temperature source Oral, resp. rate 14, height 5\' 5"  (1.651 m), weight 66 kg (145 lb 9.6  oz), SpO2 97 %., Body mass index is 24.23 kg/m. General:  WNWD WM. Appears in no acute distress.  Neck: Supple. No thyromegaly. No lymphadenopathy. Lungs: Clear bilaterally to auscultation without wheezes, rales, or rhonchi. Breathing is unlabored. Heart: Regular rhythm. No murmurs, rubs, or gallops. Msk:  Strength and tone normal for age. Skin: Papules scattered--- on ankles and legs, fingers, arms, some splotches on the top of his right ear, some splotches on his face.  There is no swelling around the eyes. Neuro: Alert and oriented X 3. Moves all extremities spontaneously. Gait is normal. CNII-XII grossly in tact. Psych:  Responds to questions appropriately with a normal affect.     ASSESSMENT AND PLAN:  47 y.o. year old male with  1. Allergic dermatitis Will give Depo-Medrol 80 mg IM here today. Is to follow-up if this does not resolve or if it starts to resolve but then reoccurs. Does have extensive areas affected but he is small--only weighs 145 pound--- so we will just use 80 mg IM Depo-Medrol-- but will follow-up if needs additional prednisone taper.   Signed, [address] D[place]enbury Beth Baxter Village[place]ixon, [place], [facility] 09/29/2017 9:41 AM

## 2017-09-29 NOTE — Addendum Note (Signed)
Addended by: Phineas SemenJOHNSON, Jonty Morrical A on: 09/29/2017 11:48 AM   Modules accepted: Orders

## 2017-10-03 ENCOUNTER — Telehealth: Payer: Self-pay | Admitting: Family Medicine

## 2017-10-03 MED ORDER — PREDNISONE 20 MG PO TABS: ORAL_TABLET | ORAL | 0 refills | Status: DC

## 2017-10-03 NOTE — Telephone Encounter (Signed)
Please call out prednisone taper pack.

## 2017-10-03 NOTE — Telephone Encounter (Signed)
Patient called in today stating that he was seen her on Monday with Shon HaleMary Beth Dixon,PA for poison ivy. He was given depo medrol 80 mg IM. Patient states that he was told if no better to call back in 3 days. He has c/o severe diffused itching. Please advise?

## 2017-10-03 NOTE — Telephone Encounter (Signed)
Med sent to pharm and pt aware via vm on cell phone

## 2017-11-18 ENCOUNTER — Ambulatory Visit (INDEPENDENT_AMBULATORY_CARE_PROVIDER_SITE_OTHER): Payer: Medicare HMO | Admitting: Family Medicine

## 2017-11-18 ENCOUNTER — Other Ambulatory Visit: Payer: Self-pay

## 2017-11-18 ENCOUNTER — Encounter: Payer: Self-pay | Admitting: Family Medicine

## 2017-11-18 VITALS — BP 126/88 | HR 69 | Temp 98.4°F | Resp 18 | Ht 65.0 in | Wt 146.6 lb

## 2017-11-18 DIAGNOSIS — B9689 Other specified bacterial agents as the cause of diseases classified elsewhere: Secondary | ICD-10-CM

## 2017-11-18 DIAGNOSIS — H7293 Unspecified perforation of tympanic membrane, bilateral: Secondary | ICD-10-CM | POA: Diagnosis not present

## 2017-11-18 DIAGNOSIS — H6693 Otitis media, unspecified, bilateral: Secondary | ICD-10-CM | POA: Diagnosis not present

## 2017-11-18 DIAGNOSIS — J329 Chronic sinusitis, unspecified: Secondary | ICD-10-CM | POA: Diagnosis not present

## 2017-11-18 MED ORDER — DOXYCYCLINE HYCLATE 100 MG PO TABS: 100.0000 mg | ORAL_TABLET | Freq: Two times a day (BID) | ORAL | 0 refills | Status: DC

## 2017-11-18 MED ORDER — CIPROFLOXACIN-DEXAMETHASONE 0.3-0.1 % OT SUSP: 4.0000 [drp] | Freq: Two times a day (BID) | OTIC | 0 refills | Status: AC

## 2017-11-18 MED ORDER — MONTELUKAST SODIUM 10 MG PO TABS: 10.0000 mg | ORAL_TABLET | Freq: Every day | ORAL | 3 refills | Status: DC

## 2017-11-18 NOTE — Patient Instructions (Signed)
Continue sinus rinses with saline nasal spray (get new clean bottles) and with sinus rinses with gentle blowing of nose prior to use of your steroid nasal sprays.  Start again a daily antihistamine (zyrtec, claritin, or allegra) and take with that montelukast.  These will help block allergies.  Take the oral antibiotics as prescribed and do drops in both ears.  I do recommend following up with ENT ASAP.

## 2017-11-18 NOTE — Progress Notes (Signed)
Patient ID: Jeremy Hancock, male    DOB: 04/06/1970, 47 y.o.   MRN: [id number]  PCP: Dorena Bodoixon, Mary B, PA-C  Chief Complaint  Patient presents with  . Nasal Congestion    had nasal surgery 6/6 has had problems since   . blood in right ear    started yesterday  . head pressure    Subjective:   Jeremy Hancock is a 47 y.o. male, presents to clinic with CC of recurrent sinus infections for the past 2 months status post nasal turbinate reduction surgery and nasal septal repair that occurred on 09/04/17.  He also presents for bilateral ear pain and pressure with decreased hearing, drainage of clear to cloudy fluid with bloody drainage from his right ear, clear drainage from his left ear that began yesterday.  States that his right ear does feel better since draining although bloody drainage did upset him and is very concerned yesterday.  Left ear continues to be more clogged with more pain and pressure has had some clear drainage no bloody drainage.  Hearing is decreased in both ears.  He has sinus pain and pressure across his forehead behind his eyes and in both of his maxillary sinuses.  He had surgery with Dr. Annalee GentaShoemaker, ENT, he has had a couple follow-up appointments with him.  He had finished a dose of Levaquin on October 17, 2017, and started clindamycin 3 times a day for 2 weeks beginning on October 17, 2017 and he states finishing last week.  He does feel better while he is on the antibiotics but as soon as he stops his symptoms worsen again.  He supposed to see his ENT tomorrow but he canceled his appointment because he does not want to go back to him and states that he did not have any problems until he had surgery.  He is doing saline nasal spray, steroid nasal spray.  No other medications for allergies or sinuses at this time.  He has had some minor productive cough intermittently, usually in the morning which he believes is secondary to postnasal drainage.  He denies any fever, sweats, chills,  nausea, vomiting, diarrhea, neck stiffness, redness or swelling anywhere his head skull face or neck.  Patient previously went to Dr. Suszanne Connerseoh for vertigo, about three years ago.   He reports penicillin allergy as blood in stool, no rash or anaphylactic reaction.  Patient Active Problem List   Diagnosis Date Noted  . Chest pain 05/17/2016  . Hyperlipidemia 07/28/2014  . Family history of premature coronary artery disease 07/28/2014  . Benign paroxysmal positional vertigo 07/28/2014  . 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 Hx MRSA infection   . Arthritis      Prior to Admission medications   Medication Sig Start Date End Date Taking? Authorizing Provider  Multiple Vitamins-Minerals (MULTIVITAMIN WITH MINERALS) tablet Take 1 tablet by mouth daily.   Yes [provider]     Allergies  Allergen Reactions  . Amoxicillin Other (See Comments)    Severe abd pain  . Codeine   . Hydrocodone-Acetaminophen Hives  . Statins Other (See Comments)    Severe myalgias  . Zyrtec [Cetirizine] Other (See Comments)     Family History  Problem Relation Age of Onset  . Cancer Mother   . Hearing loss Mother   . Diabetes Father   . Heart disease Father   . Hyperlipidemia Father      Social History   Socioeconomic History  . Marital status: Single  Spouse name: Not on file  . Number of children: 2  . Years of education: 68, GED  . Highest education level: Not on file  Occupational History  . Occupation: disabled  Social Needs  . Financial resource strain: Not on file  . Food insecurity:    Worry: Not on file    Inability: Not on file  . Transportation needs:    Medical: Not on file    Non-medical: Not on file  Tobacco Use  . Smoking status: Current Every Day Smoker    Packs/day: 1.00    Types: Cigarettes  . Smokeless tobacco: Never Used  Substance and Sexual Activity  . Alcohol use: Yes    Comment: occassional  . Drug use: No    Comment: in mid 20's  . Sexual  activity: Yes    Birth control/protection: None  Lifestyle  . Physical activity:    Days per week: Not on file    Minutes per session: Not on file  . Stress: Not on file  Relationships  . Social connections:    Talks on phone: Not on file    Gets together: Not on file    Attends religious service: Not on file    Active member of club or organization: Not on file    Attends meetings of clubs or organizations: Not on file    Relationship status: Not on file  . Intimate partner violence:    Fear of current or ex partner: Not on file    Emotionally abused: Not on file    Physically abused: Not on file    Forced sexual activity: Not on file  Other Topics Concern  . Not on file  Social History Narrative   Lives in home with father   Caffeine use - 2 L Mountain Dew daily     Review of Systems  Constitutional: Negative.   HENT: Positive for congestion, dental problem, ear discharge and ear pain. Negative for facial swelling, nosebleeds, sore throat, trouble swallowing and voice change.   Eyes: Negative.   Respiratory: Positive for cough. Negative for apnea, choking, chest tightness, shortness of breath and wheezing.   Cardiovascular: Negative.  Negative for chest pain, palpitations and leg swelling.  Gastrointestinal: Negative.  Negative for abdominal pain, diarrhea, nausea and vomiting.  Endocrine: Negative.   Genitourinary: Negative.   Musculoskeletal: Negative.  Negative for neck stiffness.  Skin: Negative.  Negative for color change, pallor and rash.  Neurological: Positive for headaches. Negative for dizziness, syncope, weakness, light-headedness and numbness.  Hematological: Negative.   Psychiatric/Behavioral: Negative.        Objective:    Vitals:   11/18/17 1141  BP: 126/88  Pulse: 69  Resp: 18  Temp: 98.4 F (36.9 C)  TempSrc: Oral  SpO2: 97%  Weight: 146 lb 9.6 oz (66.5 kg)  Height: 5\' 5"  (1.651 m)      Physical Exam  Constitutional: He appears  well-developed and well-nourished. No distress.  Very mildly ill and congested appearing male, nontoxic, alert, HOH  HENT:  Head: Normocephalic and atraumatic. Head is without raccoon's eyes, without Battle's sign, without abrasion, without contusion, without right periorbital erythema and without left periorbital erythema.  Right Ear: External ear and ear canal normal. There is drainage. No swelling or tenderness. No mastoid tenderness. Tympanic membrane is perforated. Decreased hearing is noted.  Left Ear: External ear and ear canal normal. There is drainage. No swelling or tenderness. No mastoid tenderness. Tympanic membrane is perforated. Decreased hearing is  noted.  Nose: Mucosal edema present. No septal deviation or nasal septal hematoma. No epistaxis.  Mouth/Throat: No oropharyngeal exudate.  Diffuse mucosal edema and erythema with moderate nasal discharge, right nare patent, left appears obstructed with edema and nasal turbinate enlarged more than right. No ttp over sinus No tenderness, edema or erythema over mastoid bilaterally Right TM with serosanguineous drainage from to visualize perforations, debris and fluid visualized behind remaining tympanic membrane areas, right external auditory canal normal appearing except for passage of drainage and some crusting  Left TM abnormal, no cone of light, obscured landmarks, do not definitively visualize TM perforation but visualized ample clear fluid and bubbles with significant injection and erythema to TM  Eyes: Pupils are equal, round, and reactive to light. Conjunctivae and EOM are normal. Right eye exhibits no discharge. Left eye exhibits no discharge. No scleral icterus.  Neck: Trachea normal, normal range of motion, full passive range of motion without pain and phonation normal. Neck supple. No neck rigidity. No tracheal deviation and normal range of motion present.  Cardiovascular: Normal rate and regular rhythm. PMI is not displaced. Exam  reveals no gallop and no friction rub.  No murmur heard. Pulses:      Radial pulses are 2+ on the right side, and 2+ on the left side.  Pulmonary/Chest: Effort normal. No stridor. No respiratory distress.  Musculoskeletal: Normal range of motion.  Lymphadenopathy:    He has no cervical adenopathy.  No palpated lymphadenopathy head or neck  Neurological: He is alert. He exhibits normal muscle tone. Coordination normal.  Skin: Skin is warm and dry. No rash noted. He is not diaphoretic.  Psychiatric: He has a normal mood and affect. His behavior is normal.  Nursing note and vitals reviewed.         Assessment & Plan:      ICD-10-CM   1. Otitis media of both ears with rupture of tympanic membrane H66.93 Ambulatory referral to ENT   H72.93   2. Bacterial sinusitis J32.9    B96.89     Patient with acute on chronic rhinosinusitis s/p septoplasty, turbinate reduction and b/l endoscopic sinus surgery 09/05/17 presents with recurrent worsening of sinus pain, pressure and congestion with new bilateral ear pain and pressure onset yesterday with bilateral ear drainage, right ear with bloody drainage, also occurring yesterday.  On exam he appears to have a perforated right tympanic membrane, likely also perforated left tympanic membrane, both actively draining, grossly decreased hearing, no otitis externa or concern for mastoiditis, nasal mucosa is edematous and erythematous and congested, otherwise patient has no meningismus, chest congestion, and only appears mildly ill, nontoxic-appearing.  Vital signs stable, treat bilateral otitis media with perforated TMs with Ciprodex bilaterally, patient has had full course of Levaquin and clindamycin in the past month a month and a half.  He does not have a true penicillin allergy but will try doxycycline 10 days to treat rhinosinusitis, feel this will avoid possible allergy to Augmentin and feel the coverage would be better than a cephalosporin.    Patient  has been advised multiple times to follow-up with his ENT with his recurrent postop infections and complications.  He did cancel his appointment for tomorrow.  I did encourage him to get back to see Dr. Annalee GentaShoemaker for follow-up.  Explained that ruptured TMs do need ENT follow-up in 6 weeks.  We will try to submit a referral for a second opinion, however I did explain to the patient that if there are no excepting  ENT providers willing to see him after Dr. Annalee Genta did surgery he should go to Dr. Annalee Genta to be rechecked.  Instructions were reviewed with the patient in printed form as well.  They were reviewed with his girlfriend in the room including return precautions and ER precautions which they both verbalized understanding of.  Recheck TMs in 1 week  Danelle Berry, PA-C 11/18/17 11:55 AM

## 2017-11-25 ENCOUNTER — Ambulatory Visit (INDEPENDENT_AMBULATORY_CARE_PROVIDER_SITE_OTHER): Payer: Medicare HMO | Admitting: Family Medicine

## 2017-11-25 ENCOUNTER — Other Ambulatory Visit: Payer: Self-pay

## 2017-11-25 ENCOUNTER — Encounter: Payer: Self-pay | Admitting: Family Medicine

## 2017-11-25 VITALS — BP 122/68 | HR 78 | Temp 98.2°F | Resp 14 | Ht 65.0 in | Wt 146.0 lb

## 2017-11-25 DIAGNOSIS — H6693 Otitis media, unspecified, bilateral: Secondary | ICD-10-CM

## 2017-11-25 DIAGNOSIS — H7293 Unspecified perforation of tympanic membrane, bilateral: Secondary | ICD-10-CM

## 2017-11-25 DIAGNOSIS — H66011 Acute suppurative otitis media with spontaneous rupture of ear drum, right ear: Secondary | ICD-10-CM | POA: Insufficient documentation

## 2017-11-25 DIAGNOSIS — J329 Chronic sinusitis, unspecified: Secondary | ICD-10-CM | POA: Diagnosis not present

## 2017-11-25 DIAGNOSIS — B9689 Other specified bacterial agents as the cause of diseases classified elsewhere: Secondary | ICD-10-CM

## 2017-11-25 NOTE — Progress Notes (Signed)
Patient ID: Jeremy FinerJames E Derick, male    DOB: 10/23/1970, 47 y.o.   MRN: [id number]  PCP: Dorena Bodoixon, Mary B, PA-C  Chief Complaint  Patient presents with  . Follow-up    otitis media, sinus infection, tympanic rupture    Subjective:   Jeremy Hancock is a 47 y.o. male, presents to clinic with CC of right TM rupture, b/l otitis media and recurrent complicated sinusitis following surgery 2 months ago.  He is here for recheck of infection and ear drums.  He feels improved with no more ear drainage, improved hearing to left ear and improved nasal/sinus congestion/drainage.  He does have vertigo when he gets out of bed or when he looks up.  He did cancel his ENT appointment last week but he is scheduled with Dr. Annalee GentaShoemaker later today for recheck.  He has 1 more day of antibiotics.  He feels that the swelling and pain of both ears has improved significantly.  He does continue to have decreased hearing in his right ear.  He states that he had coughed and blown his nose and he has popping in the left ear and his hearing has improved somewhat since the popping.  He denies headache, fever, chills, sweats, nausea, sore throat, neck pain or stiffness, coughing, wheeze, shortness of breath. He is doing Flonase daily and taking Singulair and an antihistamine daily as well.   Patient Active Problem List   Diagnosis Date Noted  . Chest pain 05/17/2016  . Hyperlipidemia 07/28/2014  . Family history of premature coronary artery disease 07/28/2014  . Benign paroxysmal positional vertigo 07/28/2014  . 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 Hx MRSA infection   . Arthritis      Prior to Admission medications   Medication Sig Start Date End Date Taking? Authorizing Provider  ciprofloxacin-dexamethasone (CIPRODEX) OTIC suspension Place 4 drops into both ears 2 (two) times daily for 7 days. 11/18/17 11/25/17 Yes Danelle Berryapia, Ellagrace Yoshida, PA-C  doxycycline (VIBRA-TABS) 100 MG tablet Take 1 tablet (100 mg total) by mouth 2  (two) times daily. 11/18/17  Yes Danelle Berryapia, Kellyn Mccary, PA-C  montelukast (SINGULAIR) 10 MG tablet Take 1 tablet (10 mg total) by mouth at bedtime. 11/18/17  Yes Danelle Berryapia, Nancee Brownrigg, PA-C  Multiple Vitamins-Minerals (MULTIVITAMIN WITH MINERALS) tablet Take 1 tablet by mouth daily.   Yes [provider]     Allergies  Allergen Reactions  . Amoxicillin Other (See Comments)    Severe abd pain  . Codeine   . Hydrocodone-Acetaminophen Hives  . Statins Other (See Comments)    Severe myalgias  . Zyrtec [Cetirizine] Other (See Comments)     Family History  Problem Relation Age of Onset  . Cancer Mother   . Hearing loss Mother   . Diabetes Father   . Heart disease Father   . Hyperlipidemia Father      Social History   Socioeconomic History  . Marital status: Single    Spouse name: Not on file  . Number of children: 2  . Years of education: 449, GED  . Highest education level: Not on file  Occupational History  . Occupation: disabled  Social Needs  . Financial resource strain: Not on file  . Food insecurity:    Worry: Not on file    Inability: Not on file  . Transportation needs:    Medical: Not on file    Non-medical: Not on file  Tobacco Use  . Smoking status: Current Every Day Smoker    Packs/day: 1.00  Types: Cigarettes  . Smokeless tobacco: Never Used  Substance and Sexual Activity  . Alcohol use: Yes    Comment: occassional  . Drug use: No    Comment: in mid 20's  . Sexual activity: Yes    Birth control/protection: None  Lifestyle  . Physical activity:    Days per week: Not on file    Minutes per session: Not on file  . Stress: Not on file  Relationships  . Social connections:    Talks on phone: Not on file    Gets together: Not on file    Attends religious service: Not on file    Active member of club or organization: Not on file    Attends meetings of clubs or organizations: Not on file    Relationship status: Not on file  . Intimate partner violence:     Fear of current or ex partner: Not on file    Emotionally abused: Not on file    Physically abused: Not on file    Forced sexual activity: Not on file  Other Topics Concern  . Not on file  Social History Narrative   Lives in home with father   Caffeine use - 2 L Mountain Dew daily     Review of Systems  Constitutional: Negative.  Negative for fever.  Eyes: Negative.   Respiratory: Negative.   Cardiovascular: Negative.   Gastrointestinal: Negative.   Endocrine: Negative.   Genitourinary: Negative.   Allergic/Immunologic: Negative.   Neurological: Negative.        Objective:    Vitals:   11/25/17 1032  BP: 122/68  Pulse: 78  Resp: 14  Temp: 98.2 F (36.8 C)  TempSrc: Oral  SpO2: 98%  Weight: 146 lb (66.2 kg)  Height: 5\' 5"  (1.651 m)      Physical Exam  Constitutional: He appears well-developed and well-nourished. No distress.  Well appearing  HENT:  Head: Normocephalic and atraumatic.  Right Ear: External ear and ear canal normal. No drainage, swelling or tenderness. No mastoid tenderness. Decreased hearing is noted.  Left Ear: External ear and ear canal normal. No drainage or swelling. No mastoid tenderness. Tympanic membrane is not injected, not perforated, not erythematous, not retracted and not bulging.  No middle ear effusion. No decreased hearing is noted.  Mouth/Throat: Oropharynx is clear and moist.  Right TM opaque with loss of landmarks with two areas of dried blood where last week TM was perforated Moderate nasal edema and mild erythema, no congestion, scant discharge, no sinus ttp  Eyes: Conjunctivae are normal. Right eye exhibits no discharge. Left eye exhibits no discharge.  Neck: No tracheal deviation present.  Cardiovascular: Normal rate and regular rhythm.  Pulmonary/Chest: Effort normal. No stridor. No respiratory distress.  Musculoskeletal: Normal range of motion.  Neurological: He is alert. He exhibits normal muscle tone. Coordination normal.   Skin: Skin is warm and dry. No rash noted. He is not diaphoretic.  Psychiatric: He has a normal mood and affect. His behavior is normal.  Nursing note and vitals reviewed.         Assessment & Plan:      ICD-10-CM   1. Otitis media of both ears with rupture of tympanic membrane H66.93    H72.93    improving with abx, following up with ENT today  2. Bacterial sinusitis J32.9    B96.89    Improving, finish antibiotics, continue nasal sprays and allergy medication, follow-up with ENT/surgeon     PT here for  recheck after dx of ruptured right TM last week with bloody and purulent drainage and remaining TM appearance of b/l otitis media and recurrent sinusitis following surgery with ENT 2 weeks ago.  Last week pt said he had canceled f/up appt with ENT, he was urged to return to his surgeon for complications and recurrent infections following surgery, he has made an appt to f/up with him today.   Danelle Berry, PA-C 11/25/17 10:42 AM

## 2018-01-12 ENCOUNTER — Ambulatory Visit (INDEPENDENT_AMBULATORY_CARE_PROVIDER_SITE_OTHER): Payer: Medicare HMO | Admitting: Family Medicine

## 2018-01-12 ENCOUNTER — Encounter: Payer: Self-pay | Admitting: Family Medicine

## 2018-01-12 VITALS — BP 110/74 | HR 92 | Temp 98.9°F | Resp 18 | Wt 148.0 lb

## 2018-01-12 DIAGNOSIS — R6889 Other general symptoms and signs: Secondary | ICD-10-CM

## 2018-01-12 LAB — INFLUENZA A AND B AG, IMMUNOASSAY
INFLUENZA A ANTIGEN: NOT DETECTED
INFLUENZA B ANTIGEN: NOT DETECTED

## 2018-01-12 MED ORDER — HYDROCODONE-HOMATROPINE 5-1.5 MG/5ML PO SYRP: 5.0000 mL | ORAL_SOLUTION | Freq: Three times a day (TID) | ORAL | 0 refills | Status: DC | PRN

## 2018-01-12 MED ORDER — LEVOFLOXACIN 500 MG PO TABS: 500.0000 mg | ORAL_TABLET | Freq: Every day | ORAL | 0 refills | Status: DC
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
## 2018-04-21 ENCOUNTER — Encounter: Payer: Self-pay | Admitting: Family Medicine

## 2018-04-21 ENCOUNTER — Ambulatory Visit (INDEPENDENT_AMBULATORY_CARE_PROVIDER_SITE_OTHER): Payer: Medicare HMO | Admitting: Family Medicine

## 2018-04-21 VITALS — BP 114/72 | HR 71 | Temp 98.1°F | Resp 16 | Ht 65.0 in | Wt 154.2 lb

## 2018-04-21 DIAGNOSIS — M79631 Pain in right forearm: Secondary | ICD-10-CM

## 2018-04-21 DIAGNOSIS — F17200 Nicotine dependence, unspecified, uncomplicated: Secondary | ICD-10-CM

## 2018-04-21 DIAGNOSIS — M79604 Pain in right leg: Secondary | ICD-10-CM

## 2018-04-21 DIAGNOSIS — H6521 Chronic serous otitis media, right ear: Secondary | ICD-10-CM

## 2018-04-21 DIAGNOSIS — J329 Chronic sinusitis, unspecified: Secondary | ICD-10-CM

## 2018-04-21 DIAGNOSIS — R69 Illness, unspecified: Secondary | ICD-10-CM | POA: Diagnosis not present

## 2018-04-21 DIAGNOSIS — J209 Acute bronchitis, unspecified: Secondary | ICD-10-CM

## 2018-04-21 MED ORDER — BENZONATATE 100 MG PO CAPS: 100.0000 mg | ORAL_CAPSULE | Freq: Three times a day (TID) | ORAL | 0 refills | Status: DC | PRN

## 2018-04-21 MED ORDER — GUAIFENESIN ER 600 MG PO TB12: 600.0000 mg | ORAL_TABLET | Freq: Two times a day (BID) | ORAL | 0 refills | Status: AC

## 2018-04-21 MED ORDER — ALBUTEROL SULFATE HFA 108 (90 BASE) MCG/ACT IN AERS: 2.0000 | INHALATION_SPRAY | RESPIRATORY_TRACT | 0 refills | Status: DC | PRN

## 2018-04-21 MED ORDER — PREDNISONE 20 MG PO TABS: ORAL_TABLET | ORAL | 0 refills | Status: DC

## 2018-04-21 MED ORDER — AZELASTINE HCL 0.1 % NA SOLN: 2.0000 | Freq: Two times a day (BID) | NASAL | 1 refills | Status: DC

## 2018-04-21 NOTE — Patient Instructions (Signed)
Do a daily antihistamine (zyrtec, claritin, allegra or zyxal), do steroid nasal spray, and singulair - do these daily for 2 weeks and follow up.  If no improvement we will see what is necessary next to check your sinus/head pressure and symptoms.    Treating cough like acute bronchitis, which you may be prone to get since you are a current smoker.

## 2018-04-21 NOTE — Progress Notes (Signed)
Patient ID: Jeremy Hancock, male    DOB: [birth year], 48 y.o.   MRN: [id number]  PCP: Danelle Berry, PA-C  Chief Complaint  Patient presents with  . Cough    has cough that causes pain to right leg.     Subjective:   Jeremy Hancock is a 48 y.o. male, presents to clinic with CC of multiple symptoms, he complains of cough since October, cough is severe intermittently productive, usually first in the morning will be small amounts yellow to green.  States the cough is so forceful is causing him to have pain in various areas of his body including shooting down his leg he also complains of focal pain in his right forearm.  He has felt very fatigued for 2 weeks slightly short of breath with exertion for 2 weeks but he denies any wheeze, inspiratory chest pain, fever sweats weight loss.  He continues to smoke daily.  He also complains of continued sinus pain and pressure he grabs the right upper side of his cheek forehead and right temple continues to complain of pressure in his right ear.  He is not taking any medications for sinuses or allergies.  He denies any fever chills sweats nausea vomiting.  States when he blows his nose it is clear.   Says that a few months ago he was diagnosed with the flu's brother -he did see Dr. Tanya Nones who advised him to allow his body to fight his illness and he suspected parainfluenza, he did not go get a chest x-ray did not end up taking antibiotics, Levaquin and he states that he did get better at that time.  He also states that he has been sick since that time. He continues to be upset about his sinus surgery and the ENT stating that he "messed him up."    Patient Active Problem List   Diagnosis Date Noted  . Chest pain 05/17/2016  . Hyperlipidemia 07/28/2014  . Family history of premature coronary artery disease 07/28/2014  . Benign paroxysmal positional vertigo 07/28/2014  . 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 Hx MRSA infection   . Arthritis       Prior to Admission medications   Medication Sig Start Date End Date Taking? Authorizing Provider  Multiple Vitamins-Minerals (MULTIVITAMIN WITH MINERALS) tablet Take 1 tablet by mouth daily.   Yes [provider]     Allergies  Allergen Reactions  . Amoxicillin Other (See Comments)    Severe abd pain  . Codeine   . Hydrocodone-Acetaminophen Hives  . Statins Other (See Comments)    Severe myalgias  . Zyrtec [Cetirizine] Other (See Comments)     Family History  Problem Relation Age of Onset  . Cancer Mother   . Hearing loss Mother   . Diabetes Father   . Heart disease Father   . Hyperlipidemia Father      Social History   Socioeconomic History  . Marital status: Single    Spouse name: Not on file  . Number of children: 2  . Years of education: 48, GED  . Highest education level: Not on file  Occupational History  . Occupation: disabled  Social Needs  . Financial resource strain: Not on file  . Food insecurity:    Worry: Not on file    Inability: Not on file  . Transportation needs:    Medical: Not on file    Non-medical: Not on file  Tobacco Use  . Smoking status: Current Every Day Smoker  Packs/day: 1.00    Types: Cigarettes  . Smokeless tobacco: Never Used  Substance and Sexual Activity  . Alcohol use: Yes    Comment: occassional  . Drug use: No    Comment: in mid 20's  . Sexual activity: Yes    Birth control/protection: None  Lifestyle  . Physical activity:    Days per week: Not on file    Minutes per session: Not on file  . Stress: Not on file  Relationships  . Social connections:    Talks on phone: Not on file    Gets together: Not on file    Attends religious service: Not on file    Active member of club or organization: Not on file    Attends meetings of clubs or organizations: Not on file    Relationship status: Not on file  . Intimate partner violence:    Fear of current or ex partner: Not on file    Emotionally abused:  Not on file    Physically abused: Not on file    Forced sexual activity: Not on file  Other Topics Concern  . Not on file  Social History Narrative   Lives in home with father   Caffeine use - 2 L Mountain Dew daily     Review of Systems  Constitutional: Negative.   HENT: Negative.   Eyes: Negative.   Respiratory: Negative.   Cardiovascular: Negative.   Gastrointestinal: Negative.   Endocrine: Negative.   Genitourinary: Negative.   Musculoskeletal: Negative.   Skin: Negative.   Allergic/Immunologic: Negative.   Neurological: Negative.   Hematological: Negative.   Psychiatric/Behavioral: Negative.   All other systems reviewed and are negative.      Objective:    Vitals:   04/21/18 1217  BP: 114/72  Pulse: 71  Resp: 16  Temp: 98.1 F (36.7 C)  TempSrc: Oral  SpO2: 98%  Weight: 154 lb 4 oz (70 kg)  Height: 5\' 5"  (1.651 m)      Physical Exam Vitals signs and nursing note reviewed.  Constitutional:      General: He is not in acute distress.    Appearance: Normal appearance. He is well-developed. He is not ill-appearing, toxic-appearing or diaphoretic.  HENT:     Head: Normocephalic and atraumatic.     Jaw: No trismus.     Right Ear: Ear canal and external ear normal. No drainage, swelling or tenderness. A middle ear effusion is present. There is no impacted cerumen. No mastoid tenderness. Tympanic membrane is scarred. Tympanic membrane is not injected, perforated or erythematous.     Left Ear: Tympanic membrane, ear canal and external ear normal. There is no impacted cerumen.     Nose: Congestion and rhinorrhea present.     Right Turbinates: Enlarged and swollen.     Left Turbinates: Enlarged and swollen.     Right Sinus: No maxillary sinus tenderness or frontal sinus tenderness.     Left Sinus: No maxillary sinus tenderness or frontal sinus tenderness.     Mouth/Throat:     Mouth: Mucous membranes are moist. Mucous membranes are not pale, not dry and not  cyanotic.     Pharynx: Oropharynx is clear. Uvula midline. No oropharyngeal exudate, posterior oropharyngeal erythema or uvula swelling.     Tonsils: No tonsillar exudate or tonsillar abscesses.  Eyes:     General: Lids are normal.        Right eye: No discharge.        Left eye:  No discharge.     Conjunctiva/sclera: Conjunctivae normal.     Pupils: Pupils are equal, round, and reactive to light.  Neck:     Musculoskeletal: Normal range of motion and neck supple.     Trachea: Trachea and phonation normal. No tracheal deviation.  Cardiovascular:     Rate and Rhythm: Normal rate and regular rhythm.     Pulses: Normal pulses.          Radial pulses are 2+ on the right side and 2+ on the left side.     Heart sounds: Normal heart sounds. No murmur. No friction rub. No gallop.   Pulmonary:     Effort: Pulmonary effort is normal. No tachypnea, accessory muscle usage, prolonged expiration or respiratory distress.     Breath sounds: Normal breath sounds. No stridor. No wheezing, rhonchi or rales.     Comments: Slightly decreased BS b/l at the bases L >R, no rhonchi, rales or wheeze on exam Abdominal:     General: Bowel sounds are normal. There is no distension.     Palpations: Abdomen is soft.     Tenderness: There is no abdominal tenderness.  Musculoskeletal: Normal range of motion.     Right shoulder: Normal.     Cervical back: Normal.     Thoracic back: Normal.     Lumbar back: Normal.     Right forearm: Normal.  Lymphadenopathy:     Cervical: No cervical adenopathy.  Skin:    General: Skin is warm and dry.     Capillary Refill: Capillary refill takes less than 2 seconds.     Coloration: Skin is not pale.     Findings: No rash.     Nails: There is no clubbing.   Neurological:     Mental Status: He is alert and oriented to person, place, and time.     Motor: No abnormal muscle tone.     Coordination: Coordination normal.     Gait: Gait normal.     Comments: 5/5 grip strength b/l  to UE  Psychiatric:        Speech: Speech normal.        Behavior: Behavior normal. Behavior is cooperative.           Assessment & Plan:      ICD-10-CM   1. Chronic sinusitis, unspecified location J32.9    not treating any allergic rhinitis, tx daily for 1-2 weeks, recheck - pt concerned he needs a CT scan, would wait for recheck - allergy referral?  2. Right chronic serous otitis media H65.21    clear fluid, tx nasal sx/allergies, recheck in 2 weeks  3. Acute bronchitis, unspecified organism J20.9    tx with steroids, albuterol inhaler, mucinex - CXR if not getting better, current smoker, encouraged cessation  4. Right leg pain M79.604    suspect sciatica with hx of lumbar DDD, prednisone should help  5. Right forearm pain M79.631    interesting hx - pain, sometimes weakness, no PE findings  6. Current smoker F17.200     Advised to stop smoking, if not improving can get CXR - still ordered.   Danelle BerryLeisa Meng [place], PA-C 04/21/18 12:20 PM

## 2018-05-05 ENCOUNTER — Encounter: Payer: Self-pay | Admitting: Family Medicine

## 2018-05-05 ENCOUNTER — Ambulatory Visit (INDEPENDENT_AMBULATORY_CARE_PROVIDER_SITE_OTHER): Payer: Medicare HMO | Admitting: Family Medicine

## 2018-05-05 VITALS — BP 122/74 | HR 67 | Temp 98.3°F | Resp 15 | Wt 143.1 lb

## 2018-05-05 DIAGNOSIS — R5383 Other fatigue: Secondary | ICD-10-CM | POA: Diagnosis not present

## 2018-05-05 DIAGNOSIS — R0602 Shortness of breath: Secondary | ICD-10-CM | POA: Diagnosis not present

## 2018-05-05 DIAGNOSIS — Z8249 Family history of ischemic heart disease and other diseases of the circulatory system: Secondary | ICD-10-CM | POA: Diagnosis not present

## 2018-05-05 DIAGNOSIS — R69 Illness, unspecified: Secondary | ICD-10-CM | POA: Diagnosis not present

## 2018-05-05 DIAGNOSIS — F17200 Nicotine dependence, unspecified, uncomplicated: Secondary | ICD-10-CM

## 2018-05-05 DIAGNOSIS — R002 Palpitations: Secondary | ICD-10-CM

## 2018-05-05 MED ORDER — BUDESONIDE-FORMOTEROL FUMARATE 80-4.5 MCG/ACT IN AERO: 2.0000 | INHALATION_SPRAY | Freq: Two times a day (BID) | RESPIRATORY_TRACT | 12 refills | Status: DC

## 2018-05-05 MED ORDER — PREDNISONE 20 MG PO TABS: ORAL_TABLET | ORAL | 0 refills | Status: DC

## 2018-05-05 NOTE — Patient Instructions (Signed)
Start the steroid for the next 5-10 days. Add the daily inhaler - rinse your mouth out afterwards, and you can still use your albuterol inhaler as needed.    Go get the Chest Xray done.  Return ASAP to get your labs done.  Your EKG has changes just a little bit - I would call and get just a routine check up with your cardiologist.   The rest of the plan depends on your test results, but I think getting some additional lung tests would be helpful to see if you are developing some chronic lung issues.  You can schedule that at a future appointment with Dr. Tanya Nones or I can refer you to a lung specialist.   Steps to Quit Smoking  Smoking tobacco can be bad for your health. It can also affect almost every organ in your body. Smoking puts you and people around you at risk for many serious long-lasting (chronic) diseases. Quitting smoking is hard, but it is one of the best things that you can do for your health. It is never too late to quit. What are the benefits of quitting smoking? When you quit smoking, you lower your risk for getting serious diseases and conditions. They can include:  Lung cancer or lung disease.  Heart disease.  Stroke.  Heart attack.  Not being able to have children (infertility).  Weak bones (osteoporosis) and broken bones (fractures). If you have coughing, wheezing, and shortness of breath, those symptoms may get better when you quit. You may also get sick less often. If you are pregnant, quitting smoking can help to lower your chances of having a baby of low birth weight. What can I do to help me quit smoking? Talk with your doctor about what can help you quit smoking. Some things you can do (strategies) include:  Quitting smoking totally, instead of slowly cutting back how much you smoke over a period of time.  Going to in-person counseling. You are more likely to quit if you go to many counseling sessions.  Using resources and support systems, such  as: ? Agricultural engineer with a Veterinary surgeon. ? Phone quitlines. ? Automotive engineer. ? Support groups or group counseling. ? Text messaging programs. ? Mobile phone apps or applications.  Taking medicines. Some of these medicines may have nicotine in them. If you are pregnant or breastfeeding, do not take any medicines to quit smoking unless your doctor says it is okay. Talk with your doctor about counseling or other things that can help you. Talk with your doctor about using more than one strategy at the same time, such as taking medicines while you are also going to in-person counseling. This can help make quitting easier. What things can I do to make it easier to quit? Quitting smoking might feel very hard at first, but there is a lot that you can do to make it easier. Take these steps:  Talk to your family and friends. Ask them to support and encourage you.  Call phone quitlines, reach out to support groups, or work with a Veterinary surgeon.  Ask people who smoke to not smoke around you.  Avoid places that make you want (trigger) to smoke, such as: ? Bars. ? Parties. ? Smoke-break areas at work.  Spend time with people who do not smoke.  Lower the stress in your life. Stress can make you want to smoke. Try these things to help your stress: ? Getting regular exercise. ? Deep-breathing exercises. ? Yoga. ? Meditating. ?  Doing a body scan. To do this, close your eyes, focus on one area of your body at a time from head to toe, and notice which parts of your body are tense. Try to relax the muscles in those areas.  Download or buy apps on your mobile phone or tablet that can help you stick to your quit plan. There are many free apps, such as QuitGuide from the [employer] developer for Disease Control and Prevention). You can find more support from smokefree.gov and other websites. This information is not intended to replace advice given to you by your health care provider. Make sure you discuss  any questions you have with your health care provider. Document Released: 01/12/2009 Document Revised: 11/14/2015 Document Reviewed: 08/02/2014 Elsevier Interactive Patient Education  2019 [employer].

## 2018-05-05 NOTE — Progress Notes (Signed)
Patient ID: Jeremy Hancock, male    DOB: 01/01/1971, 48 y.o.   MRN: [id number]  PCP: Danelle Berry, PA-C  Chief Complaint  Patient presents with  . Fatigue    Patient in today for a recheck. Has c/o SOB and fatigue     Subjective:   Jeremy Hancock is a 48 y.o. male, presents to clinic with CC of improving sinus and ear issues, worsening breathing, with improved cough since 04/21/2018, but worsening SOB, wheeze and palpitations.   Coughing up phlegm - mostly clear, but it's slowed down after treating sinuses and taking prednisone Intermittent coughing, has gotten better Still smoking 1 ppd, cut back from 1.5 ppd Sitting around sometimes he says his "breathing is hard", he feels fatigued a lot recently - especially since his girfriend broke up with him.  HE says he's been down about it, and its been hard for him, he thinks that part of why is been tired, laying around a lot, not wanting to do anything and he is just starting to "come out of it." No CP, fever, H/C chills, sweats, palpitations, lower extremity edema, orthopnea, PND, near syncope, weight loss, rash, abd pain, N/V/D  When I ask more details about his breathing complaints, asking what activity make you short of breath he says he's "always SOB", hes having to take breaks at work to rest Inhaler helps when he uses it - still using every 4 hours.  Hx of CAD - mild disease with stress test and cardiac cath in 2018, strong family hx of CAD - early onset   Patient Active Problem List   Diagnosis Date Noted  . Chest pain 05/17/2016  . Hyperlipidemia 07/28/2014  . Family history of premature coronary artery disease 07/28/2014  . Benign paroxysmal positional vertigo 07/28/2014  . 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 Hx MRSA infection   . Arthritis      Prior to Admission medications   Medication Sig Start Date End Date Taking? Authorizing Provider  albuterol (PROVENTIL HFA;VENTOLIN HFA) 108 (90 Base) MCG/ACT inhaler  Inhale 2 puffs into the lungs every 4 (four) hours as needed for wheezing or shortness of breath. 04/21/18  Yes Danelle Berry, PA-C  azelastine (ASTELIN) 0.1 % nasal spray Place 2 sprays into both nostrils 2 (two) times daily. Use in each nostril as directed 04/21/18  Yes Melvie Paglia, PA-C  benzonatate (TESSALON) 100 MG capsule Take 1 capsule (100 mg total) by mouth 3 (three) times daily as needed for cough. 04/21/18  Yes Danelle Berry, PA-C  Multiple Vitamins-Minerals (MULTIVITAMIN WITH MINERALS) tablet Take 1 tablet by mouth daily.   Yes [provider]     Allergies  Allergen Reactions  . Amoxicillin Other (See Comments)    Severe abd pain  . Codeine   . Hydrocodone-Acetaminophen Hives  . Statins Other (See Comments)    Severe myalgias  . Zyrtec [Cetirizine] Other (See Comments)     Family History  Problem Relation Age of Onset  . Cancer Mother   . Hearing loss Mother   . Diabetes Father   . Heart disease Father   . Hyperlipidemia Father      Social History   Socioeconomic History  . Marital status: Single    Spouse name: Not on file  . Number of children: 2  . Years of education: 15, GED  . Highest education level: Not on file  Occupational History  . Occupation: disabled  Social Needs  . Financial resource strain: Not on  file  . Food insecurity:    Worry: Not on file    Inability: Not on file  . Transportation needs:    Medical: Not on file    Non-medical: Not on file  Tobacco Use  . Smoking status: Current Every Day Smoker    Packs/day: 1.00    Types: Cigarettes  . Smokeless tobacco: Never Used  Substance and Sexual Activity  . Alcohol use: Yes    Comment: occassional  . Drug use: No    Comment: in mid 20's  . Sexual activity: Yes    Birth control/protection: None  Lifestyle  . Physical activity:    Days per week: Not on file    Minutes per session: Not on file  . Stress: Not on file  Relationships  . Social connections:    Talks on phone:  Not on file    Gets together: Not on file    Attends religious service: Not on file    Active member of club or organization: Not on file    Attends meetings of clubs or organizations: Not on file    Relationship status: Not on file  . Intimate partner violence:    Fear of current or ex partner: Not on file    Emotionally abused: Not on file    Physically abused: Not on file    Forced sexual activity: Not on file  Other Topics Concern  . Not on file  Social History Narrative   Lives in home with father   Caffeine use - 2 L Mountain Dew daily     Review of Systems  Constitutional: Negative.   HENT: Negative.   Eyes: Negative.   Respiratory: Negative.   Cardiovascular: Negative.   Gastrointestinal: Negative.   Endocrine: Negative.   Genitourinary: Negative.   Musculoskeletal: Negative.   Skin: Negative.   Allergic/Immunologic: Negative.   Neurological: Negative.   Hematological: Negative.   Psychiatric/Behavioral: Negative.   All other systems reviewed and are negative.      Objective:    Vitals:   05/05/18 1006  BP: 122/74  Pulse: 67  Resp: 15  Temp: 98.3 F (36.8 C)  TempSrc: Oral  SpO2: 97%  Weight: 143 lb 2 oz (64.9 kg)      Physical Exam Vitals signs and nursing note reviewed.  Constitutional:      General: He is not in acute distress.    Appearance: Normal appearance. He is well-developed. He is not ill-appearing, toxic-appearing or diaphoretic.  HENT:     Head: Normocephalic and atraumatic.     Right Ear: Tympanic membrane, ear canal and external ear normal. There is no impacted cerumen.     Left Ear: Tympanic membrane, ear canal and external ear normal. There is no impacted cerumen.     Nose: Nose normal.     Mouth/Throat:     Mouth: Mucous membranes are moist.     Pharynx: Oropharynx is clear.  Eyes:     General: No scleral icterus.       Right eye: No discharge.        Left eye: No discharge.     Conjunctiva/sclera: Conjunctivae normal.       Pupils: Pupils are equal, round, and reactive to light.  Neck:     Musculoskeletal: Normal range of motion. No neck rigidity.     Trachea: No tracheal deviation.  Cardiovascular:     Rate and Rhythm: Normal rate and regular rhythm.     Pulses: Normal pulses.  Heart sounds: Normal heart sounds. No murmur. No friction rub. No gallop.   Pulmonary:     Effort: Pulmonary effort is normal. No respiratory distress.     Breath sounds: No stridor. No wheezing, rhonchi or rales.  Chest:     Chest wall: No tenderness.  Abdominal:     General: Abdomen is flat. Bowel sounds are normal. There is no distension.     Tenderness: There is no abdominal tenderness.  Musculoskeletal: Normal range of motion.  Lymphadenopathy:     Cervical: No cervical adenopathy.  Skin:    General: Skin is warm and dry.     Capillary Refill: Capillary refill takes less than 2 seconds.     Coloration: Skin is not pale.     Findings: No rash.  Neurological:     Mental Status: He is alert.     Motor: No abnormal muscle tone.     Coordination: Coordination normal.  Psychiatric:        Attention and Perception: He is inattentive.        Mood and Affect: Mood is depressed. Mood is not anxious. Affect is not tearful.        Speech: Speech is tangential.        Behavior: Behavior normal. Behavior is cooperative.        Thought Content: Thought content is not paranoid or delusional. Thought content does not include homicidal or suicidal ideation. Thought content does not include homicidal or suicidal plan.      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  He complains of palpitations associated with his fatigue and shortness of breath, he has history of chest pain in the past, he has a very difficult historian, he reports going to a cardiologist but apparently was unhappy with the care he received there, he is also unhappy with the care he received by his ENT though he is improved significantly since the first couple times I saw him. By reviewing chart do see that Frazier RichardsMary Beth Dixon referred him to cardiology in 2018 for shortness of breath and chest pain also a family history of premature CAD- left heart cath showed CAD in LAD only 30% stenosed.  Cardiologist notes he is statin intolerance, has a 50+ pack year history.  Because of his cardiac risk factors I did want to minimize any of his symptoms today since he is complaining of shortness of breath palpitations and fatigue I did a EKG follow-up chest x-ray.  Some of his symptoms may be due to some depression but other medical etiologies were worked up/ruled out today with the following:     ICD-10-CM   1. Palpitations R00.2 EKG 12-Lead    TSH  CBC with Differential    COMPLETE METABOLIC PANEL WITH GFR    Lipid Panel   check EKG - pt states neg cath and stress test 2 years ago  2. SOB (shortness of breath) R06.02 DG Chest 2 View    TSH    CBC with Differential    COMPLETE METABOLIC PANEL WITH GFR    Lipid Panel    budesonide-formoterol (SYMBICORT) 80-4.5 MCG/ACT inhaler   continued bronchitis?  COPD? check EKG, CXR  3. Current smoker F17.200 budesonide-formoterol (SYMBICORT) 80-4.5 MCG/ACT inhaler    encouraged to stop - suspect some COPD - referred for PFT's  4. Family history of premature coronary artery disease Z82.49 COMPLETE METABOLIC PANEL WITH GFR    Lipid Panel  5. Fatigue, unspecified type R53.83 TSH    CBC with Differential    Testosterone   check EKG, labs - r/o anemia, hypothyroid, low T, may be some depression w recent breakup    Will tx pt for continued bronchitis -he likely has COPD with his pack-year history and continued smoking, will do another steroid burst and while he is on that start a maintenance inhaler, will need to do follow-up PFTs after he is recovered from this illness.  EKG -no signs of ACS, and history not suspicious for, but KEG when compared to past does show some progressive changes in his electrical conduction- ie: RBBB - suggested he f/up with cardiology  Pt counseled on smoking cessation - offered support, medications.  Hand out printed for him  Follow up PFT's  Pt not fasting will need to return ASAP for fasting labs  Danelle Berry, PA-C 05/05/18 10:18 AM

## 2018-07-29 ENCOUNTER — Other Ambulatory Visit: Payer: Self-pay | Admitting: Family Medicine

## 2018-07-29 NOTE — Telephone Encounter (Signed)
Requested Prescriptions   Pending Prescriptions Disp Refills  . montelukast (SINGULAIR) 10 MG tablet [Pharmacy Med Name: MONTELUKAST 10MG  TABLETS] 30 tablet 3    Sig: TAKE 1 TABLET(10 MG) BY MOUTH AT BEDTIME   Last OV 05/05/2018  It was discontinued on 04/21/2018 by pt preference. Do you want to refill?

## 2019-05-23 ENCOUNTER — Emergency Department (HOSPITAL_COMMUNITY)
Admission: EM | Admit: 2019-05-23 | Discharge: 2019-05-23 | Disposition: A | Discharge status: Home / Self Care | Payer: Medicare HMO | Attending: Emergency Medicine | Admitting: Emergency Medicine

## 2019-05-23 ENCOUNTER — Encounter (HOSPITAL_COMMUNITY): Payer: Self-pay | Admitting: Emergency Medicine

## 2019-05-23 ENCOUNTER — Other Ambulatory Visit: Payer: Self-pay

## 2019-05-23 ENCOUNTER — Emergency Department (HOSPITAL_COMMUNITY): Payer: Medicare HMO

## 2019-05-23 DIAGNOSIS — Y929 Unspecified place or not applicable: Secondary | ICD-10-CM | POA: Diagnosis not present

## 2019-05-23 DIAGNOSIS — Y999 Unspecified external cause status: Secondary | ICD-10-CM | POA: Insufficient documentation

## 2019-05-23 DIAGNOSIS — F17210 Nicotine dependence, cigarettes, uncomplicated: Secondary | ICD-10-CM | POA: Insufficient documentation

## 2019-05-23 DIAGNOSIS — S82041A Displaced comminuted fracture of right patella, initial encounter for closed fracture: Secondary | ICD-10-CM | POA: Insufficient documentation

## 2019-05-23 DIAGNOSIS — S8991XA Unspecified injury of right lower leg, initial encounter: Secondary | ICD-10-CM | POA: Diagnosis present

## 2019-05-23 DIAGNOSIS — W19XXXA Unspecified fall, initial encounter: Secondary | ICD-10-CM | POA: Diagnosis not present

## 2019-05-23 DIAGNOSIS — S82001A Unspecified fracture of right patella, initial encounter for closed fracture: Secondary | ICD-10-CM | POA: Diagnosis not present

## 2019-05-23 DIAGNOSIS — R69 Illness, unspecified: Secondary | ICD-10-CM | POA: Diagnosis not present

## 2019-05-23 DIAGNOSIS — Y9389 Activity, other specified: Secondary | ICD-10-CM | POA: Diagnosis not present

## 2019-05-23 MED ORDER — HYDROCODONE-ACETAMINOPHEN 5-325 MG PO TABS: 1.0000 | ORAL_TABLET | ORAL | 0 refills | Status: DC | PRN

## 2019-05-23 MED ORDER — OXYCODONE-ACETAMINOPHEN 5-325 MG PO TABS: 1.0000 | ORAL_TABLET | ORAL | 0 refills | Status: DC | PRN

## 2019-05-23 NOTE — Discharge Instructions (Signed)
Return if any problems.
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
## 2019-05-23 NOTE — ED Notes (Signed)
KS in to assess 
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
## 2019-05-23 NOTE — ED Triage Notes (Signed)
Pt reports standing in one place and his right knee popped a few days ago. Now is swollen, painful, and bruised.

## 2019-05-24 ENCOUNTER — Telehealth: Payer: Self-pay | Admitting: Radiology

## 2019-05-24 ENCOUNTER — Encounter (HOSPITAL_COMMUNITY)
Admission: RE | Admit: 2019-05-24 | Discharge: 2019-05-24 | Disposition: A | Discharge status: Home / Self Care | Payer: Medicare HMO | Source: Ambulatory Visit | Attending: Orthopedic Surgery | Admitting: Orthopedic Surgery

## 2019-05-24 ENCOUNTER — Encounter (HOSPITAL_COMMUNITY): Payer: Self-pay

## 2019-05-24 ENCOUNTER — Other Ambulatory Visit (HOSPITAL_COMMUNITY)
Admission: RE | Admit: 2019-05-24 | Discharge: 2019-05-24 | Disposition: A | Discharge status: Home / Self Care | Payer: Medicare HMO | Source: Ambulatory Visit | Attending: Orthopedic Surgery | Admitting: Orthopedic Surgery

## 2019-05-24 ENCOUNTER — Ambulatory Visit: Payer: Medicare HMO | Admitting: Orthopedic Surgery

## 2019-05-24 VITALS — BP 121/75 | HR 63 | Temp 97.3°F | Ht 65.0 in

## 2019-05-24 DIAGNOSIS — Z01812 Encounter for preprocedural laboratory examination: Secondary | ICD-10-CM | POA: Insufficient documentation

## 2019-05-24 DIAGNOSIS — Z20822 Contact with and (suspected) exposure to covid-19: Secondary | ICD-10-CM | POA: Insufficient documentation

## 2019-05-24 DIAGNOSIS — S82041A Displaced comminuted fracture of right patella, initial encounter for closed fracture: Secondary | ICD-10-CM | POA: Diagnosis not present

## 2019-05-24 LAB — CBC WITH DIFFERENTIAL/PLATELET
Abs Immature Granulocytes: 0.04 10*3/uL (ref 0.00–0.07)
Basophils Absolute: 0.1 10*3/uL (ref 0.0–0.1)
Basophils Relative: 1 %
Eosinophils Absolute: 0.3 10*3/uL (ref 0.0–0.5)
Eosinophils Relative: 3 %
HCT: 47.2 % (ref 39.0–52.0)
Hemoglobin: 15.3 g/dL (ref 13.0–17.0)
Immature Granulocytes: 0 %
Lymphocytes Relative: 21 %
Lymphs Abs: 2 10*3/uL (ref 0.7–4.0)
MCH: 30.4 pg (ref 26.0–34.0)
MCHC: 32.4 g/dL (ref 30.0–36.0)
MCV: 93.8 fL (ref 80.0–100.0)
Monocytes Absolute: 0.8 10*3/uL (ref 0.1–1.0)
Monocytes Relative: 8 %
Neutro Abs: 6.7 10*3/uL (ref 1.7–7.7)
Neutrophils Relative %: 67 %
Platelets: 254 10*3/uL (ref 150–400)
RBC: 5.03 MIL/uL (ref 4.22–5.81)
RDW: 13.2 % (ref 11.5–15.5)
WBC: 9.9 10*3/uL (ref 4.0–10.5)
nRBC: 0 % (ref 0.0–0.2)

## 2019-05-24 LAB — BASIC METABOLIC PANEL
Anion gap: 10 (ref 5–15)
BUN: 12 mg/dL (ref 6–20)
CO2: 26 mmol/L (ref 22–32)
Calcium: 9.3 mg/dL (ref 8.9–10.3)
Chloride: 104 mmol/L (ref 98–111)
Creatinine, Ser: 0.84 mg/dL (ref 0.61–1.24)
GFR calc Af Amer: >60 mL/min (ref >60)
GFR calc non Af Amer: >60 mL/min (ref >60)
Glucose, Bld: 84 mg/dL (ref 70–99)
Potassium: 3.8 mmol/L (ref 3.5–5.1)
Sodium: 140 mmol/L (ref 135–145)

## 2019-05-24 LAB — SARS CORONAVIRUS 2 (TAT 6-24 HRS): SARS Coronavirus 2: NEGATIVE
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
## 2019-05-24 NOTE — Telephone Encounter (Signed)
Per [employer] automated system Authorization not required for surgery [phone] since Dr Romeo Apple is participating provider.

## 2019-05-24 NOTE — Patient Instructions (Signed)
Jeremy Hancock  05/24/2019     @PREFPERIOPPHARMACY @   Your procedure is scheduled on  05/26/2019 .  Report to [facility] at  1100  A.M.  Call this number if you have problems the morning of surgery:  [phone]   Remember:  Do not eat or drink after midnight.                         Take these medicines the morning of surgery with A SIP OF WATER  Oxycodone.    Do not wear jewelry, make-up or nail polish.  Do not wear lotions, powders, or perfumes. Please wear deodorant and brush your teeth.  Do not shave 48 hours prior to surgery.  Men may shave face and neck.  Do not bring valuables to the hospital.  [facility] is not responsible for any belongings or valuables.  Contacts, dentures or bridgework may not be worn into surgery.  Leave your suitcase in the car.  After surgery it may be brought to your room.  For patients admitted to the hospital, discharge time will be determined by your treatment team.  Patients discharged the day of surgery will not be allowed to drive home.   Name and phone number of your driver:   family Special instructions:  DO NOT smoke the morning of your procedure.  Please read over the following fact sheets that you were given. Anesthesia Post-op Instructions and Care and Recovery After Surgery       Patellar Fracture, Adult  A patellar fracture is a break in the kneecap (patella). The patella is located on the front of the knee. A patellar fracture may make it difficult to walk. What are the causes? This condition may be caused by:  A fall or a hard, direct hit (blow) to the knee.  A very hard and strong bending of the knee. What increases the risk? The following factors make you more likely to experience a patellar fracture:  Playing contact sports or motor sports, especially sports that involve a lot of jumping.  Having bone abnormalities or diseases of the bone, such as osteoporosis or a bone tumor.  Having poor  strength and flexibility.  Having metabolism disorders, hormone problems, or nutrition deficiencies and disorders, such as anorexia or bulimia. What are the signs or symptoms? Symptoms of this condition include:  Tender and swollen knee.  Pain when moving the knee, especially when straightening it.  Difficulty walking or using the knee to support body weight (bearing weight).  Misshapen knee, as if a bone is out of place. How is this diagnosed? This condition is diagnosed based on:  Your symptoms and medical history.  A physical exam.  X-rays. How is this treated? Treatment for this condition depends on the type of fracture that you have:  If your patella is still in the right position after the fracture and you can still straighten your leg, you may need to wear a splint or cast for 4-6 weeks.  If your patella is broken into multiple pieces but you are able to straighten your leg, you can usually be treated with a splint or cast for 4-6 weeks. In some cases, the patella may need to be removed before the cast is applied.  If you cannot straighten your leg after a patellar fracture, you will need to have surgery to hold the patella together until it heals. After surgery, a splint  or cast will be applied for 4-6 weeks.  You may be prescribed medicine to help relieve pain or prevent infection. Follow these instructions at home: If you have a splint:  Wear the splint as told by your health care provider. Remove it only as told by your health care provider.  Loosen the splint if your toes tingle, become numb, or turn cold and blue.  Keep the splint clean.  If the splint is not waterproof: ? Do not let it get wet. ? Cover it with a watertight covering when you take a bath or a shower. If you have a cast:  Do not stick anything inside the cast to scratch your skin. Doing that increases your risk of infection.  Check the skin around the cast every day. Tell your health care  provider about any concerns.  You may put lotion on dry skin around the edges of the cast. Do not put lotion on the skin underneath the cast.  Keep the cast clean.  If the cast is not waterproof: ? Do not let it get wet. ? Cover it with a watertight covering when you take a bath or a shower. Managing pain, stiffness, and swelling   If directed, put ice on the injured area: ? If you have a removable splint, remove it as told by your health care provider. ? Put ice in a plastic bag. ? Place a towel between your skin and the bag or between your cast and the bag. ? Leave the ice on for 20 minutes, 2-3 times a day.  Move your toes often to avoid stiffness and to lessen swelling.  Raise (elevate) the injured area above the level of your heart while you are sitting or lying down. Medicines  Take over-the-counter and prescription medicines only as told by your health care provider.  If you were prescribed an antibiotic medicine, use it as told by your health care provider. Do not stop taking the antibiotic even if you start to feel better. Activity  Return to your normal activities as told by your health care provider. Ask your health care provider what activities are safe for you.  Do exercises as told by your health care provider.  Ask your health care provider when it is safe to drive if you have a splint or cast on your leg.  Do not drive or use heavy machinery while taking prescription pain medicine. General instructions  Do not use the injured limb to support your body weight until your health care provider says that you can. Use crutches as told by your health care provider.  Do not use any products that contain nicotine or tobacco, such as cigarettes and e-cigarettes. These can delay bone healing. If you need help quitting, ask your health care provider.  Keep all follow-up visits as told by your health care provider. This is important. Contact a health care provider  if:  You have symptoms that get worse or do not get better after 2 weeks of treatment.  You have severe, persistent pain. Get help right away if:  You have redness, swelling, or increasing pain in your knee.  You have a fever.  You have blue or gray skin below the fracture site or in the toes.  You have numbness or loss of feeling below the fracture site. This information is not intended to replace advice given to you by your health care provider. Make sure you discuss any questions you have with your health care provider. Document  Revised: 02/28/2017 Document Reviewed: 12/29/2015 Elsevier Patient Education  2020 [employer]  General Anesthesia, Adult, Care After This sheet gives you information about how to care for yourself after your procedure. Your health care provider may also give you more specific instructions. If you have problems or questions, contact your health care provider. What can I expect after the procedure? After the procedure, the following side effects are common:  Pain or discomfort at the IV site.  Nausea.  Vomiting.  Sore throat.  Trouble concentrating.  Feeling cold or chills.  Weak or tired.  Sleepiness and fatigue.  Soreness and body aches. These side effects can affect parts of the body that were not involved in surgery. Follow these instructions at home:  For at least 24 hours after the procedure:  Have a responsible adult stay with you. It is important to have someone help care for you until you are awake and alert.  Rest as needed.  Do not: ? Participate in activities in which you could fall or become injured. ? Drive. ? Use heavy machinery. ? Drink alcohol. ? Take sleeping pills or medicines that cause drowsiness. ? Make important decisions or sign legal documents. ? Take care of children on your own. Eating and drinking  Follow any instructions from your health care provider about eating or drinking restrictions.  When you  feel hungry, start by eating small amounts of foods that are soft and easy to digest (bland), such as toast. Gradually return to your regular diet.  Drink enough fluid to keep your urine pale yellow.  If you vomit, rehydrate by drinking water, juice, or clear broth. General instructions  If you have sleep apnea, surgery and certain medicines can increase your risk for breathing problems. Follow instructions from your health care provider about wearing your sleep device: ? Anytime you are sleeping, including during daytime naps. ? While taking prescription pain medicines, sleeping medicines, or medicines that make you drowsy.  Return to your normal activities as told by your health care provider. Ask your health care provider what activities are safe for you.  Take over-the-counter and prescription medicines only as told by your health care provider.  If you smoke, do not smoke without supervision.  Keep all follow-up visits as told by your health care provider. This is important. Contact a health care provider if:  You have nausea or vomiting that does not get better with medicine.  You cannot eat or drink without vomiting.  You have pain that does not get better with medicine.  You are unable to pass urine.  You develop a skin rash.  You have a fever.  You have redness around your IV site that gets worse. Get help right away if:  You have difficulty breathing.  You have chest pain.  You have blood in your urine or stool, or you vomit blood. Summary  After the procedure, it is common to have a sore throat or nausea. It is also common to feel tired.  Have a responsible adult stay with you for the first 24 hours after general anesthesia. It is important to have someone help care for you until you are awake and alert.  When you feel hungry, start by eating small amounts of foods that are soft and easy to digest (bland), such as toast. Gradually return to your regular  diet.  Drink enough fluid to keep your urine pale yellow.  Return to your normal activities as told by your health care provider. Ask  your health care provider what activities are safe for you. This information is not intended to replace advice given to you by your health care provider. Make sure you discuss any questions you have with your health care provider. Document Revised: 03/21/2017 Document Reviewed: 11/01/2016 Elsevier Patient Education  2020 [employer]. How to Use Chlorhexidine for Bathing Chlorhexidine gluconate (CHG) is a germ-killing (antiseptic) solution that is used to clean the skin. It can get rid of the bacteria that normally live on the skin and can keep them away for about 24 hours. To clean your skin with CHG, you may be given:  A CHG solution to use in the shower or as part of a sponge bath.  A prepackaged cloth that contains CHG. Cleaning your skin with CHG may help lower the risk for infection:  While you are staying in the intensive care unit of the hospital.  If you have a vascular access, such as a central line, to provide short-term or long-term access to your veins.  If you have a catheter to drain urine from your bladder.  If you are on a ventilator. A ventilator is a machine that helps you breathe by moving air in and out of your lungs.  After surgery. What are the risks? Risks of using CHG include:  A skin reaction.  Hearing loss, if CHG gets in your ears.  Eye injury, if CHG gets in your eyes and is not rinsed out.  The CHG product catching fire. Make sure that you avoid smoking and flames after applying CHG to your skin. Do not use CHG:  If you have a chlorhexidine allergy or have previously reacted to chlorhexidine.  On babies younger than 67 months of age. How to use CHG solution  Use CHG only as told by your health care provider, and follow the instructions on the label.  Use the full amount of CHG as directed. Usually, this is one  bottle. During a shower Follow these steps when using CHG solution during a shower (unless your health care provider gives you different instructions): 1. Start the shower. 2. Use your normal soap and shampoo to wash your face and hair. 3. Turn off the shower or move out of the shower stream. 4. Pour the CHG onto a clean washcloth. Do not use any type of brush or rough-edged sponge. 5. Starting at your neck, lather your body down to your toes. Make sure you follow these instructions: ? If you will be having surgery, pay special attention to the part of your body where you will be having surgery. Scrub this area for at least 1 minute. ? Do not use CHG on your head or face. If the solution gets into your ears or eyes, rinse them well with water. ? Avoid your genital area. ? Avoid any areas of skin that have broken skin, cuts, or scrapes. ? Scrub your back and under your arms. Make sure to wash skin folds. 6. Let the lather sit on your skin for 1-2 minutes or as long as told by your health care provider. 7. Thoroughly rinse your entire body in the shower. Make sure that all body creases and crevices are rinsed well. 8. Dry off with a clean towel. Do not put any substances on your body afterward--such as powder, lotion, or perfume--unless you are told to do so by your health care provider. Only use lotions that are recommended by the manufacturer. 9. Put on clean clothes or pajamas. 10. If it is  the night before your surgery, sleep in clean sheets.  During a sponge bath Follow these steps when using CHG solution during a sponge bath (unless your health care provider gives you different instructions): 1. Use your normal soap and shampoo to wash your face and hair. 2. Pour the CHG onto a clean washcloth. 3. Starting at your neck, lather your body down to your toes. Make sure you follow these instructions: ? If you will be having surgery, pay special attention to the part of your body where you will  be having surgery. Scrub this area for at least 1 minute. ? Do not use CHG on your head or face. If the solution gets into your ears or eyes, rinse them well with water. ? Avoid your genital area. ? Avoid any areas of skin that have broken skin, cuts, or scrapes. ? Scrub your back and under your arms. Make sure to wash skin folds. 4. Let the lather sit on your skin for 1-2 minutes or as long as told by your health care provider. 5. Using a different clean, wet washcloth, thoroughly rinse your entire body. Make sure that all body creases and crevices are rinsed well. 6. Dry off with a clean towel. Do not put any substances on your body afterward--such as powder, lotion, or perfume--unless you are told to do so by your health care provider. Only use lotions that are recommended by the manufacturer. 7. Put on clean clothes or pajamas. 8. If it is the night before your surgery, sleep in clean sheets. How to use CHG prepackaged cloths  Only use CHG cloths as told by your health care provider, and follow the instructions on the label.  Use the CHG cloth on clean, dry skin.  Do not use the CHG cloth on your head or face unless your health care provider tells you to.  When washing with the CHG cloth: ? Avoid your genital area. ? Avoid any areas of skin that have broken skin, cuts, or scrapes. Before surgery Follow these steps when using a CHG cloth to clean before surgery (unless your health care provider gives you different instructions): 1. Using the CHG cloth, vigorously scrub the part of your body where you will be having surgery. Scrub using a back-and-forth motion for 3 minutes. The area on your body should be completely wet with CHG when you are done scrubbing. 2. Do not rinse. Discard the cloth and let the area air-dry. Do not put any substances on the area afterward, such as powder, lotion, or perfume. 3. Put on clean clothes or pajamas. 4. If it is the night before your surgery, sleep in  clean sheets.  For general bathing Follow these steps when using CHG cloths for general bathing (unless your health care provider gives you different instructions). 1. Use a separate CHG cloth for each area of your body. Make sure you wash between any folds of skin and between your fingers and toes. Wash your body in the following order, switching to a new cloth after each step: ? The front of your neck, shoulders, and chest. ? Both of your arms, under your arms, and your hands. ? Your stomach and groin area, avoiding the genitals. ? Your right leg and foot. ? Your left leg and foot. ? The back of your neck, your back, and your buttocks. 2. Do not rinse. Discard the cloth and let the area air-dry. Do not put any substances on your body afterward--such as powder, lotion, or perfume--unless  you are told to do so by your health care provider. Only use lotions that are recommended by the manufacturer. 3. Put on clean clothes or pajamas. Contact a health care provider if:  Your skin gets irritated after scrubbing.  You have questions about using your solution or cloth. Get help right away if:  Your eyes become very red or swollen.  Your eyes itch badly.  Your skin itches badly and is red or swollen.  Your hearing changes.  You have trouble seeing.  You have swelling or tingling in your mouth or throat.  You have trouble breathing.  You swallow any chlorhexidine. Summary  Chlorhexidine gluconate (CHG) is a germ-killing (antiseptic) solution that is used to clean the skin. Cleaning your skin with CHG may help to lower your risk for infection.  You may be given CHG to use for bathing. It may be in a bottle or in a prepackaged cloth to use on your skin. Carefully follow your health care provider's instructions and the instructions on the product label.  Do not use CHG if you have a chlorhexidine allergy.  Contact your health care provider if your skin gets irritated after  scrubbing. This information is not intended to replace advice given to you by your health care provider. Make sure you discuss any questions you have with your health care provider. Document Revised: 06/04/2018 Document Reviewed: 02/13/2017 Elsevier Patient Education  2020 [employer]

## 2019-05-26 ENCOUNTER — Ambulatory Visit (HOSPITAL_COMMUNITY): Payer: Medicare HMO | Admitting: Anesthesiology

## 2019-05-26 ENCOUNTER — Encounter (HOSPITAL_COMMUNITY): Payer: Self-pay | Admitting: Orthopedic Surgery

## 2019-05-26 ENCOUNTER — Ambulatory Visit (HOSPITAL_COMMUNITY): Payer: Medicare HMO

## 2019-05-26 ENCOUNTER — Encounter (HOSPITAL_COMMUNITY)
Admission: RE | Disposition: A | Discharge status: Home / Self Care | Payer: Self-pay | Source: Home / Self Care | Attending: Orthopedic Surgery

## 2019-05-26 ENCOUNTER — Ambulatory Visit (HOSPITAL_COMMUNITY)
Admission: RE | Admit: 2019-05-26 | Discharge: 2019-05-26 | Disposition: A | Discharge status: Home / Self Care | Payer: Medicare HMO | Attending: Orthopedic Surgery | Admitting: Orthopedic Surgery

## 2019-05-26 DIAGNOSIS — Z7951 Long term (current) use of inhaled steroids: Secondary | ICD-10-CM | POA: Diagnosis not present

## 2019-05-26 DIAGNOSIS — Z8614 Personal history of Methicillin resistant Staphylococcus aureus infection: Secondary | ICD-10-CM | POA: Insufficient documentation

## 2019-05-26 DIAGNOSIS — Z7952 Long term (current) use of systemic steroids: Secondary | ICD-10-CM | POA: Diagnosis not present

## 2019-05-26 DIAGNOSIS — S82041A Displaced comminuted fracture of right patella, initial encounter for closed fracture: Secondary | ICD-10-CM | POA: Diagnosis not present

## 2019-05-26 DIAGNOSIS — X58XXXA Exposure to other specified factors, initial encounter: Secondary | ICD-10-CM | POA: Insufficient documentation

## 2019-05-26 DIAGNOSIS — F17200 Nicotine dependence, unspecified, uncomplicated: Secondary | ICD-10-CM | POA: Insufficient documentation

## 2019-05-26 DIAGNOSIS — Z20822 Contact with and (suspected) exposure to covid-19: Secondary | ICD-10-CM | POA: Diagnosis not present

## 2019-05-26 DIAGNOSIS — I2510 Atherosclerotic heart disease of native coronary artery without angina pectoris: Secondary | ICD-10-CM | POA: Diagnosis not present

## 2019-05-26 DIAGNOSIS — Z880 Allergy status to penicillin: Secondary | ICD-10-CM | POA: Insufficient documentation

## 2019-05-26 DIAGNOSIS — Z885 Allergy status to narcotic agent status: Secondary | ICD-10-CM | POA: Insufficient documentation

## 2019-05-26 DIAGNOSIS — S82001A Unspecified fracture of right patella, initial encounter for closed fracture: Secondary | ICD-10-CM | POA: Diagnosis not present

## 2019-05-26 DIAGNOSIS — S82041D Displaced comminuted fracture of right patella, subsequent encounter for closed fracture with routine healing: Secondary | ICD-10-CM

## 2019-05-26 DIAGNOSIS — Y9301 Activity, walking, marching and hiking: Secondary | ICD-10-CM | POA: Insufficient documentation

## 2019-05-26 DIAGNOSIS — R69 Illness, unspecified: Secondary | ICD-10-CM | POA: Diagnosis not present

## 2019-05-26 DIAGNOSIS — Z888 Allergy status to other drugs, medicaments and biological substances status: Secondary | ICD-10-CM | POA: Insufficient documentation

## 2019-05-26 DIAGNOSIS — Z79899 Other long term (current) drug therapy: Secondary | ICD-10-CM | POA: Diagnosis not present

## 2019-05-26 DIAGNOSIS — M1990 Unspecified osteoarthritis, unspecified site: Secondary | ICD-10-CM | POA: Insufficient documentation

## 2019-05-26 HISTORY — PX: ORIF PATELLA: SHX5033

## 2019-05-26 SURGERY — OPEN REDUCTION INTERNAL FIXATION (ORIF) PATELLA
Anesthesia: General | Site: Knee | Laterality: Right

## 2019-05-26 MED ORDER — LACTATED RINGERS IV SOLN
INTRAVENOUS | Status: DC
  Administered 2019-05-26: 12:00:00 1000 mL via INTRAVENOUS

## 2019-05-26 MED ORDER — ONDANSETRON HCL 4 MG/2ML IJ SOLN
INTRAMUSCULAR | Status: AC
  Filled 2019-05-26: qty 2

## 2019-05-26 MED ORDER — BUPIVACAINE-EPINEPHRINE (PF) 0.5% -1:200000 IJ SOLN
INTRAMUSCULAR | Status: DC | PRN
  Administered 2019-05-26: 20 mL

## 2019-05-26 MED ORDER — MIDAZOLAM HCL 2 MG/2ML IJ SOLN: 0.5000 mg | Freq: Once | INTRAMUSCULAR | Status: DC | PRN

## 2019-05-26 MED ORDER — SUCCINYLCHOLINE CHLORIDE 200 MG/10ML IV SOSY
PREFILLED_SYRINGE | INTRAVENOUS | Status: DC | PRN
  Administered 2019-05-26: 120 mg via INTRAVENOUS

## 2019-05-26 MED ORDER — SODIUM CHLORIDE 0.9 % IR SOLN
Status: DC | PRN
  Administered 2019-05-26: 1000 mL

## 2019-05-26 MED ORDER — FENTANYL CITRATE (PF) 100 MCG/2ML IJ SOLN
INTRAMUSCULAR | Status: DC | PRN
  Administered 2019-05-26 (×2): 50 ug via INTRAVENOUS
  Administered 2019-05-26: 75 ug via INTRAVENOUS
  Administered 2019-05-26: 25 ug via INTRAVENOUS

## 2019-05-26 MED ORDER — OXYCODONE-ACETAMINOPHEN 5-325 MG PO TABS
1.0000 | ORAL_TABLET | ORAL | Status: DC | PRN
  Administered 2019-05-26: 1 via ORAL
  Filled 2019-05-26: qty 1

## 2019-05-26 MED ORDER — SUCCINYLCHOLINE CHLORIDE 200 MG/10ML IV SOSY
PREFILLED_SYRINGE | INTRAVENOUS | Status: AC
  Filled 2019-05-26: qty 10

## 2019-05-26 MED ORDER — LIDOCAINE HCL URETHRAL/MUCOSAL 2 % EX GEL
CUTANEOUS | Status: DC | PRN
  Administered 2019-05-26: 60 via TOPICAL

## 2019-05-26 MED ORDER — BUPIVACAINE LIPOSOME 1.3 % IJ SUSP
INTRAMUSCULAR | Status: DC | PRN
  Administered 2019-05-26: 20 mL

## 2019-05-26 MED ORDER — LIDOCAINE 2% (20 MG/ML) 5 ML SYRINGE
INTRAMUSCULAR | Status: AC
  Filled 2019-05-26: qty 5

## 2019-05-26 MED ORDER — PROPOFOL 10 MG/ML IV BOLUS
INTRAVENOUS | Status: DC | PRN
  Administered 2019-05-26: 200 mg via INTRAVENOUS

## 2019-05-26 MED ORDER — MIDAZOLAM HCL 2 MG/2ML IJ SOLN
INTRAMUSCULAR | Status: AC
  Filled 2019-05-26: qty 2

## 2019-05-26 MED ORDER — MIDAZOLAM HCL 5 MG/5ML IJ SOLN
INTRAMUSCULAR | Status: DC | PRN
  Administered 2019-05-26: 2 mg via INTRAVENOUS

## 2019-05-26 MED ORDER — FENTANYL CITRATE (PF) 250 MCG/5ML IJ SOLN
INTRAMUSCULAR | Status: AC
  Filled 2019-05-26: qty 5

## 2019-05-26 MED ORDER — BUPIVACAINE LIPOSOME 1.3 % IJ SUSP
INTRAMUSCULAR | Status: AC
  Filled 2019-05-26: qty 20

## 2019-05-26 MED ORDER — VANCOMYCIN HCL IN DEXTROSE 1-5 GM/200ML-% IV SOLN
1000.0000 mg | INTRAVENOUS | Status: AC
  Administered 2019-05-26: 1000 mg via INTRAVENOUS
  Filled 2019-05-26: qty 200

## 2019-05-26 MED ORDER — OXYCODONE-ACETAMINOPHEN 5-325 MG PO TABS: 1.0000 | ORAL_TABLET | ORAL | 0 refills | Status: DC | PRN

## 2019-05-26 MED ORDER — IBUPROFEN 800 MG PO TABS
800.0000 mg | ORAL_TABLET | Freq: Once | ORAL | Status: AC
  Administered 2019-05-26: 800 mg via ORAL
  Filled 2019-05-26: qty 1

## 2019-05-26 MED ORDER — BUPIVACAINE-EPINEPHRINE (PF) 0.5% -1:200000 IJ SOLN
INTRAMUSCULAR | Status: AC
  Filled 2019-05-26: qty 30

## 2019-05-26 MED ORDER — PROMETHAZINE HCL 25 MG/ML IJ SOLN: 6.2500 mg | INTRAMUSCULAR | Status: DC | PRN

## 2019-05-26 MED ORDER — BUPIVACAINE HCL (PF) 0.5 % IJ SOLN
INTRAMUSCULAR | Status: AC
  Filled 2019-05-26: qty 30

## 2019-05-26 MED ORDER — PROPOFOL 10 MG/ML IV BOLUS
INTRAVENOUS | Status: AC
  Filled 2019-05-26: qty 20

## 2019-05-26 MED ORDER — CHLORHEXIDINE GLUCONATE 4 % EX LIQD: 60.0000 mL | Freq: Once | CUTANEOUS | Status: DC

## 2019-05-26 SURGICAL SUPPLY — 60 items
18 gauge wire ×1 IMPLANT
APL PRP STRL LF DISP 70% ISPRP (MISCELLANEOUS) ×1
BANDAGE ESMARK 6X9 LF (GAUZE/BANDAGES/DRESSINGS) ×1 IMPLANT
BLADE SURG SZ10 CARB STEEL (BLADE) ×2 IMPLANT
BNDG CMPR 9X6 STRL LF SNTH (GAUZE/BANDAGES/DRESSINGS) ×1
BNDG CMPR STD VLCR NS LF 5.8X4 (GAUZE/BANDAGES/DRESSINGS) ×2
BNDG CMPR STD VLCR NS LF 5.8X6 (GAUZE/BANDAGES/DRESSINGS) ×1
BNDG COHESIVE 4X5 TAN STRL (GAUZE/BANDAGES/DRESSINGS) ×2 IMPLANT
BNDG ELASTIC 4X5.8 VLCR NS LF (GAUZE/BANDAGES/DRESSINGS) ×4 IMPLANT
BNDG ELASTIC 6X5.8 VLCR NS LF (GAUZE/BANDAGES/DRESSINGS) ×2 IMPLANT
BNDG ESMARK 6X9 LF (GAUZE/BANDAGES/DRESSINGS) ×2
CHLORAPREP W/TINT 26 (MISCELLANEOUS) ×2 IMPLANT
CLOTH BEACON ORANGE TIMEOUT ST (SAFETY) ×2 IMPLANT
COVER LIGHT HANDLE STERIS (MISCELLANEOUS) ×4 IMPLANT
COVER WAND RF STERILE (DRAPES) ×2 IMPLANT
CUFF TOURN SGL QUICK 24 (TOURNIQUET CUFF) ×2
CUFF TOURN SGL QUICK 34 (TOURNIQUET CUFF)
CUFF TRNQT CYL 24X4X16.5-23 (TOURNIQUET CUFF) IMPLANT
CUFF TRNQT CYL 34X4.125X (TOURNIQUET CUFF) IMPLANT
DECANTER SPIKE VIAL GLASS SM (MISCELLANEOUS) ×3 IMPLANT
DRAPE C-ARM FOLDED MOBILE STRL (DRAPES) ×2 IMPLANT
DRAPE HALF SHEET 40X57 (DRAPES) ×4 IMPLANT
DRAPE INCISE IOBAN 66X45 STRL (DRAPES) ×2 IMPLANT
DRSG MEPILEX BORDER 4X12 (GAUZE/BANDAGES/DRESSINGS) ×2 IMPLANT
ELECT REM PT RETURN 9FT ADLT (ELECTROSURGICAL) ×2
ELECTRODE REM PT RTRN 9FT ADLT (ELECTROSURGICAL) ×1 IMPLANT
GAUZE SPONGE 4X4 12PLY STRL (GAUZE/BANDAGES/DRESSINGS) IMPLANT
GLOVE BIOGEL PI IND STRL 7.0 (GLOVE) ×3 IMPLANT
GLOVE BIOGEL PI INDICATOR 7.0 (GLOVE) ×3
GLOVE ECLIPSE 6.5 STRL STRAW (GLOVE) ×1 IMPLANT
GLOVE SKINSENSE NS SZ8.0 LF (GLOVE) ×1
GLOVE SKINSENSE STRL SZ8.0 LF (GLOVE) ×1 IMPLANT
GLOVE SS N UNI LF 8.5 STRL (GLOVE) ×2 IMPLANT
GOWN STRL REUS W/TWL LRG LVL3 (GOWN DISPOSABLE) ×4 IMPLANT
GOWN STRL REUS W/TWL XL LVL3 (GOWN DISPOSABLE) ×2 IMPLANT
IMMOBILIZER KNEE 19 UNV (ORTHOPEDIC SUPPLIES) ×2 IMPLANT
INST SET MINOR BONE (KITS) ×2 IMPLANT
K-WIRE DBL PT .062 (WIRE) ×3 IMPLANT
KIT TURNOVER KIT A (KITS) ×2 IMPLANT
MANIFOLD NEPTUNE II (INSTRUMENTS) ×2 IMPLANT
NDL HYPO 21X1.5 SAFETY (NEEDLE) ×1 IMPLANT
NEEDLE HYPO 21X1.5 SAFETY (NEEDLE) ×2 IMPLANT
NS IRRIG 1000ML POUR BTL (IV SOLUTION) ×2 IMPLANT
PACK BASIC LIMB (CUSTOM PROCEDURE TRAY) ×2 IMPLANT
PAD ARMBOARD 7.5X6 YLW CONV (MISCELLANEOUS) ×2 IMPLANT
PENCIL SMOKE EVACUATOR COATED (MISCELLANEOUS) ×1 IMPLANT
SET BASIN LINEN APH (SET/KITS/TRAYS/PACK) ×2 IMPLANT
SPONGE LAP 18X18 RF (DISPOSABLE) ×2 IMPLANT
STAPLER VISISTAT 35W (STAPLE) ×2 IMPLANT
SUT ETHIBOND 5 LR DA (SUTURE) ×2 IMPLANT
SUT ETHIBOND NAB OS 4 #2 30IN (SUTURE) ×1 IMPLANT
SUT MON AB 0 CT1 (SUTURE) ×2 IMPLANT
SUT MON AB 2-0 CT1 36 (SUTURE) ×1 IMPLANT
SUT STEEL 7 (SUTURE) ×1 IMPLANT
SUT VIC AB 1 CT1 27 (SUTURE) ×2
SUT VIC AB 1 CT1 27XBRD ANTBC (SUTURE) ×1 IMPLANT
SYR 20ML LL LF (SYRINGE) ×1 IMPLANT
SYR 30ML LL (SYRINGE) ×2 IMPLANT
SYR BULB IRRIGATION 50ML (SYRINGE) ×2 IMPLANT
TOWEL OR 17X26 4PK STRL BLUE (TOWEL DISPOSABLE) ×2 IMPLANT

## 2019-05-26 NOTE — Transfer of Care (Signed)
Immediate Anesthesia Transfer of Care Note  Patient: Jeremy Hancock  Procedure(s) Performed: OPEN REDUCTION INTERNAL (ORIF) FIXATION RIGHT PATELLA (Right Knee)  Patient Location: PACU  Anesthesia Type:General  Level of Consciousness: awake  Airway & Oxygen Therapy: Patient Spontanous Breathing  Post-op Assessment: Report given to RN  Post vital signs: Reviewed and stable  Last Vitals:  Vitals Value Taken Time  BP 117/84 05/26/19 1509  Temp 36.4 C 05/26/19 1509  Pulse 84 05/26/19 1511  Resp 14 05/26/19 1511  SpO2 99 % 05/26/19 1511  Vitals shown include unvalidated device data.  Last Pain:  Vitals:   05/26/19 1136  TempSrc: Oral  PainSc: 4       Patients Stated Pain Goal: 8 (05/26/19 1136)  Complications: No apparent anesthesia complications

## 2019-05-26 NOTE — Anesthesia Postprocedure Evaluation (Signed)
Anesthesia Post Note  Patient: Jeremy Hancock  Procedure(s) Performed: OPEN REDUCTION INTERNAL (ORIF) FIXATION RIGHT PATELLA (Right Knee)  Patient location during evaluation: PACU Anesthesia Type: General Level of consciousness: awake and alert and oriented Pain management: pain level controlled Vital Signs Assessment: post-procedure vital signs reviewed and stable Respiratory status: spontaneous breathing Cardiovascular status: blood pressure returned to baseline and stable Postop Assessment: no apparent nausea or vomiting Anesthetic complications: no     Last Vitals:  Vitals:   05/26/19 1540 05/26/19 1549  BP: 132/81 137/80  Pulse: 70 83  Resp: 18 16  Temp: 36.5 C 36.5 C  SpO2: 97% 99%    Last Pain:  Vitals:   05/26/19 1549  TempSrc: Axillary  PainSc: 5                  Angi Goodell

## 2019-05-26 NOTE — Interval H&P Note (Signed)
History and Physical Interval Note:  05/26/2019 1:32 PM  Jeremy Hancock  has presented today for surgery, with the diagnosis of fracture right patella.  The various methods of treatment have been discussed with the patient and family. After consideration of risks, benefits and other options for treatment, the patient has consented to  Procedure(s): OPEN REDUCTION INTERNAL (ORIF) FIXATION RIGHT PATELLA (Right) as a surgical intervention.  The patient's history has been reviewed, patient examined, no change in status, stable for surgery.  I have reviewed the patient's chart and labs.  Questions were answered to the patient's satisfaction.     Fuller Canada

## 2019-05-26 NOTE — Anesthesia Procedure Notes (Signed)
Procedure Name: Intubation Date/Time: 05/26/2019 1:44 PM Performed by: Ollen Bowl, CRNA Pre-anesthesia Checklist: Patient identified, Patient being monitored, Timeout performed, Emergency Drugs available and Suction available Patient Re-evaluated:Patient Re-evaluated prior to induction Oxygen Delivery Method: Circle system utilized Preoxygenation: Pre-oxygenation with 100% oxygen Induction Type: IV induction Ventilation: Mask ventilation without difficulty Laryngoscope Size: Mac and 3 Grade View: Grade I Tube type: Oral Tube size: 7.5 mm Number of attempts: 1 Airway Equipment and Method: Stylet Placement Confirmation: ETT inserted through vocal cords under direct vision,  positive ETCO2 and breath sounds checked- equal and bilateral Secured at: 22 cm Tube secured with: Tape Dental Injury: Teeth and Oropharynx as per pre-operative assessment

## 2019-05-26 NOTE — Anesthesia Preprocedure Evaluation (Signed)
Anesthesia Evaluation  Patient identified by MRN, date of birth, ID band Patient awake    Reviewed: Allergy & Precautions, NPO status , Patient's Chart, lab work & pertinent test results  Airway Mallampati: II  TM Distance: >3 FB Neck ROM: Full    Dental no notable dental hx. (+) Poor Dentition, Chipped, Missing   Pulmonary neg pulmonary ROS, Current SmokerPatient did not abstain from smoking.,    Pulmonary exam normal breath sounds clear to auscultation       Cardiovascular Exercise Tolerance: Good + CAD  Normal cardiovascular examI Rhythm:Regular Rate:Normal  Denies recent CP cathed in 2018-LAD 30 % blocked -no intervention at that time  Continues to smoke 1ppd   Neuro/Psych negative neurological ROS  negative psych ROS   GI/Hepatic negative GI ROS, Neg liver ROS,   Endo/Other  negative endocrine ROS  Renal/GU negative Renal ROS  negative genitourinary   Musculoskeletal  (+) Arthritis , Osteoarthritis,    Abdominal   Peds negative pediatric ROS (+)  Hematology negative hematology ROS (+)   Anesthesia Other Findings   Reproductive/Obstetrics negative OB ROS                             Anesthesia Physical Anesthesia Plan  ASA: II  Anesthesia Plan: General   Post-op Pain Management:    Induction: Intravenous  PONV Risk Score and Plan: 1 and Ondansetron and Treatment may vary due to age or medical condition  Airway Management Planned: Oral ETT  Additional Equipment:   Intra-op Plan:   Post-operative Plan: Extubation in OR  Informed Consent: I have reviewed the patients History and Physical, chart, labs and discussed the procedure including the risks, benefits and alternatives for the proposed anesthesia with the patient or authorized representative who has indicated his/her understanding and acceptance.     Dental advisory given  Plan Discussed with: CRNA  Anesthesia  Plan Comments: (Plan Full PPE use Plan GETA D/W PT -WTP with same after Q&A)        Anesthesia Quick Evaluation

## 2019-05-26 NOTE — Op Note (Signed)
05/26/2019  3:00 PM  PATIENT:  Jeremy Hancock  49 y.o. male  PRE-OPERATIVE DIAGNOSIS:  fracture right patella  POST-OPERATIVE DIAGNOSIS:  fracture right patella  PROCEDURE:  Procedure(s): OPEN REDUCTION INTERNAL (ORIF) FIXATION RIGHT PATELLA (Right)   Implants 18-gauge wire, 3, 0.62 K wires and a #2 Ethibond cerclage in a #5 Ethibond cerclage  SURGEON:  Surgeon(s) and Role:    Carole Civil, MD - Primary  PHYSICIAN ASSISTANT:   ASSISTANTS: none   ANESTHESIA:   general  EBL:  20 mL   BLOOD ADMINISTERED:none  DRAINS: none   LOCAL MEDICATIONS USED: Exparel 20 cc and 20 cc of Marcaine with epinephrine  SPECIMEN:  No Specimen  DISPOSITION OF SPECIMEN:  N/A  COUNTS:  YES  TOURNIQUET:   Total Tourniquet Time Documented: Thigh (Right) - 50 minutes Total: Thigh (Right) - 50 minutes   DICTATION: .Viviann Spare Dictation  PLAN OF CARE: Discharge to home after PACU  PATIENT DISPOSITION:  PACU - hemodynamically stable.   Delay start of Pharmacological VTE agent (>24hrs) due to surgical blood loss or risk of bleeding: not applicable  Details of procedure  Jeremy Hancock was identified in preop by appropriate markers and the right knee was confirmed as the surgical site after thorough chart review and speaking to the patient.  He was taken to the operating room given vancomycin due to his previous's MRSA infections  He was placed supine on the operating table and intubated.  After successful intubation his right leg was prepped and draped sterilely we placed a bump under his right hip.  The timeout was completed  We made a midline incision we divided the subcutaneous tissue to create full-thickness skin flaps.  We encountered the fracture it was a 3 fragment fracture with some other small articular pieces  The fracture was debrided the joint was inspected and irrigated.  From what we could see of the joint there was no cartilage damage.  However posteriorly the  patella had some cartilage loss.  The fracture was in a T shape with the top of the T being vertical along the lateral portion of the patella.  The transverse fracture split the rest of the patella and half  We placed a #2 Ethibond suture around the retinaculum and pulled together to help reduce the fracture we then reduced it and held it further with a pointed reduction clamp.  We then brought the T part of the fracture into position and held it with suture using #1 Vicryl suture.  There was also a delamination portion of the fracture from the inferior piece which was held with a K wire.  We then took an x-ray the x-rays show the fracture to be reduced the pins were in good position so we placed a number 18-gauge wire around the inferior portion of the pens and then through the quadriceps underneath the pins and then cut the pins bent them and buried them in the soft tissues  Final x-ray after the 18-gauge wire was placed show the fracture to be in good position and hardware in good position and we had a good overall reduction  Irrigation was performed and then interrupted #0 Monocryl was placed followed by staples.  Prior to closure we injected all the soft tissues with Marcaine and epinephrine as well as exparel  The limb was wrapped from the foot to the groin with an Ace bandage and then a straight knee immobilizer was placed  He was extubated and taken to recovery  room in stable condition

## 2019-05-26 NOTE — Brief Op Note (Signed)
05/26/2019  3:00 PM  PATIENT:  Jeremy Hancock  49 y.o. male  PRE-OPERATIVE DIAGNOSIS:  fracture right patella  POST-OPERATIVE DIAGNOSIS:  fracture right patella  PROCEDURE:  Procedure(s): OPEN REDUCTION INTERNAL (ORIF) FIXATION RIGHT PATELLA (Right)   Implants 18-gauge wire, 3, 0.62 K wires and a #2 Ethibond cerclage in a #5 Ethibond cerclage  SURGEON:  Surgeon(s) and Role:    * Jeremy Lybrand E, MD - Primary  PHYSICIAN ASSISTANT:   ASSISTANTS: none   ANESTHESIA:   general  EBL:  20 mL   BLOOD ADMINISTERED:none  DRAINS: none   LOCAL MEDICATIONS USED: Exparel 20 cc and 20 cc of Marcaine with epinephrine  SPECIMEN:  No Specimen  DISPOSITION OF SPECIMEN:  N/A  COUNTS:  YES  TOURNIQUET:   Total Tourniquet Time Documented: Thigh (Right) - 50 minutes Total: Thigh (Right) - 50 minutes   DICTATION: .Dragon Dictation  PLAN OF CARE: Discharge to home after PACU  PATIENT DISPOSITION:  PACU - hemodynamically stable.   Delay start of Pharmacological VTE agent (>24hrs) due to surgical blood loss or risk of bleeding: not applicable  Details of procedure  Jeremy Hancock was identified in preop by appropriate markers and the right knee was confirmed as the surgical site after thorough chart review and speaking to the patient.  He was taken to the operating room given vancomycin due to his previous's MRSA infections  He was placed supine on the operating table and intubated.  After successful intubation his right leg was prepped and draped sterilely we placed a bump under his right hip.  The timeout was completed  We made a midline incision we divided the subcutaneous tissue to create full-thickness skin flaps.  We encountered the fracture it was a 3 fragment fracture with some other small articular pieces  The fracture was debrided the joint was inspected and irrigated.  From what we could see of the joint there was no cartilage damage.  However posteriorly the  patella had some cartilage loss.  The fracture was in a T shape with the top of the T being vertical along the lateral portion of the patella.  The transverse fracture split the rest of the patella and half  We placed a #2 Ethibond suture around the retinaculum and pulled together to help reduce the fracture we then reduced it and held it further with a pointed reduction clamp.  We then brought the T part of the fracture into position and held it with suture using #1 Vicryl suture.  There was also a delamination portion of the fracture from the inferior piece which was held with a K wire.  We then took an x-ray the x-rays show the fracture to be reduced the pins were in good position so we placed a number 18-gauge wire around the inferior portion of the pens and then through the quadriceps underneath the pins and then cut the pins bent them and buried them in the soft tissues  Final x-ray after the 18-gauge wire was placed show the fracture to be in good position and hardware in good position and we had a good overall reduction  Irrigation was performed and then interrupted #0 Monocryl was placed followed by staples.  Prior to closure we injected all the soft tissues with Marcaine and epinephrine as well as exparel  The limb was wrapped from the foot to the groin with an Ace bandage and then a straight knee immobilizer was placed  He was extubated and taken to recovery   room in stable condition  
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
## 2019-05-27 ENCOUNTER — Telehealth: Payer: Self-pay | Admitting: Orthopedic Surgery

## 2019-05-27 NOTE — Telephone Encounter (Signed)
Patient called this morning stating his knee is killing him.  He had ORIF of the patella yesterday.  He said he didn't sleep last night.  He is taking pain medication.  I asked him if he was elevating leg and he said he wasn't.  As instructed by my office supervisor, I told him that he needs to do so and to try to ice it. He said he would do this.  He said he did loosen the ace wrap and that made it feel a lot better.  I told him that Dr. Romeo Apple was in surgery this morning but that I would get a message to him.  Would you please advise on what patient should do next?  Thanks

## 2019-05-27 NOTE — Telephone Encounter (Signed)
I called patient, NA, left message for him to call us if still needed.

## 2019-05-28 NOTE — Telephone Encounter (Signed)
Sending to you in case patient calls today.  Otherwise no action, thanks-

## 2019-05-28 NOTE — Telephone Encounter (Signed)
Called again yesterday late afternoon, NA.

## 2019-06-04 ENCOUNTER — Encounter: Payer: Self-pay | Admitting: Orthopedic Surgery

## 2019-06-04 ENCOUNTER — Other Ambulatory Visit: Payer: Self-pay

## 2019-06-04 ENCOUNTER — Ambulatory Visit (INDEPENDENT_AMBULATORY_CARE_PROVIDER_SITE_OTHER): Payer: Medicare HMO | Admitting: Orthopedic Surgery

## 2019-06-04 DIAGNOSIS — Z8781 Personal history of (healed) traumatic fracture: Secondary | ICD-10-CM

## 2019-06-04 DIAGNOSIS — S82041D Displaced comminuted fracture of right patella, subsequent encounter for closed fracture with routine healing: Secondary | ICD-10-CM | POA: Diagnosis not present

## 2019-06-04 DIAGNOSIS — Z9889 Other specified postprocedural states: Secondary | ICD-10-CM

## 2019-06-04 MED ORDER — IBUPROFEN 800 MG PO TABS: 800.0000 mg | ORAL_TABLET | Freq: Three times a day (TID) | ORAL | 1 refills | Status: DC | PRN

## 2019-06-04 MED ORDER — OXYCODONE-ACETAMINOPHEN 5-325 MG PO TABS: 1.0000 | ORAL_TABLET | ORAL | 0 refills | Status: DC | PRN

## 2019-06-04 NOTE — Progress Notes (Signed)
Chief Complaint  Patient presents with  . Post-op Follow-up    05/26/19 right patella ORIF has pain/ not wearing brace or bandage today   The patient came in without his brace today he has been walking without it I encouraged him not to do that and warned him that his fracture fixation can come apart  He was placed in a playmaker brace 0 to 90 degrees full weightbearing with a 5-day follow-up to have his staples taken out  Encounter Diagnoses  Name Primary?  . Closed displaced comminuted fracture of right patella with routine healing, subsequent encounter2/21/21   . S/P ORIF (open reduction internal fixation) fracture right patella 05/26/19  Yes    Meds ordered this encounter  Medications  . oxyCODONE-acetaminophen (PERCOCET) 5-325 MG tablet    Sig: Take 1 tablet by mouth every 4 (four) hours as needed for up to 7 days for severe pain.    Dispense:  42 tablet    Refill:  0  . ibuprofen (ADVIL) 800 MG tablet    Sig: Take 1 tablet (800 mg total) by mouth every 8 (eight) hours as needed.    Dispense:  90 tablet    Refill:  1

## 2019-06-07 DIAGNOSIS — Z8781 Personal history of (healed) traumatic fracture: Secondary | ICD-10-CM | POA: Insufficient documentation

## 2019-06-09 ENCOUNTER — Ambulatory Visit (INDEPENDENT_AMBULATORY_CARE_PROVIDER_SITE_OTHER): Payer: Medicare HMO | Admitting: Orthopedic Surgery

## 2019-06-09 ENCOUNTER — Other Ambulatory Visit: Payer: Self-pay

## 2019-06-09 ENCOUNTER — Ambulatory Visit: Payer: Medicare HMO

## 2019-06-09 ENCOUNTER — Encounter: Payer: Self-pay | Admitting: Orthopedic Surgery

## 2019-06-09 DIAGNOSIS — Z4889 Encounter for other specified surgical aftercare: Secondary | ICD-10-CM

## 2019-06-09 DIAGNOSIS — M62838 Other muscle spasm: Secondary | ICD-10-CM

## 2019-06-09 DIAGNOSIS — Z8781 Personal history of (healed) traumatic fracture: Secondary | ICD-10-CM

## 2019-06-09 DIAGNOSIS — S82041D Displaced comminuted fracture of right patella, subsequent encounter for closed fracture with routine healing: Secondary | ICD-10-CM

## 2019-06-09 DIAGNOSIS — Z9889 Other specified postprocedural states: Secondary | ICD-10-CM

## 2019-06-09 MED ORDER — OXYCODONE-ACETAMINOPHEN 5-325 MG PO TABS: 1.0000 | ORAL_TABLET | ORAL | 0 refills | Status: AC | PRN

## 2019-06-09 MED ORDER — TIZANIDINE HCL 4 MG PO TABS: 4.0000 mg | ORAL_TABLET | Freq: Every day | ORAL | 1 refills | Status: DC

## 2019-06-09 NOTE — Progress Notes (Signed)
po

## 2019-06-09 NOTE — Progress Notes (Signed)
Chief Complaint  Patient presents with  . Post-op Follow-up    05/26/19 right patella fracture 2/24/1    Encounter Diagnoses  Name Primary?  . S/P ORIF (open reduction internal fixation) fracture right patella 05/26/19  Yes  . Closed displaced comminuted fracture of right patella with routine healing, subsequent encounter2/21/21   . Aftercare following surgery   . Muscle spasm of right lower extremity    Meds ordered this encounter  Medications  . tiZANidine (ZANAFLEX) 4 MG tablet    Sig: Take 1 tablet (4 mg total) by mouth daily.    Dispense:  30 tablet    Refill:  1  . oxyCODONE-acetaminophen (PERCOCET) 5-325 MG tablet    Sig: Take 1 tablet by mouth every 4 (four) hours as needed for up to 7 days for severe pain.    Dispense:  42 tablet    Refill:  0    [name] complains of muscle spasms and he had to take more Percocet at night to get to sleep otherwise doing well.  He is ambulatory with a hinged playmaker brace from 0 to 90 degrees  His staples have been removed postop day 14  He will continue 0 to 90 degrees up to 6 weeks  At 6 weeks x-rays will be obtained  Postop muscle spasms treated with tizanidine pain medication refilled see allergies

## 2019-06-21 ENCOUNTER — Other Ambulatory Visit: Payer: Self-pay | Admitting: Orthopedic Surgery

## 2019-06-21 MED ORDER — OXYCODONE-ACETAMINOPHEN 5-325 MG PO TABS: 1.0000 | ORAL_TABLET | Freq: Four times a day (QID) | ORAL | 0 refills | Status: DC | PRN

## 2019-06-21 NOTE — Telephone Encounter (Signed)
Patient requests:  oxyCODONE-acetaminophen (PERCOCET) 5-325 MG tablet 42 tablet  -[facility], [address], [place]   - states ibuprofen does not help much

## 2019-07-07 ENCOUNTER — Encounter: Payer: Self-pay | Admitting: Orthopedic Surgery

## 2019-07-07 ENCOUNTER — Other Ambulatory Visit: Payer: Self-pay

## 2019-07-07 ENCOUNTER — Ambulatory Visit (INDEPENDENT_AMBULATORY_CARE_PROVIDER_SITE_OTHER): Payer: Medicare HMO | Admitting: Orthopedic Surgery

## 2019-07-07 ENCOUNTER — Ambulatory Visit: Payer: Medicare HMO

## 2019-07-07 DIAGNOSIS — S82041D Displaced comminuted fracture of right patella, subsequent encounter for closed fracture with routine healing: Secondary | ICD-10-CM

## 2019-07-07 DIAGNOSIS — Z8781 Personal history of (healed) traumatic fracture: Secondary | ICD-10-CM

## 2019-07-07 DIAGNOSIS — Z9889 Other specified postprocedural states: Secondary | ICD-10-CM

## 2019-07-07 MED ORDER — OXYCODONE-ACETAMINOPHEN 5-325 MG PO TABS: 1.0000 | ORAL_TABLET | Freq: Four times a day (QID) | ORAL | 0 refills | Status: DC | PRN

## 2019-07-07 NOTE — Progress Notes (Signed)
Chief Complaint  Patient presents with  . Routine Post Op    05/26/19 right patella fracture   . Medication Refill    Oxycodone    Encounter Diagnoses  Name Primary?  . S/P ORIF (open reduction internal fixation) fracture right patella 05/26/19  Yes  . Closed displaced comminuted fracture of right patella with routine healing, subsequent encounter2/21/21     Postop ORIF right patella currently on Percocet for pain here for x-ray at 6 weeks  Range of motion 0-90  X-ray shows fracture fixation is intact  Advance range of motion brace to full  Come back 6 weeks for another x-ray  Meds ordered this encounter  Medications  . oxyCODONE-acetaminophen (PERCOCET/ROXICET) 5-325 MG tablet    Sig: Take 1 tablet by mouth every 6 (six) hours as needed for severe pain.    Dispense:  42 tablet    Refill:  0

## 2019-07-07 NOTE — Patient Instructions (Signed)
Wear brace when walking  

## 2019-07-26 ENCOUNTER — Telehealth: Payer: Self-pay

## 2019-07-26 ENCOUNTER — Other Ambulatory Visit: Payer: Self-pay

## 2019-07-26 MED ORDER — OXYCODONE-ACETAMINOPHEN 5-325 MG PO TABS: 1.0000 | ORAL_TABLET | Freq: Four times a day (QID) | ORAL | 0 refills | Status: DC | PRN

## 2019-07-26 NOTE — Telephone Encounter (Signed)
Patient states he is having a lot of pain and it feels like he has knots on his kneecap. He has been using ice but it only numbs it for a while. He is wondering if he should come in earlier than his 08/18/19 appointment. He has asked if you would give him a return call at your convenience.

## 2019-07-26 NOTE — Telephone Encounter (Signed)
Oxycodone-Acetaminophen 5/325 mg  Qty 42 Tablets  Take 1 tablet by mouth every 6 (six) hours as needed for severe pain.       PATIENT USES [facility] ON [address]

## 2019-08-18 ENCOUNTER — Ambulatory Visit (INDEPENDENT_AMBULATORY_CARE_PROVIDER_SITE_OTHER): Payer: Medicare HMO | Admitting: Orthopedic Surgery

## 2019-08-18 ENCOUNTER — Other Ambulatory Visit: Payer: Self-pay

## 2019-08-18 ENCOUNTER — Encounter: Payer: Self-pay | Admitting: Orthopedic Surgery

## 2019-08-18 ENCOUNTER — Ambulatory Visit: Payer: Medicare HMO

## 2019-08-18 DIAGNOSIS — Z9889 Other specified postprocedural states: Secondary | ICD-10-CM

## 2019-08-18 DIAGNOSIS — S82041D Displaced comminuted fracture of right patella, subsequent encounter for closed fracture with routine healing: Secondary | ICD-10-CM

## 2019-08-18 DIAGNOSIS — Z8781 Personal history of (healed) traumatic fracture: Secondary | ICD-10-CM

## 2019-08-18 MED ORDER — OXYCODONE-ACETAMINOPHEN 5-325 MG PO TABS: 1.0000 | ORAL_TABLET | Freq: Three times a day (TID) | ORAL | 0 refills | Status: AC | PRN

## 2019-08-18 MED ORDER — TIZANIDINE HCL 4 MG PO TABS: 4.0000 mg | ORAL_TABLET | Freq: Three times a day (TID) | ORAL | 1 refills | Status: AC | PRN

## 2019-08-18 NOTE — Progress Notes (Signed)
Chief Complaint  Patient presents with  . Routine Post Op    05/26/19 right patella fracture/ states painful     Encounter Diagnoses  Name Primary?  . S/P ORIF (open reduction internal fixation) fracture right patella 05/26/19  Yes  . Closed displaced comminuted fracture of right patella with routine healing, subsequent encounter     49 year old male 12 weeks after ORIF tension band right knee for patella fracture.  Complains of muscle spasms and pain and stiffness  X-ray shows the fracture is healed he has 95 degrees of motion  He is encouraged to increase his range of motion.  He was advised that we have to stop any kind of opioids we can refill his tizanidine and make it 3 times a day  Follow-up in 3 months  Meds ordered this encounter  Medications  . tiZANidine (ZANAFLEX) 4 MG tablet    Sig: Take 1 tablet (4 mg total) by mouth every 8 (eight) hours as needed for muscle spasms.    Dispense:  30 tablet    Refill:  1  . oxyCODONE-acetaminophen (PERCOCET/ROXICET) 5-325 MG tablet    Sig: Take 1 tablet by mouth every 8 (eight) hours as needed for up to 7 days for severe pain.    Dispense:  21 tablet    Refill:  0

## 2019-10-14 DIAGNOSIS — T1501XA Foreign body in cornea, right eye, initial encounter: Secondary | ICD-10-CM | POA: Diagnosis not present

## 2019-11-22 ENCOUNTER — Encounter: Payer: Self-pay | Admitting: Orthopedic Surgery

## 2019-11-22 ENCOUNTER — Ambulatory Visit: Payer: Medicare HMO | Admitting: Orthopedic Surgery

## 2019-11-23 ENCOUNTER — Telehealth: Payer: Self-pay | Admitting: Orthopedic Surgery

## 2019-11-23 NOTE — Telephone Encounter (Signed)
Patient called regarding his appointment with Dr Romeo Apple which was scheduled for same day, 11/22/19 - said he missed, however, said he is much better and does not need to reschedule. States "it was going to be last visit here".  Letter had been sent - requests we not mail letter; letter pulled, not sent per patient request.

## 2020-02-21 DIAGNOSIS — S9031XA Contusion of right foot, initial encounter: Secondary | ICD-10-CM | POA: Diagnosis not present

## 2020-02-21 DIAGNOSIS — M722 Plantar fascial fibromatosis: Secondary | ICD-10-CM | POA: Diagnosis not present

## 2020-02-21 DIAGNOSIS — M79673 Pain in unspecified foot: Secondary | ICD-10-CM | POA: Diagnosis not present

## 2020-03-16 ENCOUNTER — Other Ambulatory Visit: Payer: Self-pay | Admitting: Podiatry

## 2020-03-16 ENCOUNTER — Other Ambulatory Visit (HOSPITAL_COMMUNITY): Payer: Self-pay | Admitting: Physician Assistant

## 2020-03-16 ENCOUNTER — Other Ambulatory Visit: Payer: Self-pay | Admitting: Physician Assistant

## 2020-03-16 DIAGNOSIS — S9031XA Contusion of right foot, initial encounter: Secondary | ICD-10-CM | POA: Diagnosis not present

## 2020-03-16 DIAGNOSIS — M722 Plantar fascial fibromatosis: Secondary | ICD-10-CM

## 2020-03-16 DIAGNOSIS — M79673 Pain in unspecified foot: Secondary | ICD-10-CM | POA: Diagnosis not present

## 2020-03-16 DIAGNOSIS — M792 Neuralgia and neuritis, unspecified: Secondary | ICD-10-CM | POA: Diagnosis not present

## 2020-03-29 ENCOUNTER — Ambulatory Visit (HOSPITAL_COMMUNITY)
Admission: RE | Admit: 2020-03-29 | Discharge: 2020-03-29 | Disposition: A | Discharge status: Home / Self Care | Payer: Medicare HMO | Source: Ambulatory Visit | Attending: Podiatry | Admitting: Podiatry

## 2020-03-29 ENCOUNTER — Other Ambulatory Visit: Payer: Self-pay

## 2020-03-29 DIAGNOSIS — M722 Plantar fascial fibromatosis: Secondary | ICD-10-CM | POA: Insufficient documentation

## 2020-03-29 DIAGNOSIS — R600 Localized edema: Secondary | ICD-10-CM | POA: Diagnosis not present

## 2020-03-29 DIAGNOSIS — M19071 Primary osteoarthritis, right ankle and foot: Secondary | ICD-10-CM | POA: Diagnosis not present

## 2020-03-29 MED ORDER — GADOBUTROL 1 MMOL/ML IV SOLN
7.0000 mL | Freq: Once | INTRAVENOUS | Status: AC | PRN
  Administered 2020-03-29: 7 mL via INTRAVENOUS

## 2020-03-30 DIAGNOSIS — M79673 Pain in unspecified foot: Secondary | ICD-10-CM | POA: Diagnosis not present

## 2020-03-30 DIAGNOSIS — S9031XA Contusion of right foot, initial encounter: Secondary | ICD-10-CM | POA: Diagnosis not present

## 2020-03-30 DIAGNOSIS — M85679 Other cyst of bone, unspecified ankle and foot: Secondary | ICD-10-CM | POA: Diagnosis not present

## 2020-04-12 DIAGNOSIS — M25571 Pain in right ankle and joints of right foot: Secondary | ICD-10-CM | POA: Diagnosis not present

## 2020-04-12 DIAGNOSIS — G7249 Other inflammatory and immune myopathies, not elsewhere classified: Secondary | ICD-10-CM | POA: Diagnosis not present

## 2020-04-12 DIAGNOSIS — M86671 Other chronic osteomyelitis, right ankle and foot: Secondary | ICD-10-CM | POA: Diagnosis not present

## 2020-04-12 DIAGNOSIS — R69 Illness, unspecified: Secondary | ICD-10-CM | POA: Diagnosis not present

## 2020-04-12 DIAGNOSIS — M79671 Pain in right foot: Secondary | ICD-10-CM | POA: Diagnosis not present

## 2020-04-13 ENCOUNTER — Encounter (HOSPITAL_COMMUNITY): Payer: Self-pay | Admitting: Emergency Medicine

## 2020-04-13 ENCOUNTER — Inpatient Hospital Stay (HOSPITAL_COMMUNITY)
Admission: EM | Admit: 2020-04-13 | Discharge: 2020-04-19 | DRG: 504 | Disposition: A | Discharge status: Home Health | Payer: Medicare HMO | Attending: Orthopedic Surgery | Admitting: Orthopedic Surgery

## 2020-04-13 ENCOUNTER — Other Ambulatory Visit (HOSPITAL_COMMUNITY): Payer: Self-pay | Admitting: Orthopedic Surgery

## 2020-04-13 DIAGNOSIS — B9562 Methicillin resistant Staphylococcus aureus infection as the cause of diseases classified elsewhere: Secondary | ICD-10-CM | POA: Diagnosis not present

## 2020-04-13 DIAGNOSIS — S91031S Puncture wound without foreign body, right ankle, sequela: Secondary | ICD-10-CM

## 2020-04-13 DIAGNOSIS — F17210 Nicotine dependence, cigarettes, uncomplicated: Secondary | ICD-10-CM | POA: Diagnosis present

## 2020-04-13 DIAGNOSIS — M86671 Other chronic osteomyelitis, right ankle and foot: Secondary | ICD-10-CM | POA: Diagnosis present

## 2020-04-13 DIAGNOSIS — E785 Hyperlipidemia, unspecified: Secondary | ICD-10-CM | POA: Diagnosis not present

## 2020-04-13 DIAGNOSIS — R69 Illness, unspecified: Secondary | ICD-10-CM | POA: Diagnosis not present

## 2020-04-13 DIAGNOSIS — M869 Osteomyelitis, unspecified: Secondary | ICD-10-CM | POA: Diagnosis not present

## 2020-04-13 DIAGNOSIS — Z20822 Contact with and (suspected) exposure to covid-19: Secondary | ICD-10-CM | POA: Diagnosis not present

## 2020-04-13 DIAGNOSIS — A4902 Methicillin resistant Staphylococcus aureus infection, unspecified site: Secondary | ICD-10-CM | POA: Diagnosis not present

## 2020-04-13 DIAGNOSIS — W3400XS Accidental discharge from unspecified firearms or gun, sequela: Secondary | ICD-10-CM

## 2020-04-13 DIAGNOSIS — L02415 Cutaneous abscess of right lower limb: Secondary | ICD-10-CM | POA: Diagnosis not present

## 2020-04-13 DIAGNOSIS — L02611 Cutaneous abscess of right foot: Secondary | ICD-10-CM | POA: Diagnosis not present

## 2020-04-13 DIAGNOSIS — I2510 Atherosclerotic heart disease of native coronary artery without angina pectoris: Secondary | ICD-10-CM | POA: Diagnosis not present

## 2020-04-13 DIAGNOSIS — M868X7 Other osteomyelitis, ankle and foot: Principal | ICD-10-CM | POA: Diagnosis present

## 2020-04-13 DIAGNOSIS — M86171 Other acute osteomyelitis, right ankle and foot: Secondary | ICD-10-CM | POA: Diagnosis not present

## 2020-04-13 DIAGNOSIS — E7800 Pure hypercholesterolemia, unspecified: Secondary | ICD-10-CM | POA: Diagnosis not present

## 2020-04-13 DIAGNOSIS — E1169 Type 2 diabetes mellitus with other specified complication: Secondary | ICD-10-CM | POA: Diagnosis not present

## 2020-04-13 DIAGNOSIS — M1990 Unspecified osteoarthritis, unspecified site: Secondary | ICD-10-CM | POA: Diagnosis not present

## 2020-04-13 DIAGNOSIS — M5116 Intervertebral disc disorders with radiculopathy, lumbar region: Secondary | ICD-10-CM | POA: Diagnosis not present

## 2020-04-13 LAB — CBC WITH DIFFERENTIAL/PLATELET
Abs Immature Granulocytes: 0.09 10*3/uL — ABNORMAL HIGH (ref 0.00–0.07)
Basophils Absolute: 0.1 10*3/uL (ref 0.0–0.1)
Basophils Relative: 0 %
Eosinophils Absolute: 0.2 10*3/uL (ref 0.0–0.5)
Eosinophils Relative: 1 %
HCT: 44.4 % (ref 39.0–52.0)
Hemoglobin: 14.1 g/dL (ref 13.0–17.0)
Immature Granulocytes: 1 %
Lymphocytes Relative: 10 %
Lymphs Abs: 1.8 10*3/uL (ref 0.7–4.0)
MCH: 29 pg (ref 26.0–34.0)
MCHC: 31.8 g/dL (ref 30.0–36.0)
MCV: 91.4 fL (ref 80.0–100.0)
Monocytes Absolute: 1.2 10*3/uL — ABNORMAL HIGH (ref 0.1–1.0)
Monocytes Relative: 7 %
Neutro Abs: 14.3 10*3/uL — ABNORMAL HIGH (ref 1.7–7.7)
Neutrophils Relative %: 81 %
Platelets: 442 10*3/uL — ABNORMAL HIGH (ref 150–400)
RBC: 4.86 MIL/uL (ref 4.22–5.81)
RDW: 13.2 % (ref 11.5–15.5)
WBC: 17.6 10*3/uL — ABNORMAL HIGH (ref 4.0–10.5)
nRBC: 0 % (ref 0.0–0.2)

## 2020-04-13 LAB — COMPREHENSIVE METABOLIC PANEL
ALT: 37 U/L (ref 0–44)
AST: 31 U/L (ref 15–41)
Albumin: 3.1 g/dL — ABNORMAL LOW (ref 3.5–5.0)
Alkaline Phosphatase: 87 U/L (ref 38–126)
Anion gap: 12 (ref 5–15)
BUN: 7 mg/dL (ref 6–20)
CO2: 28 mmol/L (ref 22–32)
Calcium: 8.9 mg/dL (ref 8.9–10.3)
Chloride: 99 mmol/L (ref 98–111)
Creatinine, Ser: 0.86 mg/dL (ref 0.61–1.24)
GFR, Estimated: >60 mL/min (ref >60)
Glucose, Bld: 103 mg/dL — ABNORMAL HIGH (ref 70–99)
Potassium: 3.6 mmol/L (ref 3.5–5.1)
Sodium: 139 mmol/L (ref 135–145)
Total Bilirubin: 0.6 mg/dL (ref 0.3–1.2)
Total Protein: 7.6 g/dL (ref 6.5–8.1)

## 2020-04-13 LAB — LACTIC ACID, PLASMA: Lactic Acid, Venous: 1.5 mmol/L (ref 0.5–1.9)

## 2020-04-13 MED ORDER — CELECOXIB 200 MG PO CAPS
200.0000 mg | ORAL_CAPSULE | Freq: Two times a day (BID) | ORAL | Status: DC
  Administered 2020-04-14: 200 mg via ORAL
  Filled 2020-04-13: qty 1

## 2020-04-13 MED ORDER — HYDROMORPHONE HCL 1 MG/ML IJ SOLN
1.0000 mg | Freq: Once | INTRAMUSCULAR | Status: AC
  Administered 2020-04-13: 1 mg via INTRAVENOUS
  Filled 2020-04-13: qty 1

## 2020-04-13 MED ORDER — OXYCODONE HCL 5 MG PO TABS
5.0000 mg | ORAL_TABLET | ORAL | Status: DC | PRN
  Administered 2020-04-14: 5 mg via ORAL
  Filled 2020-04-13: qty 1

## 2020-04-13 MED ORDER — ONDANSETRON HCL 4 MG/2ML IJ SOLN: 4.0000 mg | Freq: Four times a day (QID) | INTRAMUSCULAR | Status: DC | PRN

## 2020-04-13 MED ORDER — BISACODYL 10 MG RE SUPP: 10.0000 mg | Freq: Every day | RECTAL | Status: DC | PRN

## 2020-04-13 MED ORDER — CHLORHEXIDINE GLUCONATE 4 % EX LIQD
60.0000 mL | Freq: Once | CUTANEOUS | Status: DC
  Filled 2020-04-13: qty 60

## 2020-04-13 MED ORDER — HYDROMORPHONE HCL 1 MG/ML IJ SOLN
0.5000 mg | INTRAMUSCULAR | Status: DC | PRN
  Administered 2020-04-14 (×2): 0.5 mg via INTRAVENOUS
  Filled 2020-04-13 (×2): qty 1

## 2020-04-13 MED ORDER — MAGNESIUM CITRATE PO SOLN: 1.0000 | Freq: Once | ORAL | Status: DC | PRN

## 2020-04-13 MED ORDER — KCL IN DEXTROSE-NACL 20-5-0.45 MEQ/L-%-% IV SOLN
INTRAVENOUS | Status: DC
  Filled 2020-04-13: qty 1000

## 2020-04-13 MED ORDER — ACETAMINOPHEN 500 MG PO TABS: 1000.0000 mg | ORAL_TABLET | Freq: Three times a day (TID) | ORAL | Status: DC | PRN

## 2020-04-13 MED ORDER — POVIDONE-IODINE 10 % EX SWAB: 2.0000 "application " | Freq: Once | CUTANEOUS | Status: DC

## 2020-04-13 MED ORDER — DOCUSATE SODIUM 100 MG PO CAPS
100.0000 mg | ORAL_CAPSULE | Freq: Two times a day (BID) | ORAL | Status: DC
  Administered 2020-04-14: 100 mg via ORAL
  Filled 2020-04-13: qty 1

## 2020-04-13 MED ORDER — SENNA 8.6 MG PO TABS
1.0000 | ORAL_TABLET | Freq: Two times a day (BID) | ORAL | Status: DC
  Administered 2020-04-14: 8.6 mg via ORAL
  Filled 2020-04-13: qty 1

## 2020-04-13 MED ORDER — OXYCODONE HCL 5 MG PO TABS: 10.0000 mg | ORAL_TABLET | ORAL | Status: DC | PRN

## 2020-04-13 MED ORDER — ONDANSETRON HCL 4 MG PO TABS: 4.0000 mg | ORAL_TABLET | Freq: Four times a day (QID) | ORAL | Status: DC | PRN

## 2020-04-13 MED ORDER — POLYETHYLENE GLYCOL 3350 17 G PO PACK
17.0000 g | PACK | Freq: Every day | ORAL | Status: DC | PRN
Start: 2020-04-13 — End: 2020-04-14

## 2020-04-13 NOTE — ED Triage Notes (Signed)
Patient sent by PCP for evaluation of right ankle. Patient states he was schedule to have a procedure to clean out right ankle on next Tuesday but pain has become too severe to wait.
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
## 2020-04-14 ENCOUNTER — Encounter (HOSPITAL_COMMUNITY)
Admission: EM | Disposition: A | Discharge status: Home Health | Payer: Self-pay | Source: Home / Self Care | Attending: Orthopedic Surgery

## 2020-04-14 ENCOUNTER — Other Ambulatory Visit: Payer: Self-pay

## 2020-04-14 ENCOUNTER — Inpatient Hospital Stay (HOSPITAL_COMMUNITY): Payer: Medicare HMO | Admitting: Certified Registered Nurse Anesthetist

## 2020-04-14 ENCOUNTER — Encounter (HOSPITAL_COMMUNITY): Payer: Self-pay | Admitting: Orthopedic Surgery

## 2020-04-14 ENCOUNTER — Inpatient Hospital Stay (HOSPITAL_COMMUNITY): Admission: RE | Admit: 2020-04-14 | Payer: Medicare HMO | Source: Home / Self Care | Admitting: Orthopedic Surgery

## 2020-04-14 DIAGNOSIS — M86671 Other chronic osteomyelitis, right ankle and foot: Secondary | ICD-10-CM | POA: Diagnosis not present

## 2020-04-14 HISTORY — PX: INCISION AND DRAINAGE OF WOUND: SHX1803

## 2020-04-14 LAB — RESP PANEL BY RT-PCR (FLU A&B, COVID) ARPGX2
Influenza A by PCR: NEGATIVE
Influenza B by PCR: NEGATIVE
SARS Coronavirus 2 by RT PCR: NEGATIVE

## 2020-04-14 LAB — LACTIC ACID, PLASMA: Lactic Acid, Venous: 1.1 mmol/L (ref 0.5–1.9)

## 2020-04-14 LAB — C-REACTIVE PROTEIN: CRP: 21 mg/dL — ABNORMAL HIGH (ref <1.0)

## 2020-04-14 LAB — MRSA PCR SCREENING: MRSA by PCR: NEGATIVE

## 2020-04-14 LAB — HIV ANTIBODY (ROUTINE TESTING W REFLEX): HIV Screen 4th Generation wRfx: NONREACTIVE

## 2020-04-14 LAB — SEDIMENTATION RATE: Sed Rate: 71 mm/hr — ABNORMAL HIGH (ref 0–16)

## 2020-04-14 SURGERY — IRRIGATION AND DEBRIDEMENT WOUND
Anesthesia: Monitor Anesthesia Care | Laterality: Right

## 2020-04-14 MED ORDER — ROCURONIUM BROMIDE 10 MG/ML (PF) SYRINGE
PREFILLED_SYRINGE | INTRAVENOUS | Status: AC
  Filled 2020-04-14: qty 10

## 2020-04-14 MED ORDER — VANCOMYCIN HCL IN DEXTROSE 1-5 GM/200ML-% IV SOLN
INTRAVENOUS | Status: AC
  Filled 2020-04-14: qty 200

## 2020-04-14 MED ORDER — VANCOMYCIN HCL 500 MG IV SOLR
INTRAVENOUS | Status: DC | PRN
  Administered 2020-04-14: 500 mg

## 2020-04-14 MED ORDER — CHLORHEXIDINE GLUCONATE 0.12 % MT SOLN: 15.0000 mL | Freq: Once | OROMUCOSAL | Status: DC

## 2020-04-14 MED ORDER — DEXAMETHASONE SODIUM PHOSPHATE 10 MG/ML IJ SOLN
INTRAMUSCULAR | Status: DC | PRN
  Administered 2020-04-14: 10 mg

## 2020-04-14 MED ORDER — VANCOMYCIN HCL 1500 MG/300ML IV SOLN
1500.0000 mg | Freq: Once | INTRAVENOUS | Status: AC
  Administered 2020-04-14: 1500 mg via INTRAVENOUS
  Filled 2020-04-14: qty 300

## 2020-04-14 MED ORDER — PROPOFOL 500 MG/50ML IV EMUL
INTRAVENOUS | Status: DC | PRN
  Administered 2020-04-14: 50 ug/kg/min via INTRAVENOUS

## 2020-04-14 MED ORDER — ONDANSETRON HCL 4 MG/2ML IJ SOLN
INTRAMUSCULAR | Status: DC | PRN
  Administered 2020-04-14: 4 mg via INTRAVENOUS

## 2020-04-14 MED ORDER — DEXAMETHASONE SODIUM PHOSPHATE 10 MG/ML IJ SOLN
INTRAMUSCULAR | Status: AC
  Filled 2020-04-14: qty 1

## 2020-04-14 MED ORDER — ADULT MULTIVITAMIN W/MINERALS CH
1.0000 | ORAL_TABLET | Freq: Every day | ORAL | Status: DC
  Administered 2020-04-14 – 2020-04-19 (×6): 1 via ORAL
  Filled 2020-04-14 (×7): qty 1

## 2020-04-14 MED ORDER — OXYCODONE HCL 5 MG PO TABS: 5.0000 mg | ORAL_TABLET | Freq: Once | ORAL | Status: DC | PRN

## 2020-04-14 MED ORDER — FENTANYL CITRATE (PF) 100 MCG/2ML IJ SOLN
100.0000 ug | Freq: Once | INTRAMUSCULAR | Status: AC
  Administered 2020-04-14: 100 ug via INTRAVENOUS

## 2020-04-14 MED ORDER — FENTANYL CITRATE (PF) 250 MCG/5ML IJ SOLN
INTRAMUSCULAR | Status: AC
  Filled 2020-04-14: qty 5

## 2020-04-14 MED ORDER — SENNA 8.6 MG PO TABS
1.0000 | ORAL_TABLET | Freq: Two times a day (BID) | ORAL | Status: DC
  Administered 2020-04-14 – 2020-04-19 (×7): 8.6 mg via ORAL
  Filled 2020-04-14 (×9): qty 1

## 2020-04-14 MED ORDER — CEFAZOLIN SODIUM-DEXTROSE 2-3 GM-%(50ML) IV SOLR
INTRAVENOUS | Status: DC | PRN
  Administered 2020-04-14: 2 g via INTRAVENOUS

## 2020-04-14 MED ORDER — PROPOFOL 10 MG/ML IV BOLUS
INTRAVENOUS | Status: AC
  Filled 2020-04-14: qty 40

## 2020-04-14 MED ORDER — ONDANSETRON HCL 4 MG/2ML IJ SOLN: 4.0000 mg | Freq: Once | INTRAMUSCULAR | Status: DC | PRN

## 2020-04-14 MED ORDER — PHENYLEPHRINE HCL-NACL 10-0.9 MG/250ML-% IV SOLN
INTRAVENOUS | Status: DC | PRN
  Administered 2020-04-14: 25 ug/min via INTRAVENOUS

## 2020-04-14 MED ORDER — LACTATED RINGERS IV SOLN: INTRAVENOUS | Status: DC

## 2020-04-14 MED ORDER — LIDOCAINE 2% (20 MG/ML) 5 ML SYRINGE
INTRAMUSCULAR | Status: AC
  Filled 2020-04-14: qty 5

## 2020-04-14 MED ORDER — SODIUM CHLORIDE 0.9 % IV SOLN
2.0000 g | Freq: Three times a day (TID) | INTRAVENOUS | Status: DC
  Administered 2020-04-14 – 2020-04-15 (×3): 2 g via INTRAVENOUS
  Filled 2020-04-14 (×3): qty 2

## 2020-04-14 MED ORDER — ACETAMINOPHEN 500 MG PO TABS: 1000.0000 mg | ORAL_TABLET | Freq: Three times a day (TID) | ORAL | Status: DC | PRN

## 2020-04-14 MED ORDER — GENTAMICIN SULFATE 40 MG/ML IJ SOLN
INTRAMUSCULAR | Status: DC | PRN
  Administered 2020-04-14: 160 mg

## 2020-04-14 MED ORDER — FENTANYL CITRATE (PF) 100 MCG/2ML IJ SOLN
INTRAMUSCULAR | Status: AC
  Filled 2020-04-14: qty 2

## 2020-04-14 MED ORDER — MIDAZOLAM HCL 2 MG/2ML IJ SOLN
INTRAMUSCULAR | Status: AC
  Filled 2020-04-14: qty 2

## 2020-04-14 MED ORDER — BISACODYL 10 MG RE SUPP: 10.0000 mg | Freq: Every day | RECTAL | Status: DC | PRN

## 2020-04-14 MED ORDER — POLYETHYLENE GLYCOL 3350 17 G PO PACK: 17.0000 g | PACK | Freq: Every day | ORAL | Status: DC | PRN

## 2020-04-14 MED ORDER — DOCUSATE SODIUM 100 MG PO CAPS
100.0000 mg | ORAL_CAPSULE | Freq: Two times a day (BID) | ORAL | Status: DC
  Administered 2020-04-14 – 2020-04-19 (×7): 100 mg via ORAL
  Filled 2020-04-14 (×9): qty 1

## 2020-04-14 MED ORDER — SODIUM CHLORIDE 0.9 % IR SOLN
Status: DC | PRN
  Administered 2020-04-14: 1000 mL

## 2020-04-14 MED ORDER — VANCOMYCIN HCL IN DEXTROSE 1-5 GM/200ML-% IV SOLN
1000.0000 mg | Freq: Two times a day (BID) | INTRAVENOUS | Status: DC
  Administered 2020-04-15 – 2020-04-17 (×5): 1000 mg via INTRAVENOUS
  Filled 2020-04-14 (×6): qty 200

## 2020-04-14 MED ORDER — ENSURE PRE-SURGERY PO LIQD
296.0000 mL | Freq: Once | ORAL | Status: DC
  Filled 2020-04-14: qty 296

## 2020-04-14 MED ORDER — LIDOCAINE HCL (PF) 1 % IJ SOLN
INTRAMUSCULAR | Status: DC | PRN
  Administered 2020-04-14: 10 mL

## 2020-04-14 MED ORDER — OXYCODONE HCL 5 MG/5ML PO SOLN: 5.0000 mg | Freq: Once | ORAL | Status: DC | PRN

## 2020-04-14 MED ORDER — OXYCODONE HCL 5 MG PO TABS
5.0000 mg | ORAL_TABLET | ORAL | Status: DC | PRN
  Administered 2020-04-15 (×2): 10 mg via ORAL
  Administered 2020-04-19: 5 mg via ORAL
  Administered 2020-04-19: 10 mg via ORAL
  Filled 2020-04-14: qty 1
  Filled 2020-04-14 (×5): qty 2

## 2020-04-14 MED ORDER — ROPIVACAINE HCL 5 MG/ML IJ SOLN
INTRAMUSCULAR | Status: DC | PRN
  Administered 2020-04-14: 30 mL via PERINEURAL

## 2020-04-14 MED ORDER — HYDROMORPHONE HCL 1 MG/ML IJ SOLN
0.5000 mg | INTRAMUSCULAR | Status: DC | PRN
  Administered 2020-04-15: 1 mg via INTRAVENOUS
  Administered 2020-04-15: 0.5 mg via INTRAVENOUS
  Administered 2020-04-16 (×3): 1 mg via INTRAVENOUS
  Administered 2020-04-17 (×2): 0.5 mg via INTRAVENOUS
  Administered 2020-04-17: 1 mg via INTRAVENOUS
  Administered 2020-04-17 – 2020-04-19 (×3): 0.5 mg via INTRAVENOUS
  Filled 2020-04-14 (×13): qty 1

## 2020-04-14 MED ORDER — SODIUM CHLORIDE 0.9 % IV SOLN: INTRAVENOUS | Status: DC

## 2020-04-14 MED ORDER — GLYCOPYRROLATE PF 0.2 MG/ML IJ SOSY
PREFILLED_SYRINGE | INTRAMUSCULAR | Status: DC | PRN
  Administered 2020-04-14: .2 mg via INTRAVENOUS

## 2020-04-14 MED ORDER — MAGNESIUM CITRATE PO SOLN: 1.0000 | Freq: Once | ORAL | Status: DC | PRN

## 2020-04-14 MED ORDER — ONDANSETRON HCL 4 MG PO TABS: 4.0000 mg | ORAL_TABLET | Freq: Four times a day (QID) | ORAL | Status: DC | PRN

## 2020-04-14 MED ORDER — ACETAMINOPHEN 325 MG PO TABS
325.0000 mg | ORAL_TABLET | Freq: Four times a day (QID) | ORAL | Status: DC | PRN
Start: 2020-04-15 — End: 2020-04-19
  Administered 2020-04-15 – 2020-04-16 (×2): 650 mg via ORAL
  Filled 2020-04-14 (×2): qty 2

## 2020-04-14 MED ORDER — HYDROMORPHONE HCL 1 MG/ML IJ SOLN: 0.2500 mg | INTRAMUSCULAR | Status: DC | PRN

## 2020-04-14 MED ORDER — ACETAMINOPHEN 500 MG PO TABS
1000.0000 mg | ORAL_TABLET | Freq: Once | ORAL | Status: DC
  Filled 2020-04-14: qty 2

## 2020-04-14 MED ORDER — SODIUM CHLORIDE 0.9 % IR SOLN
Status: DC | PRN
  Administered 2020-04-14: 3000 mL

## 2020-04-14 MED ORDER — FENTANYL CITRATE (PF) 100 MCG/2ML IJ SOLN
INTRAMUSCULAR | Status: DC | PRN
  Administered 2020-04-14: 50 ug via INTRAVENOUS

## 2020-04-14 MED ORDER — ENSURE ENLIVE PO LIQD
237.0000 mL | Freq: Two times a day (BID) | ORAL | Status: DC
  Administered 2020-04-15 (×2): 237 mL via ORAL

## 2020-04-14 MED ORDER — ONDANSETRON HCL 4 MG/2ML IJ SOLN: 4.0000 mg | Freq: Four times a day (QID) | INTRAMUSCULAR | Status: DC | PRN

## 2020-04-14 MED ORDER — ORAL CARE MOUTH RINSE: 15.0000 mL | Freq: Once | OROMUCOSAL | Status: DC

## 2020-04-14 MED ORDER — VANCOMYCIN HCL 500 MG IV SOLR
INTRAVENOUS | Status: AC
  Filled 2020-04-14: qty 500

## 2020-04-14 MED ORDER — DEXMEDETOMIDINE (PRECEDEX) IN NS 20 MCG/5ML (4 MCG/ML) IV SYRINGE
PREFILLED_SYRINGE | INTRAVENOUS | Status: AC
  Filled 2020-04-14: qty 15

## 2020-04-14 MED ORDER — CHLORHEXIDINE GLUCONATE 0.12 % MT SOLN
OROMUCOSAL | Status: AC
  Filled 2020-04-14: qty 15

## 2020-04-14 MED ORDER — MIDAZOLAM HCL 2 MG/2ML IJ SOLN
2.0000 mg | Freq: Once | INTRAMUSCULAR | Status: AC
  Administered 2020-04-14: 2 mg via INTRAVENOUS

## 2020-04-14 MED ORDER — VANCOMYCIN HCL 1000 MG IV SOLR
INTRAVENOUS | Status: DC | PRN
  Administered 2020-04-14: 1000 mg via INTRAVENOUS

## 2020-04-14 MED ORDER — ONDANSETRON HCL 4 MG/2ML IJ SOLN
INTRAMUSCULAR | Status: AC
  Filled 2020-04-14: qty 2

## 2020-04-14 MED ORDER — OXYCODONE HCL 5 MG PO TABS
10.0000 mg | ORAL_TABLET | ORAL | Status: DC | PRN
  Administered 2020-04-15 – 2020-04-16 (×2): 15 mg via ORAL
  Administered 2020-04-16: 10 mg via ORAL
  Administered 2020-04-16 – 2020-04-18 (×7): 15 mg via ORAL
  Administered 2020-04-18: 10 mg via ORAL
  Filled 2020-04-14: qty 2
  Filled 2020-04-14 (×10): qty 3

## 2020-04-14 MED ORDER — GENTAMICIN SULFATE 40 MG/ML IJ SOLN
INTRAMUSCULAR | Status: AC
  Filled 2020-04-14: qty 4

## 2020-04-14 SURGICAL SUPPLY — 50 items
APL PRP STRL LF DISP 70% ISPRP (MISCELLANEOUS) ×1
BANDAGE ESMARK 6X9 LF (GAUZE/BANDAGES/DRESSINGS) ×1 IMPLANT
BLADE SURG 10 STRL SS (BLADE) ×2 IMPLANT
BNDG CMPR 9X6 STRL LF SNTH (GAUZE/BANDAGES/DRESSINGS) ×1
BNDG COHESIVE 4X5 TAN STRL (GAUZE/BANDAGES/DRESSINGS) ×2 IMPLANT
BNDG COHESIVE 6X5 TAN STRL LF (GAUZE/BANDAGES/DRESSINGS) ×2 IMPLANT
BNDG CONFORM 3 STRL LF (GAUZE/BANDAGES/DRESSINGS) ×2 IMPLANT
BNDG ESMARK 6X9 LF (GAUZE/BANDAGES/DRESSINGS) ×2
BNDG GAUZE ELAST 4 BULKY (GAUZE/BANDAGES/DRESSINGS) ×1 IMPLANT
CANISTER SUCT 3000ML PPV (MISCELLANEOUS) ×2 IMPLANT
CHLORAPREP W/TINT 26 (MISCELLANEOUS) ×2 IMPLANT
COVER SURGICAL LIGHT HANDLE (MISCELLANEOUS) ×2 IMPLANT
COVER WAND RF STERILE (DRAPES) ×2 IMPLANT
CUFF TOURN SGL QUICK 34 (TOURNIQUET CUFF) ×2
CUFF TOURN SGL QUICK 42 (TOURNIQUET CUFF) IMPLANT
CUFF TRNQT CYL 34X4.125X (TOURNIQUET CUFF) ×1 IMPLANT
DRSG MEPITEL 4X7.2 (GAUZE/BANDAGES/DRESSINGS) ×2 IMPLANT
DRSG PAD ABDOMINAL 8X10 ST (GAUZE/BANDAGES/DRESSINGS) ×2 IMPLANT
ELECT REM PT RETURN 9FT ADLT (ELECTROSURGICAL) ×2
ELECTRODE REM PT RTRN 9FT ADLT (ELECTROSURGICAL) ×1 IMPLANT
EVACUATOR SILICONE 100CC (DRAIN) IMPLANT
GAUZE SPONGE 4X4 12PLY STRL (GAUZE/BANDAGES/DRESSINGS) ×1 IMPLANT
GLOVE BIO SURGEON STRL SZ8 (GLOVE) ×2 IMPLANT
GLOVE BIOGEL PI IND STRL 8 (GLOVE) ×2 IMPLANT
GLOVE BIOGEL PI INDICATOR 8 (GLOVE) ×2
GLOVE ECLIPSE 8.0 STRL XLNG CF (GLOVE) ×2 IMPLANT
GOWN STRL REUS W/ TWL XL LVL3 (GOWN DISPOSABLE) ×2 IMPLANT
GOWN STRL REUS W/TWL XL LVL3 (GOWN DISPOSABLE) ×4
KIT BASIN OR (CUSTOM PROCEDURE TRAY) ×2 IMPLANT
KIT STIMULAN 5CC (Orthopedic Implant) ×1 IMPLANT
KIT TURNOVER KIT B (KITS) ×2 IMPLANT
NS IRRIG 1000ML POUR BTL (IV SOLUTION) ×2 IMPLANT
PACK ORTHO EXTREMITY (CUSTOM PROCEDURE TRAY) ×2 IMPLANT
PAD ARMBOARD 7.5X6 YLW CONV (MISCELLANEOUS) ×4 IMPLANT
PAD CAST 4YDX4 CTTN HI CHSV (CAST SUPPLIES) IMPLANT
PADDING CAST COTTON 4X4 STRL (CAST SUPPLIES) ×2
PADDING CAST SYN 6 (CAST SUPPLIES) ×1
PADDING CAST SYNTHETIC 6X4 NS (CAST SUPPLIES) IMPLANT
SET CYSTO W/LG BORE CLAMP LF (SET/KITS/TRAYS/PACK) ×4 IMPLANT
SPONGE LAP 4X18 RFD (DISPOSABLE) ×2 IMPLANT
SUCTION FRAZIER HANDLE 10FR (MISCELLANEOUS) ×2
SUCTION TUBE FRAZIER 10FR DISP (MISCELLANEOUS) ×1 IMPLANT
SUT ETHILON 2 0 FS 18 (SUTURE) IMPLANT
SUT ETHILON 3 0 PS 1 (SUTURE) IMPLANT
SUT VIC AB 2-0 CT1 27 (SUTURE)
SUT VIC AB 2-0 CT1 TAPERPNT 27 (SUTURE) IMPLANT
TOWEL GREEN STERILE (TOWEL DISPOSABLE) ×2 IMPLANT
TOWEL GREEN STERILE FF (TOWEL DISPOSABLE) ×2 IMPLANT
TUBE CONNECTING 12X1/4 (SUCTIONS) ×2 IMPLANT
YANKAUER SUCT BULB TIP NO VENT (SUCTIONS) ×2 IMPLANT

## 2020-04-14 NOTE — Progress Notes (Signed)
Pharmacy Antibiotic Note  Jeremy Hancock is a 50 y.o. male admitted on 04/13/2020 with R foot osteomyelitis. S/p I&D on 1/14 with plan for repeat next week.  Pharmacy has been consulted for vancomycin and cefepime dosing. Scr 0.86 with current CrCl ~92 ml/min.   Plan: Vancomycin 1500mg  x1 loading dose followed by 1000mg  q12h (estimated AUC 555 using Scr 0.86) Start cefepime 2g IV q8h Monitor renal function, cultures, and clinical progression   Height: 5\' 5"  (165.1 cm) Weight: 61.7 kg (136 lb 0.4 oz) IBW/kg (Calculated) : 61.5  Temp (24hrs), Avg:98.8 F (37.1 C), Min:97.7 F (36.5 C), Max:99.6 F (37.6 C)  Recent Labs  Lab 04/13/20 1509 04/13/20 2240  WBC 17.6*  --   CREATININE 0.86  --   LATICACIDVEN 1.5 1.1    Estimated Creatinine Clearance: 92.4 mL/min (by C-G formula based on SCr of 0.86 mg/dL).    Allergies  Allergen Reactions  . Amoxicillin Other (See Comments)    Severe abd pain Did it involve swelling of the face/tongue/throat, SOB, or low BP? No Did it involve sudden or severe rash/hives, skin peeling, or any reaction on the inside of your mouth or nose? No Did you need to seek medical attention at a hospital or doctor's office? No When did it last happen?5 + years If all above answers are "NO", may proceed with cephalosporin use.   . Codeine     Hot flashes but oxycodone is fine  . Hydrocodone-Acetaminophen     Hot flashes  . Morphine And Related     Hot flashes  . Statins Other (See Comments)    Severe myalgias  . Zyrtec [Cetirizine] Other (See Comments)    Bloody stool    Antimicrobials this admission: Vancomycin 1/14>> Cefepime 1/14 >>  Dose adjustments this admission: N/a  Microbiology results: 1/14 wound:  Thank you for allowing pharmacy to be a part of this patient's care.  2/14, PharmD Clinical Pharmacist  04/14/2020 2:19 PM

## 2020-04-14 NOTE — Interval H&P Note (Signed)
History and Physical Interval Note:  04/14/2020 12:16 PM  Jeremy Hancock  has presented today for surgery, with the diagnosis of right foot osteomyelitis.  The various methods of treatment have been discussed with the patient and family. After consideration of risks, benefits and other options for treatment, the patient has consented to  Procedure(s) with comments: Debridement of right foot abcess and calcaneus (Right) - as a surgical intervention.  The patient's history has been reviewed, patient examined, no change in status, stable for surgery.  I have reviewed the patient's chart and labs.  Questions were answered to the patient's satisfaction.    The risks and benefits of the alternative treatment options have been discussed in detail.  The patient wishes to proceed with surgery and specifically understands risks of bleeding, infection, nerve damage, blood clots, need for additional surgery, amputation and death.   Toni Arthurs

## 2020-04-14 NOTE — Plan of Care (Signed)

## 2020-04-14 NOTE — Anesthesia Procedure Notes (Signed)
Anesthesia Regional Block: Popliteal block   Pre-Anesthetic Checklist: ,, timeout performed, Correct Patient, Correct Site, Correct Laterality, Correct Procedure, Correct Position, site marked, Risks and benefits discussed,  Surgical consent,  Pre-op evaluation,  At surgeon's request and post-op pain management  Laterality: Right  Prep: Maximum Sterile Barrier Precautions used, chloraprep       Needles:  Injection technique: Single-shot  Needle Type: Echogenic Stimulator Needle     Needle Length: 9cm  Needle Gauge: 22     Additional Needles:   Procedures:,,,, ultrasound used (permanent image in chart),,,,  Narrative:  Start time: 04/14/2020 12:20 PM End time: 04/14/2020 12:25 PM Injection made incrementally with aspirations every 5 mL.  Performed by: Personally  Anesthesiologist: Lannie Fields, DO  Additional Notes: Monitors applied. No increased pain on injection. No increased resistance to injection. Injection made in 5cc increments. Good needle visualization. Patient tolerated procedure well.

## 2020-04-14 NOTE — Anesthesia Postprocedure Evaluation (Signed)
Anesthesia Post Note  Patient: Jeremy Hancock  Procedure(s) Performed: Debridement of right foot abcess and calcaneus (Right )     Patient location during evaluation: PACU Anesthesia Type: Regional and MAC Level of consciousness: awake and alert Pain management: pain level controlled Vital Signs Assessment: post-procedure vital signs reviewed and stable Respiratory status: spontaneous breathing, nonlabored ventilation and respiratory function stable Cardiovascular status: blood pressure returned to baseline and stable Postop Assessment: no apparent nausea or vomiting Anesthetic complications: no   No complications documented.  Last Vitals:  Vitals:   04/14/20 1509 04/14/20 1604  BP: 90/75 99/74  Pulse: 67 71  Resp: 18 18  Temp: (!) 36.4 C 36.6 C  SpO2: 96% 95%    Last Pain:  Vitals:   04/14/20 1604  TempSrc: Oral  PainSc: 0-No pain                 Pervis Hocking

## 2020-04-14 NOTE — Plan of Care (Signed)
  Problem: Education: Goal: Knowledge of General Education information will improve Description Including pain rating scale, medication(s)/side effects and non-pharmacologic comfort measures Outcome: Progressing   Problem: Nutrition: Goal: Adequate nutrition will be maintained Outcome: Progressing   Problem: Pain Managment: Goal: General experience of comfort will improve Outcome: Progressing   
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
## 2020-04-14 NOTE — ED Notes (Signed)
Report given to Sheila RN

## 2020-04-14 NOTE — Anesthesia Procedure Notes (Signed)
Procedure Name: MAC Date/Time: 04/14/2020 12:50 PM Performed by: Lowella Dell, CRNA Pre-anesthesia Checklist: Patient identified, Emergency Drugs available, Suction available, Patient being monitored and Timeout performed Oxygen Delivery Method: Simple face mask Placement Confirmation: positive ETCO2 Dental Injury: Teeth and Oropharynx as per pre-operative assessment

## 2020-04-14 NOTE — Transfer of Care (Signed)
Immediate Anesthesia Transfer of Care Note  Patient: Jeremy Hancock  Procedure(s) Performed: Debridement of right foot abcess and calcaneus (Right )  Patient Location: PACU  Anesthesia Type:MAC  Level of Consciousness: awake, alert  and oriented  Airway & Oxygen Therapy: Patient Spontanous Breathing and Patient connected to face mask oxygen  Post-op Assessment: Report given to RN and Post -op Vital signs reviewed and stable  Post vital signs: Reviewed and stable  Last Vitals:  Vitals Value Taken Time  BP 103/71 04/14/20 1334  Temp    Pulse 60 04/14/20 1335  Resp 20 04/14/20 1335  SpO2 100 % 04/14/20 1335  Vitals shown include unvalidated device data.  Last Pain:  Vitals:   04/14/20 1220  TempSrc:   PainSc: 10-Worst pain ever      Patients Stated Pain Goal: 5 (85/46/27 0350)  Complications: No complications documented.

## 2020-04-14 NOTE — Anesthesia Procedure Notes (Signed)
Anesthesia Regional Block: Adductor canal block   Pre-Anesthetic Checklist: ,, timeout performed, Correct Patient, Correct Site, Correct Laterality, Correct Procedure, Correct Position, site marked, Risks and benefits discussed,  Surgical consent,  Pre-op evaluation,  At surgeon's request and post-op pain management  Laterality: Right  Prep: Maximum Sterile Barrier Precautions used, chloraprep       Needles:  Injection technique: Single-shot  Needle Type: Echogenic Stimulator Needle     Needle Length: 9cm  Needle Gauge: 22     Additional Needles:   Procedures:,,,, ultrasound used (permanent image in chart),,,,  Narrative:  Start time: 04/14/2020 12:25 PM End time: 04/14/2020 12:30 PM Injection made incrementally with aspirations every 5 mL.  Performed by: Personally  Anesthesiologist: Lannie Fields, DO  Additional Notes: Monitors applied. No increased pain on injection. No increased resistance to injection. Injection made in 5cc increments. Good needle visualization. Patient tolerated procedure well.

## 2020-04-14 NOTE — Anesthesia Preprocedure Evaluation (Addendum)
Anesthesia Evaluation  Patient identified by MRN, date of birth, ID band Patient awake    Reviewed: Allergy & Precautions, NPO status , Patient's Chart, lab work & pertinent test results  Airway Mallampati: II  TM Distance: >3 FB Neck ROM: Full    Dental no notable dental hx.    Pulmonary Current Smoker and Patient abstained from smoking.,    Pulmonary exam normal breath sounds clear to auscultation       Cardiovascular + CAD  Normal cardiovascular exam Rhythm:Regular Rate:Normal  Cath 2018:  Prox LAD to Mid LAD lesion, 30 %stenosed.  The left ventricular systolic function is normal.  LV end diastolic pressure is normal.  The left ventricular ejection fraction is 55-65% by visual estimate.     Neuro/Psych negative neurological ROS  negative psych ROS   GI/Hepatic negative GI ROS, Neg liver ROS,   Endo/Other  negative endocrine ROS  Renal/GU negative Renal ROS     Musculoskeletal  (+) Arthritis , Osteoarthritis,  Right foot osteo   Abdominal   Peds  Hematology negative hematology ROS (+)   Anesthesia Other Findings   Reproductive/Obstetrics                            Anesthesia Physical Anesthesia Plan  ASA: III  Anesthesia Plan: Regional and MAC   Post-op Pain Management:  Regional for Post-op pain   Induction:   PONV Risk Score and Plan: Propofol infusion, TIVA and Treatment may vary due to age or medical condition  Airway Management Planned: Natural Airway and Simple Face Mask  Additional Equipment: None  Intra-op Plan:   Post-operative Plan:   Informed Consent: I have reviewed the patients History and Physical, chart, labs and discussed the procedure including the risks, benefits and alternatives for the proposed anesthesia with the patient or authorized representative who has indicated his/her understanding and acceptance.       Plan Discussed with:  CRNA  Anesthesia Plan Comments:        Anesthesia Quick Evaluation

## 2020-04-15 LAB — HEPATITIS C ANTIBODY: HCV Ab: NONREACTIVE
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
## 2020-04-15 NOTE — Progress Notes (Signed)
    [facility]   Reason for visit: Follow up on osteomyelitis  Interval History: culture growth now with Staph aureus.   On vancomycin and cefepime  Physical Exam: Constitutional:  Vitals:   04/15/20 0000 04/15/20 0353  BP: 107/72 92/66  Pulse: 60 (!) 53  Resp: 16 16  Temp: 98.7 F (37.1 C) 98.8 F (37.1 C)  SpO2: 96% 96%    Impression: calcaneous osteomyelitis with Staph aureus.    Plan: I will stop cefepime now and continue with vancomycin.  If MSSA then will change to cefazolin and stop vancomycin.
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
## 2020-04-15 NOTE — Progress Notes (Signed)
Subjective:  Patient reports pain as moderate to severe.  Denies N/V/CP/SOB. Patient is requesting pain medication. Nursing reports that patient was walking on the injured extremity last night against orders.   Objective:   VITALS:   Vitals:   04/14/20 2006 04/14/20 2007 04/15/20 0000 04/15/20 0353  BP: 103/68  107/72 92/66  Pulse: 67  60 (!) 53  Resp: 16  16 16   Temp:  98.2 F (36.8 C) 98.7 F (37.1 C) 98.8 F (37.1 C)  TempSrc:  Oral Oral   SpO2: 96%  96% 96%  Weight:      Height:        NAD ABD soft Neurovascular intact Sensation intact distally Splint in place Patient able to move all toes Brisk cap refill  Lab Results  Component Value Date   WBC 17.6 (H) 04/13/2020   HGB 14.1 04/13/2020   HCT 44.4 04/13/2020   MCV 91.4 04/13/2020   PLT 442 (H) 04/13/2020   BMET    Component Value Date/Time   NA 139 04/13/2020 1509   NA 140 04/05/2013 1943   K 3.6 04/13/2020 1509   K 3.7 04/05/2013 1943   CL 99 04/13/2020 1509   CL 106 04/05/2013 1943   CO2 28 04/13/2020 1509   CO2 30 04/05/2013 1943   GLUCOSE 103 (H) 04/13/2020 1509   GLUCOSE 84 04/05/2013 1943   BUN 7 04/13/2020 1509   BUN 8 04/05/2013 1943   CREATININE 0.86 04/13/2020 1509   CREATININE 0.95 07/31/2015 0846   CALCIUM 8.9 04/13/2020 1509   CALCIUM 8.9 04/05/2013 1943   GFRNONAA >60 04/13/2020 1509   GFRNONAA >89 07/31/2015 0846   GFRAA >60 05/24/2019 1246   GFRAA >89 07/31/2015 0846   Recent Results (from the past 240 hour(s))  Resp Panel by RT-PCR (Flu A&B, Covid) Nasopharyngeal Swab     Status: None   Collection Time: 04/13/20  2:10 AM   Specimen: Nasopharyngeal Swab; Nasopharyngeal(NP) swabs in vial transport medium  Result Value Ref Range Status   SARS Coronavirus 2 by RT PCR NEGATIVE NEGATIVE Final    Comment: (NOTE) SARS-CoV-2 target nucleic acids are NOT DETECTED.  The SARS-CoV-2 RNA is generally detectable in upper respiratory specimens during the acute phase of infection. The  lowest concentration of SARS-CoV-2 viral copies this assay can detect is 138 copies/mL. A negative result does not preclude SARS-Cov-2 infection and should not be used as the sole basis for treatment or other patient management decisions. A negative result may occur with  improper specimen collection/handling, submission of specimen other than nasopharyngeal swab, presence of viral mutation(s) within the areas targeted by this assay, and inadequate number of viral copies(<138 copies/mL). A negative result must be combined with clinical observations, patient history, and epidemiological information. The expected result is Negative.  Fact Sheet for Patients:  04/15/20  Fact Sheet for Healthcare Providers:  BloggerCourse.com  This test is no t yet approved or cleared by the [place] FDA and  has been authorized for detection and/or diagnosis of SARS-CoV-2 by FDA under an Emergency Use Authorization (EUA). This EUA will remain  in effect (meaning this test can be used) for the duration of the COVID-19 declaration under Section 564(b)(1) of the Act, 21 U.S.C.section 360bbb-3(b)(1), unless the authorization is terminated  or revoked sooner.       Influenza A by PCR NEGATIVE NEGATIVE Final   Influenza B by PCR NEGATIVE NEGATIVE Final    Comment: (NOTE) The Xpert Xpress SARS-CoV-2/FLU/RSV plus assay  is intended as an aid in the diagnosis of influenza from Nasopharyngeal swab specimens and should not be used as a sole basis for treatment. Nasal washings and aspirates are unacceptable for Xpert Xpress SARS-CoV-2/FLU/RSV testing.  Fact Sheet for Patients: BloggerCourse.com  Fact Sheet for Healthcare Providers: SeriousBroker.it  This test is not yet approved or cleared by the [place] FDA and has been authorized for detection and/or diagnosis of SARS-CoV-2 by FDA under  an Emergency Use Authorization (EUA). This EUA will remain in effect (meaning this test can be used) for the duration of the COVID-19 declaration under Section 564(b)(1) of the Act, 21 U.S.C. section 360bbb-3(b)(1), unless the authorization is terminated or revoked.  Performed at [facility], [address], [place], [place] [address]   MRSA PCR Screening     Status: None   Collection Time: 04/14/20  2:11 AM   Specimen: Nasopharyngeal  Result Value Ref Range Status   MRSA by PCR NEGATIVE NEGATIVE Final    Comment:        The GeneXpert MRSA Assay (FDA approved for NASAL specimens only), is one component of a comprehensive MRSA colonization surveillance program. It is not intended to diagnose MRSA infection nor to guide or monitor treatment for MRSA infections. Performed at [facility], [address], [place], [place] [address]   Aerobic/Anaerobic Culture (surgical/deep wound)     Status: None (Preliminary result)   Collection Time: 04/14/20  1:02 PM   Specimen: PATH Soft tissue  Result Value Ref Range Status   Specimen Description TISSUE  Final   Special Requests RIGHT CALCANEUS SPEC A  Final   Gram Stain   Final    ABUNDANT WBC PRESENT, PREDOMINANTLY PMN FEW GRAM POSITIVE COCCI IN PAIRS IN CLUSTERS Performed at [facility], [address] [place], [place] [address]    Culture PENDING  Incomplete   Report Status PENDING  Incomplete     Assessment/Plan: 1 Day Post-Op   Active Problems:   Chronic osteomyelitis of right foot (HCC)   NWB RLE DVT ppx: SCDs PO pain control PT/OT ID following, cultures pending results/   Dispo: D/C pending     Darrick Grinder 04/15/2020, 8:32 AM [facility] is now [employer] [address], [place], [place] [address] Phone: [phone] [email or web address] Facebook  [employer]

## 2020-04-16 ENCOUNTER — Inpatient Hospital Stay: Payer: Self-pay

## 2020-04-16 ENCOUNTER — Encounter (HOSPITAL_COMMUNITY): Payer: Self-pay | Admitting: Orthopedic Surgery

## 2020-04-16 MED ORDER — CHLORHEXIDINE GLUCONATE CLOTH 2 % EX PADS
6.0000 | MEDICATED_PAD | Freq: Every day | CUTANEOUS | Status: DC
  Administered 2020-04-17 – 2020-04-19 (×4): 6 via TOPICAL

## 2020-04-16 MED ORDER — SODIUM CHLORIDE 0.9% FLUSH
10.0000 mL | Freq: Two times a day (BID) | INTRAVENOUS | Status: DC
  Administered 2020-04-16 – 2020-04-19 (×2): 10 mL

## 2020-04-16 MED ORDER — SODIUM CHLORIDE 0.9% FLUSH: 10.0000 mL | INTRAVENOUS | Status: DC | PRN

## 2020-04-16 NOTE — Progress Notes (Signed)
Subjective: 2 Days Post-Op Procedure(s) (LRB): Debridement of right foot abcess and calcaneus (Right) Patient reports pain as moderate and severe.    Objective: Vital signs in last 24 hours: Temp:  [97.9 F (36.6 C)-98.4 F (36.9 C)] 97.9 F (36.6 C) (01/16 0355) Pulse Rate:  [61-67] 67 (01/16 0355) Resp:  [16-18] 18 (01/16 0355) BP: (104-116)/(76-80) 104/80 (01/16 0355) SpO2:  [97 %-98 %] 97 % (01/16 0355)  Intake/Output from previous day: No intake/output data recorded. Intake/Output this shift: No intake/output data recorded.  Recent Labs    04/13/20 1509  HGB 14.1   Recent Labs    04/13/20 1509  WBC 17.6*  RBC 4.86  HCT 44.4  PLT 442*   Recent Labs    04/13/20 1509  NA 139  K 3.6  CL 99  CO2 28  BUN 7  CREATININE 0.86  GLUCOSE 103*  CALCIUM 8.9   No results for input(s): LABPT, INR in the last 72 hours.  Neurologically intact ABD soft Neurovascular intact Sensation intact distally Intact pulses distally Dorsiflexion/Plantar flexion intact Incision: dressing C/D/I and no drainage No cellulitis present Compartment soft no sign of DVT  Cultures:  ABUNDANT WBC PRESENT, PREDOMINANTLY PMN  FEW GRAM POSITIVE COCCI IN PAIRS IN CLUSTERS  Performed at [facility], [address], [place], [place] [address]   Culture MODERATE STAPHYLOCOCCUS AUREUS      Assessment/Plan: 2 Days Post-Op Procedure(s) (LRB): Debridement of right foot abcess and calcaneus (Right) Advance diet Up with therapy Continue abx per ID recommendations Final cultures pending  NWB RLE Elevation to reduce swelling Pain control  Dorothy Spark 04/16/2020, 8:14 AM

## 2020-04-16 NOTE — Progress Notes (Signed)
Peripherally Inserted Central Catheter Placement  The IV Nurse has discussed with the patient and/or persons authorized to consent for the patient, the purpose of this procedure and the potential benefits and risks involved with this procedure.  The benefits include less needle sticks, lab draws from the catheter, and the patient may be discharged home with the catheter. Risks include, but not limited to, infection, bleeding, blood clot (thrombus formation), and puncture of an artery; nerve damage and irregular heartbeat and possibility to perform a PICC exchange if needed/ordered by physician.  Alternatives to this procedure were also discussed.  Bard Power PICC patient education guide, fact sheet on infection prevention and patient information card has been provided to patient /or left at bedside.    PICC Placement Documentation  PICC Single Lumen 04/16/20 PICC Right Brachial 37 cm 0 cm (Active)  Indication for Insertion or Continuance of Line Home intravenous therapies (PICC only) 04/16/20 1743  Exposed Catheter (cm) 0 cm 04/16/20 1743  Site Assessment Clean;Dry;Intact 04/16/20 1743  Line Status Flushed;Saline locked;Blood return noted 04/16/20 1743  Dressing Status Clean;Dry;Intact 04/16/20 1743  Antimicrobial disc in place? Yes 04/16/20 1743  Safety Lock Not Applicable 04/16/20 1743  Dressing Intervention New dressing 04/16/20 1743  Dressing Change Due 04/23/20 04/16/20 1743       Ethelda Chick 04/16/2020, 5:45 PM

## 2020-04-16 NOTE — Progress Notes (Signed)
No changes from yesterday, growing Staph aureus and sensitivities pending.  On vancomycin.  I have ordered a picc line to be placed.  ID team will finalize tomorrow once sensitivities are known.  Will need 6 weeks.  This has been discussed with the patient and his wife.   Gardiner Barefoot, MD

## 2020-04-17 DIAGNOSIS — M86671 Other chronic osteomyelitis, right ankle and foot: Secondary | ICD-10-CM | POA: Diagnosis not present

## 2020-04-17 DIAGNOSIS — M869 Osteomyelitis, unspecified: Secondary | ICD-10-CM | POA: Diagnosis not present

## 2020-04-17 DIAGNOSIS — A4902 Methicillin resistant Staphylococcus aureus infection, unspecified site: Secondary | ICD-10-CM | POA: Diagnosis not present

## 2020-04-17 LAB — BASIC METABOLIC PANEL
Anion gap: 12 (ref 5–15)
BUN: 11 mg/dL (ref 6–20)
CO2: 28 mmol/L (ref 22–32)
Calcium: 8.9 mg/dL (ref 8.9–10.3)
Chloride: 99 mmol/L (ref 98–111)
Creatinine, Ser: 0.97 mg/dL (ref 0.61–1.24)
GFR, Estimated: >60 mL/min (ref >60)
Glucose, Bld: 125 mg/dL — ABNORMAL HIGH (ref 70–99)
Potassium: 3.6 mmol/L (ref 3.5–5.1)
Sodium: 139 mmol/L (ref 135–145)

## 2020-04-17 MED ORDER — CELECOXIB 200 MG PO CAPS
400.0000 mg | ORAL_CAPSULE | Freq: Once | ORAL | Status: AC
  Administered 2020-04-17: 400 mg via ORAL
  Filled 2020-04-17: qty 2

## 2020-04-17 MED ORDER — LACTATED RINGERS IV SOLN: INTRAVENOUS | Status: DC

## 2020-04-17 MED ORDER — POVIDONE-IODINE 10 % EX SWAB: 2.0000 "application " | Freq: Once | CUTANEOUS | Status: DC

## 2020-04-17 MED ORDER — HYDROMORPHONE HCL 1 MG/ML IJ SOLN: 0.5000 mg | INTRAMUSCULAR | Status: DC | PRN

## 2020-04-17 MED ORDER — SODIUM CHLORIDE 0.9 % IV SOLN
500.0000 mg | Freq: Every day | INTRAVENOUS | Status: DC
  Administered 2020-04-17 – 2020-04-18 (×2): 500 mg via INTRAVENOUS
  Filled 2020-04-17 (×3): qty 10

## 2020-04-17 MED ORDER — MUPIROCIN 2 % EX OINT
1.0000 "application " | TOPICAL_OINTMENT | Freq: Two times a day (BID) | CUTANEOUS | Status: DC
  Administered 2020-04-17 – 2020-04-19 (×5): 1 via NASAL
  Filled 2020-04-17 (×2): qty 22
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
## 2020-04-17 NOTE — Plan of Care (Signed)
  Problem: Coping: Goal: Level of anxiety will decrease Outcome: Progressing   Problem: Pain Managment: Goal: General experience of comfort will improve Outcome: Progressing   Problem: Safety: Goal: Ability to remain free from injury will improve Outcome: Progressing   Problem: Skin Integrity: Goal: Risk for impaired skin integrity will decrease Outcome: Progressing   

## 2020-04-17 NOTE — Plan of Care (Signed)

## 2020-04-17 NOTE — Progress Notes (Signed)
[facility]    Date of Admission:  04/13/2020   Total days of antibiotics day4           ID: Jeremy Hancock is a 51 y.o. male with  MRSA right calcaneal osteomyelitis Active Problems:   Chronic osteomyelitis of right foot (HCC)    Subjective: Patient having breakthrough pain, no fevers, he reports that he is going to have repeat debridement tomorrow. He did report yesterday that his right forearm IV infiltrated. Had right upper arm PICC line placed without difficulty  ROS: no fever, chills, nightsweats, no N/V/D. Just pain  Medications:  . acetaminophen  1,000 mg Oral Once  . Chlorhexidine Gluconate Cloth  6 each Topical Daily  . docusate sodium  100 mg Oral BID  . feeding supplement  237 mL Oral BID BM  . multivitamin with minerals  1 tablet Oral Daily  . mupirocin ointment  1 application Nasal BID  . senna  1 tablet Oral BID  . sodium chloride flush  10-40 mL Intracatheter Q12H    Objective: Vital signs in last 24 hours: Temp:  [97.9 F (36.6 C)-99.8 F (37.7 C)] 97.9 F (36.6 C) (01/17 0753) Pulse Rate:  [64-80] 69 (01/17 0753) Resp:  [17-20] 17 (01/17 0753) BP: (103-121)/(74-77) 103/74 (01/17 0753) SpO2:  [94 %-97 %] 94 % (01/17 0753) Physical Exam  Constitutional: He is oriented to person, place, and time. He appears well-developed and well-nourished. No distress.  HENT:  Mouth/Throat: Oropharynx is clear and moist. No oropharyngeal exudate.  Cardiovascular: Normal rate, regular rhythm and normal heart sounds. Exam reveals no gallop and no friction rub.  No murmur heard.  Pulmonary/Chest: Effort normal and breath sounds normal. No respiratory distress. He has no wheezes.  Abdominal: Soft. Bowel sounds are normal. He exhibits no distension. There is no tenderness.  Ext: right foot in cast Neurological: He is alert and oriented to person, place, and time.  Skin: Skin is warm and dry. No rash noted. No erythema.  Psychiatric: He has  a normal mood and affect. His behavior is normal.     Lab Results Recent Labs    04/17/20 0322  NA 139  K 3.6  CL 99  CO2 28  BUN 11  CREATININE 0.97   Lab Results  Component Value Date   ESRSEDRATE 71 (H) 04/13/2020    Microbiology: Methicillin resistant staphylococcus aureus      MIC    CIPROFLOXACIN >=8 RESISTANT  Resistant    CLINDAMYCIN <=0.25 SENS... Sensitive    ERYTHROMYCIN <=0.25 SENS... Sensitive    GENTAMICIN <=0.5 SENSI... Sensitive    Inducible Clindamycin NEGATIVE  Sensitive    OXACILLIN >=4 RESISTANT  Resistant    RIFAMPIN <=0.5 SENSI... Sensitive    TETRACYCLINE <=1 SENSITIVE  Sensitive    TRIMETH/SULFA <=10 SENSIT... Sensitive    VANCOMYCIN 1 SENSITIVE  Sensitive     Studies/Results: [place] EKG SITE RITE  Result Date: 04/16/2020 If Site Rite image not attached, placement could not be confirmed due to current cardiac rhythm.    Assessment/Plan: Right calcaneal osteomyelitis with MRSA = continue with vancomycin for the time being. Awaiting for home health to see if he is covered for daptomycin, which would be easier for administration and monitoring  Leukocytosis = will check wbc today  Pain management = defer to primary team for dosing and frequency  [facility] Cell: [phone] Pager: [phone]  04/17/2020, 11:58 AM
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
## 2020-04-17 NOTE — Progress Notes (Signed)
Subjective: 3 Days Post-Op Procedure(s) (LRB): Debridement of right foot abcess and calcaneus (Right) Patient reports pain as mild.    Managed with dilaudid and oxy.  No nsaids right now.  ID notes reviewed.  Cultures grew MRSA.  Picc line is in.  Pt on daptomycin currently.  Objective: Vital signs in last 24 hours: Temp:  [97.9 F (36.6 C)-98.9 F (37.2 C)] 97.9 F (36.6 C) 2022/04/22 0753) Pulse Rate:  [64-69] 69 04-22-22 0753) Resp:  [17-20] 17 April 22, 2022 0753) BP: (103-110)/(74-77) 103/74 April 22, 2022 0753) SpO2:  [94 %-97 %] 94 % 04/22/22 0753)  Intake/Output from previous day: 01/16 0701 - 04/22/2022 0700 In: 480 [P.O.:480] Out: 1425 [Urine:1425] Intake/Output this shift: Total I/O In: 200 [IV Piggyback:200] Out: -   No results for input(s): HGB in the last 72 hours. No results for input(s): WBC, RBC, HCT, PLT in the last 72 hours. Recent Labs    2020-04-22 0322  NA 139  K 3.6  CL 99  CO2 28  BUN 11  CREATININE 0.97  GLUCOSE 125*  CALCIUM 8.9   No results for input(s): LABPT, INR in the last 72 hours.  PE:  wn wd male in nad.  R LE splinted.  NVI at R forefoot.   Assessment/Plan: 3 Days Post-Op Procedure(s) (LRB): Debridement of right foot abcess and calcaneus (Right) To OR tomorrow for repeat I and D and vanc beads.  NPO after midnight.  No lovenox.  Pt understands the plan and agrees.  The risks and benefits of the alternative treatment options have been discussed in detail.  The patient wishes to proceed with surgery and specifically understands risks of bleeding, infection, nerve damage, blood clots, need for additional surgery, amputation and death.      Toni Arthurs 2020-04-22, 4:18 PM

## 2020-04-17 NOTE — Anesthesia Preprocedure Evaluation (Addendum)
Anesthesia Evaluation  Patient identified by MRN, date of birth, ID band Patient awake    Reviewed: Allergy & Precautions, NPO status , Patient's Chart, lab work & pertinent test results  History of Anesthesia Complications Negative for: history of anesthetic complications  Airway Mallampati: II  TM Distance: >3 FB Neck ROM: Full    Dental no notable dental hx. (+) Poor Dentition, Missing, Chipped,    Pulmonary Current Smoker and Patient abstained from smoking.,    Pulmonary exam normal        Cardiovascular negative cardio ROS Normal cardiovascular exam     Neuro/Psych negative neurological ROS  negative psych ROS   GI/Hepatic negative GI ROS, Neg liver ROS,   Endo/Other  negative endocrine ROS  Renal/GU negative Renal ROS  negative genitourinary   Musculoskeletal  (+) Arthritis , right foot osteomyelitis   Abdominal   Peds  Hematology negative hematology ROS (+)   Anesthesia Other Findings Day of surgery medications reviewed with patient.  Reproductive/Obstetrics negative OB ROS                           Anesthesia Physical Anesthesia Plan  ASA: II  Anesthesia Plan: General   Post-op Pain Management:    Induction: Intravenous  PONV Risk Score and Plan: Treatment may vary due to age or medical condition, Ondansetron and Midazolam  Airway Management Planned: LMA  Additional Equipment: None  Intra-op Plan:   Post-operative Plan: Extubation in OR  Informed Consent: I have reviewed the patients History and Physical, chart, labs and discussed the procedure including the risks, benefits and alternatives for the proposed anesthesia with the patient or authorized representative who has indicated his/her understanding and acceptance.     Dental advisory given  Plan Discussed with: CRNA  Anesthesia Plan Comments: (Patient declines regional block for postop pain control. Stephannie Peters,  MD)     Anesthesia Quick Evaluation

## 2020-04-17 NOTE — Progress Notes (Signed)
Pharmacy Antibiotic Note  Jeremy Hancock is a 50 y.o. male admitted on 04/13/2020 with R foot osteomyelitis. S/p I&D on 1/14.  Pharmacy has been consulted for vancomycin dosing; culture growing MRSA.  Renal function stable, afebrile, WBC 17.6.  Plan: Continue vanc 1gm IV Q12H Monitor renal fxn, clinical progress, check vanc peak/trough Plan for 6 weeks of IV antibiotic; PICC placed   Height: 5\' 5"  (165.1 cm) Weight: 61.7 kg (136 lb 0.4 oz) IBW/kg (Calculated) : 61.5  Temp (24hrs), Avg:98.9 F (37.2 C), Min:97.9 F (36.6 C), Max:99.8 F (37.7 C)  Recent Labs  Lab 04/13/20 1509 04/13/20 2240 04/17/20 0322  WBC 17.6*  --   --   CREATININE 0.86  --  0.97  LATICACIDVEN 1.5 1.1  --     Estimated Creatinine Clearance: 81.9 mL/min (by C-G formula based on SCr of 0.97 mg/dL).    Allergies  Allergen Reactions  . Amoxicillin Other (See Comments)    Severe abd pain Did it involve swelling of the face/tongue/throat, SOB, or low BP? No Did it involve sudden or severe rash/hives, skin peeling, or any reaction on the inside of your mouth or nose? No Did you need to seek medical attention at a hospital or doctor's office? No When did it last happen?5 + years If all above answers are "NO", may proceed with cephalosporin use.   . Codeine     Hot flashes but oxycodone is fine  . Hydrocodone-Acetaminophen     Hot flashes  . Morphine And Related     Hot flashes  . Statins Other (See Comments)    Severe myalgias  . Zyrtec [Cetirizine] Other (See Comments)    Bloody stool    Vanc 1/14 >>  Cefepime 1/14 >>  1/14 MRSA PCR - negative 1/14 R calcaneus, spec A - MRSA 1/14 R foot fungal -   [name] D. 05-08-1980, PharmD, BCPS, BCCCP 04/17/2020, 9:23 AM

## 2020-04-17 NOTE — Progress Notes (Signed)
Pharmacy Antibiotic Note  Jeremy Hancock is a 50 y.o. male admitted on 04/13/2020 with R foot osteomyelitis. S/p I&D on 1/14.  Pharmacy has been consulted for vancomycin dosing; culture growing MRSA.  Renal function stable, afebrile, WBC 17.6.  Plan: Stop vancomycin  Start daptomycin 8mg /kg 500mg  IV q24h Monitor renal fxn, clinical progress, check vanc peak/trough Plan for 6 weeks of IV antibiotic; PICC placed   Height: 5\' 5"  (165.1 cm) Weight: 61.7 kg (136 lb 0.4 oz) IBW/kg (Calculated) : 61.5  Temp (24hrs), Avg:98.9 F (37.2 C), Min:97.9 F (36.6 C), Max:99.8 F (37.7 C)  Recent Labs  Lab 04/13/20 1509 04/13/20 2240 04/17/20 0322  WBC 17.6*  --   --   CREATININE 0.86  --  0.97  LATICACIDVEN 1.5 1.1  --     Estimated Creatinine Clearance: 81.9 mL/min (by C-G formula based on SCr of 0.97 mg/dL).    Allergies  Allergen Reactions  . Amoxicillin Other (See Comments)    Severe abd pain Did it involve swelling of the face/tongue/throat, SOB, or low BP? No Did it involve sudden or severe rash/hives, skin peeling, or any reaction on the inside of your mouth or nose? No Did you need to seek medical attention at a hospital or doctor's office? No When did it last happen?5 + years If all above answers are "NO", may proceed with cephalosporin use.   . Codeine     Hot flashes but oxycodone is fine  . Hydrocodone-Acetaminophen     Hot flashes  . Morphine And Related     Hot flashes  . Statins Other (See Comments)    Severe myalgias  . Zyrtec [Cetirizine] Other (See Comments)    Bloody stool    Vanc 1/14 >> 1/17 daptomycin 1/17>> Cefepime 1/14 >>  1/14 MRSA PCR - negative 1/14 R calcaneus, spec A - MRSA 1/14 R foot fungal -   2/14, PharmD, BCIDP Infectious Disease Pharmacist  Phone: [phone] 04/17/2020, 1:45 PM

## 2020-04-18 ENCOUNTER — Encounter (HOSPITAL_COMMUNITY)
Admission: EM | Disposition: A | Discharge status: Home Health | Payer: Self-pay | Source: Home / Self Care | Attending: Orthopedic Surgery

## 2020-04-18 ENCOUNTER — Inpatient Hospital Stay (HOSPITAL_COMMUNITY): Payer: Medicare HMO | Admitting: Anesthesiology

## 2020-04-18 DIAGNOSIS — A4902 Methicillin resistant Staphylococcus aureus infection, unspecified site: Secondary | ICD-10-CM | POA: Diagnosis not present

## 2020-04-18 DIAGNOSIS — M869 Osteomyelitis, unspecified: Secondary | ICD-10-CM | POA: Diagnosis not present

## 2020-04-18 DIAGNOSIS — M86671 Other chronic osteomyelitis, right ankle and foot: Secondary | ICD-10-CM | POA: Diagnosis not present

## 2020-04-18 HISTORY — PX: INCISION AND DRAINAGE OF WOUND: SHX1803

## 2020-04-18 SURGERY — IRRIGATION AND DEBRIDEMENT WOUND
Anesthesia: General | Laterality: Right

## 2020-04-18 MED ORDER — FENTANYL CITRATE (PF) 100 MCG/2ML IJ SOLN
25.0000 ug | INTRAMUSCULAR | Status: DC | PRN
Start: 2020-04-18 — End: 2020-04-18

## 2020-04-18 MED ORDER — BUPIVACAINE-EPINEPHRINE (PF) 0.25% -1:200000 IJ SOLN
INTRAMUSCULAR | Status: DC | PRN
  Administered 2020-04-18: 10 mL via PERINEURAL

## 2020-04-18 MED ORDER — 0.9 % SODIUM CHLORIDE (POUR BTL) OPTIME
TOPICAL | Status: DC | PRN
  Administered 2020-04-18: 1000 mL

## 2020-04-18 MED ORDER — GABAPENTIN 300 MG PO CAPS
600.0000 mg | ORAL_CAPSULE | Freq: Every day | ORAL | Status: DC
  Administered 2020-04-18: 600 mg via ORAL
  Filled 2020-04-18: qty 2

## 2020-04-18 MED ORDER — PROPOFOL 10 MG/ML IV BOLUS
INTRAVENOUS | Status: DC | PRN
  Administered 2020-04-18: 120 mg via INTRAVENOUS
  Administered 2020-04-18: 20 mg via INTRAVENOUS

## 2020-04-18 MED ORDER — DEXAMETHASONE SODIUM PHOSPHATE 10 MG/ML IJ SOLN
INTRAMUSCULAR | Status: DC | PRN
  Administered 2020-04-18: 4 mg via INTRAVENOUS

## 2020-04-18 MED ORDER — ONDANSETRON HCL 4 MG/2ML IJ SOLN: 4.0000 mg | Freq: Four times a day (QID) | INTRAMUSCULAR | Status: DC | PRN

## 2020-04-18 MED ORDER — DIPHENHYDRAMINE HCL 12.5 MG/5ML PO ELIX
12.5000 mg | ORAL_SOLUTION | ORAL | Status: DC | PRN
  Filled 2020-04-18: qty 10

## 2020-04-18 MED ORDER — ONDANSETRON HCL 4 MG/2ML IJ SOLN
INTRAMUSCULAR | Status: DC | PRN
  Administered 2020-04-18: 4 mg via INTRAVENOUS

## 2020-04-18 MED ORDER — DOCUSATE SODIUM 100 MG PO CAPS: 100.0000 mg | ORAL_CAPSULE | Freq: Two times a day (BID) | ORAL | Status: DC

## 2020-04-18 MED ORDER — LIDOCAINE 2% (20 MG/ML) 5 ML SYRINGE
INTRAMUSCULAR | Status: DC | PRN
  Administered 2020-04-18: 100 mg via INTRAVENOUS

## 2020-04-18 MED ORDER — FENTANYL CITRATE (PF) 100 MCG/2ML IJ SOLN
INTRAMUSCULAR | Status: DC | PRN
  Administered 2020-04-18: 50 ug via INTRAVENOUS
  Administered 2020-04-18: 100 ug via INTRAVENOUS
  Administered 2020-04-18: 50 ug via INTRAVENOUS

## 2020-04-18 MED ORDER — OXYCODONE HCL 5 MG PO TABS
5.0000 mg | ORAL_TABLET | Freq: Once | ORAL | Status: DC | PRN
Start: 2020-04-18 — End: 2020-04-18

## 2020-04-18 MED ORDER — HYDROMORPHONE HCL 1 MG/ML IJ SOLN: 0.5000 mg | INTRAMUSCULAR | Status: DC | PRN

## 2020-04-18 MED ORDER — METHOCARBAMOL 500 MG PO TABS
500.0000 mg | ORAL_TABLET | Freq: Four times a day (QID) | ORAL | Status: DC | PRN
  Administered 2020-04-18 (×2): 500 mg via ORAL
  Filled 2020-04-18 (×3): qty 1

## 2020-04-18 MED ORDER — FENTANYL CITRATE (PF) 100 MCG/2ML IJ SOLN
25.0000 ug | INTRAMUSCULAR | Status: DC | PRN
Start: 2020-04-18 — End: 2020-04-18
  Administered 2020-04-18 (×2): 50 ug via INTRAVENOUS

## 2020-04-18 MED ORDER — OXYCODONE HCL 5 MG PO TABS: 10.0000 mg | ORAL_TABLET | ORAL | Status: DC | PRN

## 2020-04-18 MED ORDER — OXYCODONE HCL 5 MG/5ML PO SOLN: 5.0000 mg | Freq: Once | ORAL | Status: DC | PRN

## 2020-04-18 MED ORDER — MIDAZOLAM HCL 5 MG/5ML IJ SOLN
INTRAMUSCULAR | Status: DC | PRN
  Administered 2020-04-18: 2 mg via INTRAVENOUS

## 2020-04-18 MED ORDER — SODIUM CHLORIDE 0.9 % IV SOLN: INTRAVENOUS | Status: DC

## 2020-04-18 MED ORDER — PROMETHAZINE HCL 25 MG/ML IJ SOLN: 6.2500 mg | INTRAMUSCULAR | Status: DC | PRN

## 2020-04-18 MED ORDER — VANCOMYCIN HCL 1000 MG IV SOLR
INTRAVENOUS | Status: DC | PRN
  Administered 2020-04-18: 1000 mg via TOPICAL

## 2020-04-18 MED ORDER — ONDANSETRON HCL 4 MG PO TABS: 4.0000 mg | ORAL_TABLET | Freq: Four times a day (QID) | ORAL | Status: DC | PRN

## 2020-04-18 MED ORDER — OXYCODONE HCL 5 MG PO TABS: 5.0000 mg | ORAL_TABLET | ORAL | Status: DC | PRN

## 2020-04-18 MED ORDER — ACETAMINOPHEN 325 MG PO TABS: 325.0000 mg | ORAL_TABLET | Freq: Four times a day (QID) | ORAL | Status: DC | PRN

## 2020-04-18 MED ORDER — SENNA 8.6 MG PO TABS: 1.0000 | ORAL_TABLET | Freq: Two times a day (BID) | ORAL | Status: DC

## 2020-04-18 MED ORDER — METHOCARBAMOL 1000 MG/10ML IJ SOLN
500.0000 mg | Freq: Four times a day (QID) | INTRAVENOUS | Status: DC | PRN
  Filled 2020-04-18: qty 5

## 2020-04-18 MED ORDER — SODIUM CHLORIDE 0.9 % IR SOLN
Status: DC | PRN
  Administered 2020-04-18: 3000 mL

## 2020-04-18 SURGICAL SUPPLY — 55 items
APL PRP STRL LF DISP 70% ISPRP (MISCELLANEOUS) ×1
BANDAGE ESMARK 6X9 LF (GAUZE/BANDAGES/DRESSINGS) ×1 IMPLANT
BLADE SURG 10 STRL SS (BLADE) ×2 IMPLANT
BNDG CMPR 9X6 STRL LF SNTH (GAUZE/BANDAGES/DRESSINGS) ×1
BNDG COHESIVE 4X5 TAN STRL (GAUZE/BANDAGES/DRESSINGS) ×2 IMPLANT
BNDG COHESIVE 6X5 TAN NS LF (GAUZE/BANDAGES/DRESSINGS) ×1 IMPLANT
BNDG COHESIVE 6X5 TAN STRL LF (GAUZE/BANDAGES/DRESSINGS) ×2 IMPLANT
BNDG CONFORM 3 STRL LF (GAUZE/BANDAGES/DRESSINGS) ×2 IMPLANT
BNDG ESMARK 6X9 LF (GAUZE/BANDAGES/DRESSINGS) ×2
CANISTER SUCT 3000ML PPV (MISCELLANEOUS) ×2 IMPLANT
CHLORAPREP W/TINT 26 (MISCELLANEOUS) ×2 IMPLANT
COVER SURGICAL LIGHT HANDLE (MISCELLANEOUS) ×2 IMPLANT
COVER WAND RF STERILE (DRAPES) ×1 IMPLANT
CUFF TOURN SGL QUICK 34 (TOURNIQUET CUFF) ×2
CUFF TOURN SGL QUICK 42 (TOURNIQUET CUFF) ×1 IMPLANT
CUFF TRNQT CYL 34X4.125X (TOURNIQUET CUFF) ×1 IMPLANT
DRSG MEPILEX BORDER 4X8 (GAUZE/BANDAGES/DRESSINGS) ×1 IMPLANT
DRSG MEPITEL 4X7.2 (GAUZE/BANDAGES/DRESSINGS) ×2 IMPLANT
DRSG PAD ABDOMINAL 8X10 ST (GAUZE/BANDAGES/DRESSINGS) IMPLANT
ELECT REM PT RETURN 9FT ADLT (ELECTROSURGICAL) ×2
ELECTRODE REM PT RTRN 9FT ADLT (ELECTROSURGICAL) ×1 IMPLANT
EVACUATOR SILICONE 100CC (DRAIN) IMPLANT
GAUZE SPONGE 4X4 12PLY STRL (GAUZE/BANDAGES/DRESSINGS) IMPLANT
GAUZE SPONGE 4X4 16PLY XRAY LF (GAUZE/BANDAGES/DRESSINGS) ×1 IMPLANT
GLOVE BIO SURGEON STRL SZ8 (GLOVE) ×2 IMPLANT
GLOVE BIOGEL PI IND STRL 8 (GLOVE) ×2 IMPLANT
GLOVE BIOGEL PI INDICATOR 8 (GLOVE) ×2
GLOVE ECLIPSE 8.0 STRL XLNG CF (GLOVE) ×2 IMPLANT
GOWN STRL REUS W/ TWL XL LVL3 (GOWN DISPOSABLE) ×2 IMPLANT
GOWN STRL REUS W/TWL XL LVL3 (GOWN DISPOSABLE) ×4
KIT BASIN OR (CUSTOM PROCEDURE TRAY) ×2 IMPLANT
KIT STIMULAN 5CC (Orthopedic Implant) ×1 IMPLANT
KIT TURNOVER KIT B (KITS) ×2 IMPLANT
NS IRRIG 1000ML POUR BTL (IV SOLUTION) ×2 IMPLANT
PACK ORTHO EXTREMITY (CUSTOM PROCEDURE TRAY) ×2 IMPLANT
PAD ABD 7.5X8 STRL (GAUZE/BANDAGES/DRESSINGS) ×1 IMPLANT
PAD ARMBOARD 7.5X6 YLW CONV (MISCELLANEOUS) ×4 IMPLANT
PAD CAST 4YDX4 CTTN HI CHSV (CAST SUPPLIES) IMPLANT
PADDING CAST ABS 6INX4YD NS (CAST SUPPLIES) ×1
PADDING CAST ABS COTTON 6X4 NS (CAST SUPPLIES) IMPLANT
PADDING CAST COTTON 4X4 STRL (CAST SUPPLIES)
SET CYSTO W/LG BORE CLAMP LF (SET/KITS/TRAYS/PACK) ×4 IMPLANT
SPLINT PLASTER CAST XFAST 5X30 (CAST SUPPLIES) IMPLANT
SPLINT PLASTER XFAST SET 5X30 (CAST SUPPLIES) ×1
SPONGE LAP 4X18 RFD (DISPOSABLE) ×2 IMPLANT
SUCTION FRAZIER HANDLE 10FR (MISCELLANEOUS) ×2
SUCTION TUBE FRAZIER 10FR DISP (MISCELLANEOUS) ×1 IMPLANT
SUT ETHILON 2 0 FS 18 (SUTURE) ×2 IMPLANT
SUT ETHILON 3 0 PS 1 (SUTURE) IMPLANT
SUT VIC AB 2-0 CT1 27 (SUTURE)
SUT VIC AB 2-0 CT1 TAPERPNT 27 (SUTURE) IMPLANT
TOWEL GREEN STERILE (TOWEL DISPOSABLE) ×2 IMPLANT
TOWEL GREEN STERILE FF (TOWEL DISPOSABLE) ×2 IMPLANT
TUBE CONNECTING 12X1/4 (SUCTIONS) ×2 IMPLANT
YANKAUER SUCT BULB TIP NO VENT (SUCTIONS) ×2 IMPLANT

## 2020-04-18 NOTE — Anesthesia Procedure Notes (Signed)
Procedure Name: LMA Insertion Date/Time: 04/18/2020 7:45 AM Performed by: Shireen Quan, CRNA Pre-anesthesia Checklist: Patient identified, Emergency Drugs available, Suction available and Patient being monitored Patient Re-evaluated:Patient Re-evaluated prior to induction Oxygen Delivery Method: Circle System Utilized Preoxygenation: Pre-oxygenation with 100% oxygen Induction Type: IV induction Ventilation: Mask ventilation without difficulty LMA: LMA inserted LMA Size: 4.0 Number of attempts: 1 Placement Confirmation: positive ETCO2 Tube secured with: Tape Dental Injury: Teeth and Oropharynx as per pre-operative assessment

## 2020-04-18 NOTE — Plan of Care (Signed)
  Problem: Activity: Goal: Risk for activity intolerance will decrease Outcome: Progressing   Problem: Coping: Goal: Level of anxiety will decrease Outcome: Progressing   Problem: Pain Managment: Goal: General experience of comfort will improve Outcome: Progressing   Problem: Safety: Goal: Ability to remain free from injury will improve Outcome: Progressing   Problem: Skin Integrity: Goal: Risk for impaired skin integrity will decrease Outcome: Progressing   

## 2020-04-18 NOTE — Progress Notes (Signed)
PHARMACY CONSULT NOTE FOR:  OUTPATIENT  PARENTERAL ANTIBIOTIC THERAPY (OPAT)  Indication: Osteomyelitis Regimen: Daptomycin 500mg  once daily x 6 weeks End date: 05/30/20  IV antibiotic discharge orders are pended. To discharging provider:  please sign these orders via discharge navigator,  Select New Orders & click on the button choice - Manage This Unsigned Work.     Thank you for allowing pharmacy to be a part of this patient's care.  07/30/20 04/18/2020, 12:05 PM

## 2020-04-18 NOTE — Plan of Care (Signed)
  Problem: Education: Goal: Knowledge of General Education information will improve Description: Including pain rating scale, medication(s)/side effects and non-pharmacologic comfort measures Outcome: Progressing   Problem: Health Behavior/Discharge Planning: Goal: Ability to manage health-related needs will improve Outcome: Progressing   Problem: Activity: Goal: Risk for activity intolerance will decrease Outcome: Progressing   Problem: Elimination: Goal: Will not experience complications related to bowel motility Outcome: Progressing   Problem: Pain Managment: Goal: General experience of comfort will improve Outcome: Progressing   
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
## 2020-04-18 NOTE — Anesthesia Postprocedure Evaluation (Signed)
Anesthesia Post Note  Patient: Jeremy Hancock  Procedure(s) Performed: Repeat irrigation and debridement right foot abcess (Right )     Patient location during evaluation: PACU Anesthesia Type: General Level of consciousness: awake and alert and oriented Pain management: pain level controlled Vital Signs Assessment: post-procedure vital signs reviewed and stable Respiratory status: spontaneous breathing, nonlabored ventilation and respiratory function stable Cardiovascular status: blood pressure returned to baseline Postop Assessment: no apparent nausea or vomiting Anesthetic complications: no   No complications documented.  Last Vitals:  Vitals:   04/18/20 0851 04/18/20 0908  BP: 114/73 (!) 107/59  Pulse: (!) 58 63  Resp: 10 12  Temp:  (!) 36.1 C  SpO2: 100% 97%                Kaylyn Layer

## 2020-04-18 NOTE — Transfer of Care (Signed)
Immediate Anesthesia Transfer of Care Note  Patient: Jeremy Hancock  Procedure(s) Performed: Repeat irrigation and debridement right foot abcess (Right )  Patient Location: PACU  Anesthesia Type:General  Level of Consciousness: awake and oriented  Airway & Oxygen Therapy: Patient Spontanous Breathing and Patient connected to nasal cannula oxygen  Post-op Assessment: Report given to RN, Post -op Vital signs reviewed and stable and Patient moving all extremities  Post vital signs: Reviewed and stable  Last Vitals:  Vitals Value Taken Time  BP 123/83 04/18/20 0836  Temp    Pulse 70 04/18/20 0838  Resp 19 04/18/20 0838  SpO2 100 % 04/18/20 0838  Vitals shown include unvalidated device data.  Last Pain:  Vitals:   04/18/20 2836  TempSrc: Oral  PainSc: 8       Patients Stated Pain Goal: 0 (04/17/20 2349)  Complications: No complications documented.

## 2020-04-18 NOTE — Progress Notes (Signed)
[facility]    Date of Admission:  04/13/2020   Total days of antibiotics 5           ID: Jeremy Hancock is a 50 y.o. male with  MRSA calcaneal osteomyelitis Active Problems:   Chronic osteomyelitis of right foot ([facility])    Subjective: Had debridement surgery and did well, looking forward to going home. Slept poorly overnight  OR NOTE: 1.  Irrigation and excisional debridement of right lateral hindfoot abscess 2.  Right calcaneus sequestrectomy  ROS: no fever,chills, nightsweats, no N/V/D. No rash with abtx  Medications:  . Chlorhexidine Gluconate Cloth  6 each Topical Daily  . docusate sodium  100 mg Oral BID  . feeding supplement  237 mL Oral BID BM  . gabapentin  600 mg Oral QHS  . multivitamin with minerals  1 tablet Oral Daily  . mupirocin ointment  1 application Nasal BID  . senna  1 tablet Oral BID  . sodium chloride flush  10-40 mL Intracatheter Q12H    Objective: Vital signs in last 24 hours: Temp:  [97 F (36.1 C)-97.8 F (36.6 C)] 97.8 F (36.6 C) (01/18 1555) Pulse Rate:  [51-72] 72 (01/18 1555) Resp:  [10-17] 17 (01/18 1555) BP: (99-123)/(59-83) 111/77 (01/18 1555) SpO2:  [95 %-100 %] 95 % (01/18 1555) Physical Exam  Constitutional: He is oriented to person, place, and time. He appears well-developed and well-nourished. No distress.  HENT:  Mouth/Throat: Oropharynx is clear and moist. No oropharyngeal exudate.  Cardiovascular: Normal rate, regular rhythm and normal heart sounds. Exam reveals no gallop and no friction rub.  No murmur heard.  Pulmonary/Chest: Effort normal and breath sounds normal. No respiratory distress. He has no wheezes.  Abdominal: Soft. Bowel sounds are normal. He exhibits no distension. There is no tenderness.  MLY:YTKPT foot is in cast Neurological: He is alert and oriented to person, place, and time.  Skin: Skin is warm and dry. No rash noted. No erythema.  Psychiatric: He has a normal mood and  affect. His behavior is normal.     Lab Results Recent Labs    04/17/20 0322  NA 139  K 3.6  CL 99  CO2 28  BUN 11  CREATININE 0.97   Lab Results  Component Value Date   ESRSEDRATE 71 (H) 04/13/2020   Lab Results  Component Value Date   CRP 21.0 (H) 04/13/2020    Microbiology: Methicillin resistant staphylococcus aureus      MIC    CIPROFLOXACIN >=8 RESISTANT  Resistant    CLINDAMYCIN <=0.25 SENS... Sensitive    ERYTHROMYCIN <=0.25 SENS... Sensitive    GENTAMICIN <=0.5 SENSI... Sensitive    Inducible Clindamycin NEGATIVE  Sensitive    OXACILLIN >=4 RESISTANT  Resistant    RIFAMPIN <=0.5 SENSI... Sensitive    TETRACYCLINE <=1 SENSITIVE  Sensitive    TRIMETH/SULFA <=10 SENSIT... Sensitive    VANCOMYCIN 1 SENSITIVE  Sensitive     Studies/Results: No results found.   Assessment/Plan: MRSA calcaneal osteomyelitis = will plan for minimum of 6 wk of iv daptomycin at 31m/kg daily infusion  Please see home health orders below. Will plan to see back in the ID clinic in 4-6 wk  Diagnosis: MRSA calcaneal osteomyelitis  Culture Result: MRSA  Allergies  Allergen Reactions  . Amoxicillin Other (See Comments)    Severe abd pain Did it involve swelling of the face/tongue/throat, SOB, or low BP? No Did it involve sudden or severe rash/hives, skin  peeling, or any reaction on the inside of your mouth or nose? No Did you need to seek medical attention at a hospital or doctor's office? No When did it last happen?5 + years If all above answers are "NO", may proceed with cephalosporin use.   . Codeine     Hot flashes but oxycodone is fine  . Hydrocodone-Acetaminophen     Hot flashes  . Morphine And Related     Hot flashes  . Statins Other (See Comments)    Severe myalgias  . Zyrtec [Cetirizine] Other (See Comments)    Bloody stool    OPAT Orders Discharge antibiotics to be given via PICC line Discharge antibiotics: Per pharmacy protocol  daptomycin  74m/kg  Duration: 6 wk End Date: MArch 1st  2022  PHardingPer Protocol:  Home health RN for IV administration and teaching; PICC line care and labs.    Labs weekly while on IV antibiotics: _x_ CBC with differential _x_ BMP _x_ CRP _x_ ESR  _x_ CK  _x_ Please pull PIC at completion of IV antibiotics   Fax weekly labs to [phone] Clinic Follow Up Appt: 4- 6wk  @ [email or web address] Cell: [phone]: [phone]  04/18/2020, 4:33 PM
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
## 2020-04-18 NOTE — Interval H&P Note (Signed)
History and Physical Interval Note:  04/18/2020 7:34 AM  Jeremy Hancock  has presented today for surgery, with the diagnosis of right foot osteomyelitis.  The various methods of treatment have been discussed with the patient and family. After consideration of risks, benefits and other options for treatment, the patient has consented to  Procedure(s) with comments: Repeat irrigation and debridement right foot abcess (Right) - as a surgical intervention.  The patient's history has been reviewed, patient examined, no change in status, stable for surgery.  I have reviewed the patient's chart and labs.  Questions were answered to the patient's satisfaction.    The risks and benefits of the alternative treatment options have been discussed in detail.  The patient wishes to proceed with surgery and specifically understands risks of bleeding, infection, nerve damage, blood clots, need for additional surgery, amputation and death.   Toni Arthurs

## 2020-04-19 ENCOUNTER — Encounter (HOSPITAL_COMMUNITY): Payer: Self-pay | Admitting: Orthopedic Surgery

## 2020-04-19 LAB — AEROBIC/ANAEROBIC CULTURE W GRAM STAIN (SURGICAL/DEEP WOUND)

## 2020-04-19 LAB — CK: Total CK: 30 U/L — ABNORMAL LOW (ref 49–397)

## 2020-04-19 MED ORDER — DOCUSATE SODIUM 100 MG PO CAPS: 100.0000 mg | ORAL_CAPSULE | Freq: Two times a day (BID) | ORAL | 0 refills | Status: DC

## 2020-04-19 MED ORDER — ASPIRIN EC 81 MG PO TBEC: 81.0000 mg | DELAYED_RELEASE_TABLET | Freq: Two times a day (BID) | ORAL | 0 refills | Status: DC

## 2020-04-19 MED ORDER — DAPTOMYCIN IV (FOR PTA / DISCHARGE USE ONLY): 500.0000 mg | INTRAVENOUS | 0 refills | Status: AC

## 2020-04-19 MED ORDER — SODIUM CHLORIDE 0.9 % IV SOLN
500.0000 mg | Freq: Every day | INTRAVENOUS | Status: DC
  Administered 2020-04-19: 500 mg via INTRAVENOUS
  Filled 2020-04-19 (×2): qty 10

## 2020-04-19 MED ORDER — SENNA 8.6 MG PO TABS: 2.0000 | ORAL_TABLET | Freq: Two times a day (BID) | ORAL | 0 refills | Status: DC

## 2020-04-19 MED ORDER — OXYCODONE HCL 5 MG PO TABS: 5.0000 mg | ORAL_TABLET | ORAL | 0 refills | Status: DC | PRN

## 2020-04-19 NOTE — Care Management Important Message (Signed)
Important Message  Patient Details  Name: Jeremy Hancock MRN: [id number] Date of Birth: 03-Oct-1970   Medicare Important Message Given:  Yes - Important Message mailed due to current National Emergency   Verbal consent obtained due to current National Emergency  Relationship to patient: Self Contact Name: Chad Donoghue Call Date: 04/19/20  Time: 1356 Phone: [phone] Outcome: No Answer/Busy Important Message mailed to: Patient address on file    Orson Aloe 04/19/2020, 1:56 PM
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
## 2020-04-19 NOTE — Progress Notes (Signed)
Subjective: 1 Day Post-Op Procedure(s) (LRB): Repeat irrigation and debridement right foot abcess (Right)  Patient reports pain as mild to moderate.  Denies fever, chills, N/V, CP, SOB.  Tolerating POs well.  Reports that he is ready to go home.  Objective:   VITALS:  Temp:  [97 F (36.1 C)-98.1 F (36.7 C)] 97.9 F (36.6 C) (01/19 0507) Pulse Rate:  [56-73] 56 (01/19 0507) Resp:  [10-17] 16 (01/19 0507) BP: (107-123)/(59-83) 108/75 (01/19 0507) SpO2:  [95 %-100 %] 97 % (01/19 0507) Weight:  [66.6 kg] 66.6 kg (01/19 0507)  General: WDWN patient in NAD. Psych:  Appropriate mood and affect. Neuro:  A&O x 3, Moving all extremities, sensation intact to light touch HEENT:  EOMs intact Chest:  Even non-labored respirations Skin: SLS C/D/I, no rashes or lesions Extremities: warm/dry, no visible edema, erythema or echymosis.  No lymphadenopathy. Pulses: Popliteus 2+ MSK:  ROM: EHL/FHL intact, MMT: able to perform quad set    LABS No results for input(s): HGB, WBC, PLT in the last 72 hours. Recent Labs    04/17/20 0322  NA 139  K 3.6  CL 99  CO2 28  BUN 11  CREATININE 0.97  GLUCOSE 125*   No results for input(s): LABPT, INR in the last 72 hours.   Assessment/Plan: 1 Day Post-Op Procedure(s) (LRB): Repeat irrigation and debridement right foot abcess (Right)  NWB R LE D/C home IV daptomycin x 6 weeks per ID recommendation D/C scripts sent to patient's pharmacy. After searching [place] PMP Aware the patient is provided a Rx for oxycodone. Plan for 2 week outpatient post-op visit with Dr. Victorino Dike.  Alfredo Martinez PA-C [facility] Office:  [phone]

## 2020-04-19 NOTE — TOC Transition Note (Signed)
Transition of Care [facility]) - CM/SW Discharge Note   Patient Details  Name: OCTAVE MONTROSE MRN: [id number] Date of Birth: Aug 12, 1970  Transition of Care [facility]) CM/SW Contact:  Deatra Robinson, [place] Phone Number: 04/19/2020, 1:39 PM   Clinical Narrative:   Pt for dc home with home IV ABX through [facility]. TOC unable to secure [facility] due to insurance barrier. Per Pam with [facility], they will supply their own [facility] through [facility] to f/u on teaching and monitor PICC, ABX administration. Pt provided with teaching by Pam with [facility] in room today.   Dellie Burns, MSW, LCSW [phone] (coverage)       Final next level of care: Home w Home Health Services Barriers to Discharge: No Barriers Identified   Patient Goals and CMS Choice Patient states their goals for this hospitalization and ongoing recovery are:: to get better and go home CMS Medicare.gov Compare Post Acute Care list provided to:: Patient Choice offered to / list presented to : Patient  Discharge Placement                    Patient and family notified of of transfer: 04/19/20  Discharge Plan and Services   Discharge Planning Services: CM Consult Post Acute Care Choice: Home Health            DME Agency: Other - Comment ([facility]) Date DME Agency Contacted: 04/17/20 Time DME Agency Contacted: 1058 Representative spoke with at DME Agency: Elita Quick HH Arranged: RN HH Agency: [facility] Date HH Agency Contacted: 04/19/20 Time HH Agency Contacted: 1330 Representative spoke with at [facility] Agency: Pam  Social Determinants of Health (SDOH) Interventions     Readmission Risk Interventions No flowsheet data found.

## 2020-04-19 NOTE — Plan of Care (Signed)
Pt educated on DC instructions and medications reviewed. Pt verbalized understanding and no concerns noted at this time, vitals WNL. Pt awaiting home health education and ride for DC. Will be escorted to lobby by staff.

## 2020-04-20 DIAGNOSIS — M86171 Other acute osteomyelitis, right ankle and foot: Secondary | ICD-10-CM | POA: Diagnosis not present

## 2020-04-21 DIAGNOSIS — M86171 Other acute osteomyelitis, right ankle and foot: Secondary | ICD-10-CM | POA: Diagnosis not present

## 2020-04-22 DIAGNOSIS — M86171 Other acute osteomyelitis, right ankle and foot: Secondary | ICD-10-CM | POA: Diagnosis not present

## 2020-04-23 DIAGNOSIS — M86171 Other acute osteomyelitis, right ankle and foot: Secondary | ICD-10-CM | POA: Diagnosis not present

## 2020-04-24 DIAGNOSIS — M86171 Other acute osteomyelitis, right ankle and foot: Secondary | ICD-10-CM | POA: Diagnosis not present

## 2020-04-25 DIAGNOSIS — M869 Osteomyelitis, unspecified: Secondary | ICD-10-CM | POA: Diagnosis not present

## 2020-04-25 DIAGNOSIS — M86171 Other acute osteomyelitis, right ankle and foot: Secondary | ICD-10-CM | POA: Diagnosis not present

## 2020-04-26 DIAGNOSIS — M86171 Other acute osteomyelitis, right ankle and foot: Secondary | ICD-10-CM | POA: Diagnosis not present

## 2020-04-27 DIAGNOSIS — M86171 Other acute osteomyelitis, right ankle and foot: Secondary | ICD-10-CM | POA: Diagnosis not present

## 2020-04-28 DIAGNOSIS — M86171 Other acute osteomyelitis, right ankle and foot: Secondary | ICD-10-CM | POA: Diagnosis not present

## 2020-04-29 DIAGNOSIS — M86171 Other acute osteomyelitis, right ankle and foot: Secondary | ICD-10-CM | POA: Diagnosis not present

## 2020-04-30 DIAGNOSIS — M86171 Other acute osteomyelitis, right ankle and foot: Secondary | ICD-10-CM | POA: Diagnosis not present

## 2020-05-01 DIAGNOSIS — M86171 Other acute osteomyelitis, right ankle and foot: Secondary | ICD-10-CM | POA: Diagnosis not present

## 2020-05-02 DIAGNOSIS — M86171 Other acute osteomyelitis, right ankle and foot: Secondary | ICD-10-CM | POA: Diagnosis not present

## 2020-05-02 DIAGNOSIS — M869 Osteomyelitis, unspecified: Secondary | ICD-10-CM | POA: Diagnosis not present

## 2020-05-03 DIAGNOSIS — M86171 Other acute osteomyelitis, right ankle and foot: Secondary | ICD-10-CM | POA: Diagnosis not present

## 2020-05-04 DIAGNOSIS — M86171 Other acute osteomyelitis, right ankle and foot: Secondary | ICD-10-CM | POA: Diagnosis not present

## 2020-05-05 DIAGNOSIS — M86171 Other acute osteomyelitis, right ankle and foot: Secondary | ICD-10-CM | POA: Diagnosis not present

## 2020-05-06 DIAGNOSIS — M86171 Other acute osteomyelitis, right ankle and foot: Secondary | ICD-10-CM | POA: Diagnosis not present

## 2020-05-07 DIAGNOSIS — M86171 Other acute osteomyelitis, right ankle and foot: Secondary | ICD-10-CM | POA: Diagnosis not present

## 2020-05-08 DIAGNOSIS — M86171 Other acute osteomyelitis, right ankle and foot: Secondary | ICD-10-CM | POA: Diagnosis not present

## 2020-05-09 DIAGNOSIS — M86171 Other acute osteomyelitis, right ankle and foot: Secondary | ICD-10-CM | POA: Diagnosis not present

## 2020-05-09 DIAGNOSIS — M869 Osteomyelitis, unspecified: Secondary | ICD-10-CM | POA: Diagnosis not present

## 2020-05-10 DIAGNOSIS — M86171 Other acute osteomyelitis, right ankle and foot: Secondary | ICD-10-CM | POA: Diagnosis not present

## 2020-05-11 DIAGNOSIS — M86171 Other acute osteomyelitis, right ankle and foot: Secondary | ICD-10-CM | POA: Diagnosis not present

## 2020-05-12 DIAGNOSIS — M86171 Other acute osteomyelitis, right ankle and foot: Secondary | ICD-10-CM | POA: Diagnosis not present

## 2020-05-12 LAB — FUNGAL ORGANISM REFLEX

## 2020-05-12 LAB — FUNGUS CULTURE WITH STAIN

## 2020-05-12 LAB — FUNGUS CULTURE RESULT

## 2020-05-13 DIAGNOSIS — M86171 Other acute osteomyelitis, right ankle and foot: Secondary | ICD-10-CM | POA: Diagnosis not present

## 2020-05-14 DIAGNOSIS — M86171 Other acute osteomyelitis, right ankle and foot: Secondary | ICD-10-CM | POA: Diagnosis not present

## 2020-05-15 DIAGNOSIS — M86171 Other acute osteomyelitis, right ankle and foot: Secondary | ICD-10-CM | POA: Diagnosis not present

## 2020-05-15 DIAGNOSIS — M869 Osteomyelitis, unspecified: Secondary | ICD-10-CM | POA: Diagnosis not present

## 2020-05-16 DIAGNOSIS — M86171 Other acute osteomyelitis, right ankle and foot: Secondary | ICD-10-CM | POA: Diagnosis not present

## 2020-05-17 DIAGNOSIS — M86171 Other acute osteomyelitis, right ankle and foot: Secondary | ICD-10-CM | POA: Diagnosis not present

## 2020-05-18 DIAGNOSIS — M86171 Other acute osteomyelitis, right ankle and foot: Secondary | ICD-10-CM | POA: Diagnosis not present

## 2020-05-19 DIAGNOSIS — M86171 Other acute osteomyelitis, right ankle and foot: Secondary | ICD-10-CM | POA: Diagnosis not present

## 2020-05-20 DIAGNOSIS — M86171 Other acute osteomyelitis, right ankle and foot: Secondary | ICD-10-CM | POA: Diagnosis not present

## 2020-05-21 DIAGNOSIS — M86171 Other acute osteomyelitis, right ankle and foot: Secondary | ICD-10-CM | POA: Diagnosis not present

## 2020-05-22 DIAGNOSIS — M86171 Other acute osteomyelitis, right ankle and foot: Secondary | ICD-10-CM | POA: Diagnosis not present

## 2020-05-23 DIAGNOSIS — M86171 Other acute osteomyelitis, right ankle and foot: Secondary | ICD-10-CM | POA: Diagnosis not present

## 2020-05-23 DIAGNOSIS — M869 Osteomyelitis, unspecified: Secondary | ICD-10-CM | POA: Diagnosis not present

## 2020-05-24 DIAGNOSIS — M86171 Other acute osteomyelitis, right ankle and foot: Secondary | ICD-10-CM | POA: Diagnosis not present

## 2020-05-25 DIAGNOSIS — M86171 Other acute osteomyelitis, right ankle and foot: Secondary | ICD-10-CM | POA: Diagnosis not present

## 2020-05-26 DIAGNOSIS — M86171 Other acute osteomyelitis, right ankle and foot: Secondary | ICD-10-CM | POA: Diagnosis not present

## 2020-05-27 DIAGNOSIS — M86171 Other acute osteomyelitis, right ankle and foot: Secondary | ICD-10-CM | POA: Diagnosis not present

## 2020-05-28 DIAGNOSIS — M86171 Other acute osteomyelitis, right ankle and foot: Secondary | ICD-10-CM | POA: Diagnosis not present

## 2020-05-29 DIAGNOSIS — M86171 Other acute osteomyelitis, right ankle and foot: Secondary | ICD-10-CM | POA: Diagnosis not present

## 2020-05-30 ENCOUNTER — Telehealth: Payer: Self-pay

## 2020-05-30 DIAGNOSIS — M86171 Other acute osteomyelitis, right ankle and foot: Secondary | ICD-10-CM | POA: Diagnosis not present

## 2020-05-30 DIAGNOSIS — M869 Osteomyelitis, unspecified: Secondary | ICD-10-CM | POA: Diagnosis not present

## 2020-05-30 NOTE — Telephone Encounter (Signed)
Per MD okay for patient to have his picc pulled. Relayed orders to [facility] who will update nursing.

## 2020-05-30 NOTE — Telephone Encounter (Signed)
Received call today from [facility] requesting pull picc orders. Patient has not been seen in office and is scheduled to see Dr. Luciana Axe on 3/4.  Per hospital note okay to pull picc after completion of antibiotics.  Will confirm with Md before relaying orders.

## 2020-06-02 ENCOUNTER — Ambulatory Visit: Payer: Medicare HMO | Admitting: Internal Medicine

## 2020-06-02 ENCOUNTER — Telehealth: Payer: Self-pay

## 2020-06-02 NOTE — Telephone Encounter (Signed)
Attempted to call patient regarding todays appointment with Dr. Luciana Axe. Left voicemail requesting patient call back to reschedule appointment.

## 2020-06-22 ENCOUNTER — Other Ambulatory Visit: Payer: Self-pay

## 2020-06-22 ENCOUNTER — Encounter: Payer: Self-pay | Admitting: Internal Medicine

## 2020-06-22 ENCOUNTER — Ambulatory Visit (INDEPENDENT_AMBULATORY_CARE_PROVIDER_SITE_OTHER): Payer: Medicare HMO | Admitting: Internal Medicine

## 2020-06-22 VITALS — Ht 65.0 in | Wt 144.0 lb

## 2020-06-22 DIAGNOSIS — Z452 Encounter for adjustment and management of vascular access device: Secondary | ICD-10-CM | POA: Diagnosis not present

## 2020-06-22 DIAGNOSIS — M86671 Other chronic osteomyelitis, right ankle and foot: Secondary | ICD-10-CM

## 2020-06-22 DIAGNOSIS — A4902 Methicillin resistant Staphylococcus aureus infection, unspecified site: Secondary | ICD-10-CM

## 2020-06-22 NOTE — Assessment & Plan Note (Signed)
This has been removed by home health and the area on his arm examined, no issues.

## 2020-06-22 NOTE — Assessment & Plan Note (Signed)
He is now s/p treatment and doing well.  No signs for active infection at this time and hopefull for complete recovery.  Hopefully the final opening will be able to close soon and he is continuing to follow with Dr. Victorino Dike.  I will check inflammatory markers and if there are no significant concerns, no further antibiotics indicated at this time and he will return as needed.

## 2020-06-22 NOTE — Assessment & Plan Note (Signed)
As above, osteomyelitis and appears resolved

## 2020-06-22 NOTE — Progress Notes (Signed)
   Subjective:    Patient ID: Jeremy Hancock, male    DOB: 05-16-1970, 50 y.o.   MRN: [id number]  HPI He is here for follow up of osteomyelitis.   He has a history of right calcaneus osteomyelitis with a history of right ankle ORIF remotely in 2007 with an external fixator at that time and debrided by Dr. Victorino Dike in January.  Culture grew MRSA and he completed 6 weeks of IV daptomycin following the surgery.  He is here for follow up after missing previous follow up earlier this month.  He is doing overall well with no new pain, no fever, no chills.  He still has an open area at the site of the incision with serosanguinous drainage.  No complaints otherwise.    Review of Systems  Constitutional: Negative for fever.  Gastrointestinal: Negative for diarrhea and nausea.  Skin: Negative for rash.       Objective:   Physical Exam Eyes:     General: No scleral icterus. Cardiovascular:     Rate and Rhythm: Normal rate.  Musculoskeletal:     Comments: Small area, 3-4 mm with serosanguinous drainage.  No surrounding erythema, no pain or swelling.    Neurological:     Mental Status: He is alert.  Psychiatric:        Mood and Affect: Mood normal.    SH: + tobacco      Assessment & Plan:

## 2020-06-23 ENCOUNTER — Telehealth: Payer: Self-pay

## 2020-06-23 LAB — CBC WITH DIFFERENTIAL/PLATELET
Absolute Monocytes: 530 cells/uL (ref 200–950)
Basophils Absolute: 74 cells/uL (ref 0–200)
Basophils Relative: 0.8 %
Eosinophils Absolute: 409 cells/uL (ref 15–500)
Eosinophils Relative: 4.4 %
HCT: 47 % (ref 38.5–50.0)
Hemoglobin: 15.9 g/dL (ref 13.2–17.1)
Lymphs Abs: 1907 cells/uL (ref 850–3900)
MCH: 29.7 pg (ref 27.0–33.0)
MCHC: 33.8 g/dL (ref 32.0–36.0)
MCV: 87.7 fL (ref 80.0–100.0)
MPV: 9.6 fL (ref 7.5–12.5)
Monocytes Relative: 5.7 %
Neutro Abs: 6380 cells/uL (ref 1500–7800)
Neutrophils Relative %: 68.6 %
Platelets: 280 10*3/uL (ref 140–400)
RBC: 5.36 10*6/uL (ref 4.20–5.80)
RDW: 14.9 % (ref 11.0–15.0)
Total Lymphocyte: 20.5 %
WBC: 9.3 10*3/uL (ref 3.8–10.8)

## 2020-06-23 LAB — SEDIMENTATION RATE: Sed Rate: 6 mm/h (ref 0–15)

## 2020-06-23 LAB — C-REACTIVE PROTEIN: CRP: 4.5 mg/L (ref <8.0)

## 2020-06-23 NOTE — Telephone Encounter (Signed)
-----   Message from Gardiner Barefoot, MD sent at 06/23/2020 11:14 AM EDT ----- Please let him know that his labs look great, inflammatory markers normal and wBC normal.  No signs of infection at this time.  thanks

## 2020-06-23 NOTE — Telephone Encounter (Signed)
Patient notified of test results. No further questions or concerns at this time.   Annalysia Willenbring Loyola Mast, RN

## 2020-08-21 DIAGNOSIS — T1501XA Foreign body in cornea, right eye, initial encounter: Secondary | ICD-10-CM | POA: Diagnosis not present

## 2020-10-06 ENCOUNTER — Ambulatory Visit (INDEPENDENT_AMBULATORY_CARE_PROVIDER_SITE_OTHER): Payer: Medicare HMO | Admitting: Nurse Practitioner

## 2020-10-06 ENCOUNTER — Other Ambulatory Visit: Payer: Self-pay

## 2020-10-06 ENCOUNTER — Encounter: Payer: Self-pay | Admitting: Nurse Practitioner

## 2020-10-06 VITALS — BP 112/70 | HR 83 | Temp 98.3°F | Ht 65.0 in | Wt 141.6 lb

## 2020-10-06 DIAGNOSIS — R21 Rash and other nonspecific skin eruption: Secondary | ICD-10-CM

## 2020-10-06 MED ORDER — PREDNISONE 10 MG PO TABS: ORAL_TABLET | ORAL | 0 refills | Status: AC

## 2020-10-06 MED ORDER — METHYLPREDNISOLONE SODIUM SUCC 40 MG IJ SOLR: 40.0000 mg | Freq: Once | INTRAMUSCULAR | Status: DC
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
## 2020-11-08 ENCOUNTER — Encounter: Payer: Self-pay | Admitting: Nurse Practitioner

## 2020-11-08 ENCOUNTER — Ambulatory Visit (INDEPENDENT_AMBULATORY_CARE_PROVIDER_SITE_OTHER): Payer: Medicare HMO | Admitting: Nurse Practitioner

## 2020-11-08 ENCOUNTER — Other Ambulatory Visit: Payer: Self-pay

## 2020-11-08 VITALS — BP 104/72 | HR 81 | Temp 99.1°F | Ht 65.0 in | Wt 140.2 lb

## 2020-11-08 DIAGNOSIS — F17200 Nicotine dependence, unspecified, uncomplicated: Secondary | ICD-10-CM

## 2020-11-08 DIAGNOSIS — Z136 Encounter for screening for cardiovascular disorders: Secondary | ICD-10-CM | POA: Diagnosis not present

## 2020-11-08 DIAGNOSIS — Z131 Encounter for screening for diabetes mellitus: Secondary | ICD-10-CM

## 2020-11-08 DIAGNOSIS — Z720 Tobacco use: Secondary | ICD-10-CM | POA: Diagnosis not present

## 2020-11-08 DIAGNOSIS — Z130 Encounter for screening for diseases of the blood and blood-forming organs and certain disorders involving the immune mechanism: Secondary | ICD-10-CM

## 2020-11-08 DIAGNOSIS — Z13220 Encounter for screening for lipoid disorders: Secondary | ICD-10-CM

## 2020-11-08 DIAGNOSIS — R739 Hyperglycemia, unspecified: Secondary | ICD-10-CM | POA: Diagnosis not present

## 2020-11-08 DIAGNOSIS — H65113 Acute and subacute allergic otitis media (mucoid) (sanguinous) (serous), bilateral: Secondary | ICD-10-CM

## 2020-11-08 DIAGNOSIS — R69 Illness, unspecified: Secondary | ICD-10-CM | POA: Diagnosis not present

## 2020-11-08 DIAGNOSIS — H938X2 Other specified disorders of left ear: Secondary | ICD-10-CM | POA: Diagnosis not present

## 2020-11-08 DIAGNOSIS — H65193 Other acute nonsuppurative otitis media, bilateral: Secondary | ICD-10-CM

## 2020-11-08 DIAGNOSIS — Z1211 Encounter for screening for malignant neoplasm of colon: Secondary | ICD-10-CM | POA: Diagnosis not present

## 2020-11-08 MED ORDER — CEFDINIR 300 MG PO CAPS: 600.0000 mg | ORAL_CAPSULE | Freq: Every day | ORAL | 0 refills | Status: AC

## 2020-11-09 LAB — COMPLETE METABOLIC PANEL WITH GFR
AG Ratio: 1.8 (calc) (ref 1.0–2.5)
ALT: 25 U/L (ref 9–46)
AST: 23 U/L (ref 10–40)
Albumin: 4.3 g/dL (ref 3.6–5.1)
Alkaline phosphatase (APISO): 79 U/L (ref 36–130)
BUN: 8 mg/dL (ref 7–25)
CO2: 30 mmol/L (ref 20–32)
Calcium: 9.2 mg/dL (ref 8.6–10.3)
Chloride: 105 mmol/L (ref 98–110)
Creat: 0.93 mg/dL (ref 0.60–1.29)
Globulin: 2.4 g/dL (calc) (ref 1.9–3.7)
Glucose, Bld: 80 mg/dL (ref 65–99)
Potassium: 4.3 mmol/L (ref 3.5–5.3)
Sodium: 141 mmol/L (ref 135–146)
Total Bilirubin: 0.4 mg/dL (ref 0.2–1.2)
Total Protein: 6.7 g/dL (ref 6.1–8.1)
eGFR: 101 mL/min/{1.73_m2} (ref >=60)

## 2020-11-09 LAB — LIPID PANEL
Cholesterol: 225 mg/dL — ABNORMAL HIGH (ref <200)
HDL: 32 mg/dL — ABNORMAL LOW (ref >=40)
LDL Cholesterol (Calc): 161 mg/dL (calc) — ABNORMAL HIGH
Non-HDL Cholesterol (Calc): 193 mg/dL (calc) — ABNORMAL HIGH (ref <130)
Total CHOL/HDL Ratio: 7 (calc) — ABNORMAL HIGH (ref <5.0)
Triglycerides: 175 mg/dL — ABNORMAL HIGH (ref <150)

## 2020-11-09 LAB — CBC WITH DIFFERENTIAL/PLATELET
Absolute Monocytes: 372 cells/uL (ref 200–950)
Basophils Absolute: 40 cells/uL (ref 0–200)
Basophils Relative: 1 %
Eosinophils Absolute: 220 cells/uL (ref 15–500)
Eosinophils Relative: 5.5 %
HCT: 47.2 % (ref 38.5–50.0)
Hemoglobin: 16.4 g/dL (ref 13.2–17.1)
Lymphs Abs: 1096 cells/uL (ref 850–3900)
MCH: 30.3 pg (ref 27.0–33.0)
MCHC: 34.7 g/dL (ref 32.0–36.0)
MCV: 87.2 fL (ref 80.0–100.0)
MPV: 9.5 fL (ref 7.5–12.5)
Monocytes Relative: 9.3 %
Neutro Abs: 2272 cells/uL (ref 1500–7800)
Neutrophils Relative %: 56.8 %
Platelets: 217 10*3/uL (ref 140–400)
RBC: 5.41 10*6/uL (ref 4.20–5.80)
RDW: 14.1 % (ref 11.0–15.0)
Total Lymphocyte: 27.4 %
WBC: 4 10*3/uL (ref 3.8–10.8)

## 2020-11-09 LAB — HEMOGLOBIN A1C
Hgb A1c MFr Bld: 5.6 % of total Hgb (ref <5.7)
Mean Plasma Glucose: 114 mg/dL
eAG (mmol/L): 6.3 mmol/L
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
## 2020-11-13 ENCOUNTER — Ambulatory Visit (INDEPENDENT_AMBULATORY_CARE_PROVIDER_SITE_OTHER): Payer: Medicare HMO | Admitting: Nurse Practitioner

## 2020-11-13 ENCOUNTER — Other Ambulatory Visit: Payer: Self-pay

## 2020-11-13 ENCOUNTER — Encounter: Payer: Self-pay | Admitting: Nurse Practitioner

## 2020-11-13 VITALS — BP 104/78 | HR 71 | Temp 98.3°F | Ht 65.0 in | Wt 143.0 lb

## 2020-11-13 DIAGNOSIS — H65113 Acute and subacute allergic otitis media (mucoid) (sanguinous) (serous), bilateral: Secondary | ICD-10-CM

## 2020-11-13 MED ORDER — CIPROFLOXACIN-DEXAMETHASONE 0.3-0.1 % OT SUSP: 4.0000 [drp] | Freq: Two times a day (BID) | OTIC | 0 refills | Status: AC

## 2020-11-16 ENCOUNTER — Telehealth: Payer: Self-pay | Admitting: Family Medicine

## 2020-11-16 NOTE — Telephone Encounter (Signed)
Called and talked with patient.  Reports left ear is back to normal, right ear now has clogged sensation.  No drainage from ear or ear pain.    I encouraged him to finish oral antibiotics to completion.  He should use ear drops previously prescribed for left ear in both ears.  Follow up early next week if ear is not improving and we can either get him in with hearing doctor or ENT.

## 2020-11-16 NOTE — Telephone Encounter (Signed)
Patient called to report his right ear is clogged; course of antibiotics will be finished in a couple of days. Patient wants to know if he needs to come in for another appointment or if provider will call in a refill of the antibiotics. Please advise at [phone].

## 2020-11-23 ENCOUNTER — Telehealth: Payer: Self-pay

## 2020-11-23 DIAGNOSIS — H938X3 Other specified disorders of ear, bilateral: Secondary | ICD-10-CM

## 2020-11-23 NOTE — Telephone Encounter (Signed)
Referral placed to ENT

## 2020-11-23 NOTE — Telephone Encounter (Signed)
Pt called in stating that he is still having issues with his ears and hearing. Pt states that has been about a month since he was seen and he has some relief but he is still having issue. Pt stated that he just wanted to speak with NP to get some advice as to what to do, or if he can get a referral to an ENT specialist if possible. Please advise.  Cb#: [phone]

## 2020-11-24 ENCOUNTER — Telehealth: Payer: Self-pay | Admitting: Family Medicine

## 2020-11-24 NOTE — Telephone Encounter (Signed)
Patient requesting referral be sent to Dr. Annalee Genta who performed his sinus surgery.

## 2020-11-24 NOTE — Telephone Encounter (Signed)
Patient called to follow up on referral to ENT specialist; still hasn't received call for scheduling and issues with congestion persist (since last month). Please advise at  [phone] (cell).

## 2020-12-07 DIAGNOSIS — M26609 Unspecified temporomandibular joint disorder, unspecified side: Secondary | ICD-10-CM | POA: Insufficient documentation

## 2020-12-07 DIAGNOSIS — H6983 Other specified disorders of Eustachian tube, bilateral: Secondary | ICD-10-CM | POA: Diagnosis not present

## 2020-12-18 ENCOUNTER — Encounter: Payer: Self-pay | Admitting: Orthopedic Surgery

## 2020-12-18 ENCOUNTER — Ambulatory Visit: Payer: Medicare HMO

## 2020-12-18 ENCOUNTER — Other Ambulatory Visit: Payer: Self-pay

## 2020-12-18 ENCOUNTER — Ambulatory Visit: Payer: Medicare HMO | Admitting: Orthopedic Surgery

## 2020-12-18 VITALS — BP 134/89 | HR 76 | Ht 65.0 in | Wt 141.6 lb

## 2020-12-18 DIAGNOSIS — M25531 Pain in right wrist: Secondary | ICD-10-CM

## 2020-12-18 DIAGNOSIS — S52531A Colles' fracture of right radius, initial encounter for closed fracture: Secondary | ICD-10-CM | POA: Diagnosis not present

## 2020-12-18 MED ORDER — OXYCODONE-ACETAMINOPHEN 5-325 MG PO TABS: 1.0000 | ORAL_TABLET | Freq: Four times a day (QID) | ORAL | 0 refills | Status: AC | PRN

## 2020-12-18 MED ORDER — IBUPROFEN 800 MG PO TABS: 800.0000 mg | ORAL_TABLET | Freq: Three times a day (TID) | ORAL | 1 refills | Status: DC | PRN
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
## 2021-01-02 ENCOUNTER — Telehealth: Payer: Self-pay | Admitting: Orthopedic Surgery

## 2021-01-02 DIAGNOSIS — H6501 Acute serous otitis media, right ear: Secondary | ICD-10-CM | POA: Insufficient documentation

## 2021-01-02 DIAGNOSIS — H6983 Other specified disorders of Eustachian tube, bilateral: Secondary | ICD-10-CM | POA: Diagnosis not present

## 2021-01-02 DIAGNOSIS — S52531A Colles' fracture of right radius, initial encounter for closed fracture: Secondary | ICD-10-CM | POA: Insufficient documentation

## 2021-01-02 DIAGNOSIS — H903 Sensorineural hearing loss, bilateral: Secondary | ICD-10-CM | POA: Insufficient documentation

## 2021-01-02 NOTE — Telephone Encounter (Signed)
Patient called and states his cast is wet and stinks, and he knows he needs to wear it but he just wants it taken off this week.  He can't stand it anymore.  I advised  him to come in today and he said no he can't do today.  He states he is [facility] in [place] right now, he can come later this week.    I asked him how did it get wet?  He said he guess it is from him sweating so much.   I spoke with Toniann Fail about it and she said to add him on  tomorrow at 10:20, since he can't come today.

## 2021-01-03 ENCOUNTER — Other Ambulatory Visit: Payer: Self-pay

## 2021-01-03 ENCOUNTER — Ambulatory Visit (INDEPENDENT_AMBULATORY_CARE_PROVIDER_SITE_OTHER): Payer: Medicare HMO | Admitting: Orthopedic Surgery

## 2021-01-03 ENCOUNTER — Ambulatory Visit: Payer: Medicare HMO

## 2021-01-03 ENCOUNTER — Encounter: Payer: Self-pay | Admitting: Orthopedic Surgery

## 2021-01-03 DIAGNOSIS — S52513D Displaced fracture of unspecified radial styloid process, subsequent encounter for closed fracture with routine healing: Secondary | ICD-10-CM | POA: Diagnosis not present

## 2021-01-03 DIAGNOSIS — S52531D Colles' fracture of right radius, subsequent encounter for closed fracture with routine healing: Secondary | ICD-10-CM

## 2021-01-03 NOTE — Progress Notes (Signed)
Chief Complaint  Patient presents with   Wrist Injury    Right 12/17/20    Encounter Diagnosis  Name Primary?   Closed Colles' fracture of right radius with routine healing, subsequent encounter 12/18/20 Yes   50 year old male casted for right distal radius fracture  His cast got wet.  He does not like the cast  He has no pain with flexion extension but does have pain with rotation of the wrist and mild tenderness over the distal radius  The x-rays show all nonoperative treatment parameters have been met there is some slight shortening some slight dorsal tilting but overall it looks good  He is acceptable to wearing a forearm brace with x-rays in 3 weeks

## 2021-01-15 ENCOUNTER — Encounter: Payer: Medicare HMO | Admitting: Orthopedic Surgery

## 2021-01-24 ENCOUNTER — Other Ambulatory Visit: Payer: Self-pay

## 2021-01-24 ENCOUNTER — Ambulatory Visit: Payer: Medicare HMO

## 2021-01-24 ENCOUNTER — Ambulatory Visit (INDEPENDENT_AMBULATORY_CARE_PROVIDER_SITE_OTHER): Payer: Medicare HMO | Admitting: Orthopedic Surgery

## 2021-01-24 DIAGNOSIS — S52531D Colles' fracture of right radius, subsequent encounter for closed fracture with routine healing: Secondary | ICD-10-CM | POA: Diagnosis not present

## 2021-01-24 NOTE — Progress Notes (Signed)
Chief Complaint  Patient presents with   Wrist Pain    DOI 12/18/20/R/ hurts and but I can tell its getting better.    Encounter Diagnosis  Name Primary?   Closed Colles' fracture of right radius with routine healing, subsequent encounter 12/18/20 Yes   50 year old male distal radius fracture treated with a cast and then a brace he has pain when he is out of the brace with ulnar radial deviation but not with extension flexion  No tenderness at the fracture site  X-rays show that the fracture is healed with slight shortening  I told him to wear his brace for heavy activity and he can remove it when he is not doing any heavy work and he can follow-up as needed

## 2021-03-12 ENCOUNTER — Ambulatory Visit: Payer: Medicare HMO

## 2021-03-12 ENCOUNTER — Encounter: Payer: Self-pay | Admitting: Orthopedic Surgery

## 2021-03-12 ENCOUNTER — Ambulatory Visit (INDEPENDENT_AMBULATORY_CARE_PROVIDER_SITE_OTHER): Payer: Medicare HMO | Admitting: Orthopedic Surgery

## 2021-03-12 ENCOUNTER — Other Ambulatory Visit: Payer: Self-pay

## 2021-03-12 DIAGNOSIS — S52531D Colles' fracture of right radius, subsequent encounter for closed fracture with routine healing: Secondary | ICD-10-CM | POA: Diagnosis not present

## 2021-03-12 DIAGNOSIS — M654 Radial styloid tenosynovitis [de Quervain]: Secondary | ICD-10-CM | POA: Diagnosis not present

## 2021-03-12 MED ORDER — OXYCODONE-ACETAMINOPHEN 5-325 MG PO TABS: 1.0000 | ORAL_TABLET | Freq: Four times a day (QID) | ORAL | 0 refills | Status: AC | PRN

## 2021-03-12 MED ORDER — PREDNISONE 10 MG PO TABS: 10.0000 mg | ORAL_TABLET | Freq: Three times a day (TID) | ORAL | 0 refills | Status: DC

## 2021-03-12 NOTE — Patient Instructions (Addendum)
Wear the brace except to bathe for 6 weeks   And except when applying ice for 20 min 3 times day   Take these medication s  Meds ordered this encounter  Medications   oxyCODONE-acetaminophen (PERCOCET/ROXICET) 5-325 MG tablet    Sig: Take 1 tablet by mouth every 6 (six) hours as needed for up to 5 days for severe pain.    Dispense:  20 tablet    Refill:  0   predniSONE (DELTASONE) 10 MG tablet    Sig: Take 1 tablet (10 mg total) by mouth 3 (three) times daily.    Dispense:  42 tablet    Refill:  0

## 2021-03-12 NOTE — Progress Notes (Signed)
Chief Complaint  Patient presents with   Wrist Pain    Right/ states pain ulnar styloid into forearm history of wrist fracture 12/18/20    C/o pain right wrist and forearm  H/o fracture d radius  Exam:  Tender forearm  + finkelstein N/T D RADIUS   XRAYS SHOW HEALED D RADIUS   Encounter Diagnoses  Name Primary?   Closed Colles' fracture of right radius with routine healing, subsequent encounter    Tendinitis, de Quervain's Yes    REC BRACE FOR 6 WEEK S  Meds ordered this encounter  Medications   oxyCODONE-acetaminophen (PERCOCET/ROXICET) 5-325 MG tablet    Sig: Take 1 tablet by mouth every 6 (six) hours as needed for up to 5 days for severe pain.    Dispense:  20 tablet    Refill:  0   predniSONE (DELTASONE) 10 MG tablet    Sig: Take 1 tablet (10 mg total) by mouth 3 (three) times daily.    Dispense:  42 tablet    Refill:  0

## 2021-04-13 ENCOUNTER — Telehealth: Payer: Self-pay | Admitting: Orthopedic Surgery

## 2021-04-13 NOTE — Telephone Encounter (Signed)
Patient had called to ask if medication can be prescribed for pain in same arm; if so, states uses [facility] on [address], [place]

## 2021-04-17 NOTE — Telephone Encounter (Signed)
He has appointment 04/23/21  I will call him

## 2021-04-18 ENCOUNTER — Other Ambulatory Visit: Payer: Self-pay | Admitting: Orthopedic Surgery

## 2021-04-18 MED ORDER — IBUPROFEN 800 MG PO TABS: 800.0000 mg | ORAL_TABLET | Freq: Three times a day (TID) | ORAL | 1 refills | Status: DC | PRN

## 2021-04-18 NOTE — Progress Notes (Signed)
.  [facility] ordered this encounter  Medications   ibuprofen (ADVIL) 800 MG tablet    Sig: Take 1 tablet (800 mg total) by mouth every 8 (eight) hours as needed.    Dispense:  90 tablet    Refill:  1

## 2021-04-18 NOTE — Telephone Encounter (Signed)
He wants Ibuprofen 800mg  sent in to [facility] He has been taking OTC ibuprofen but not much states its "bad for him" I advised he needs the anti inflammatory meds for the inflammation

## 2021-04-23 ENCOUNTER — Other Ambulatory Visit: Payer: Self-pay | Admitting: Orthopedic Surgery

## 2021-04-23 ENCOUNTER — Other Ambulatory Visit: Payer: Self-pay

## 2021-04-23 ENCOUNTER — Ambulatory Visit: Payer: Medicare HMO | Admitting: Orthopedic Surgery

## 2021-04-23 DIAGNOSIS — S52531D Colles' fracture of right radius, subsequent encounter for closed fracture with routine healing: Secondary | ICD-10-CM

## 2021-04-23 DIAGNOSIS — Z87828 Personal history of other (healed) physical injury and trauma: Secondary | ICD-10-CM

## 2021-04-23 DIAGNOSIS — M25531 Pain in right wrist: Secondary | ICD-10-CM

## 2021-04-23 NOTE — Patient Instructions (Signed)
While we are working on your approval for MRI please go ahead and call to schedule your appointment with [facility] within at least one (1) week.   Central Scheduling [phone]  

## 2021-04-23 NOTE — Progress Notes (Signed)
Chief Complaint  Patient presents with   Follow-up    Recheck on right wrist     Jeremy Hancock is 51 years old he is status post closed treatment of distal radius fracture on the right back in September 2022 he developed some de Quervain's tenosynovitis which was treated in December 2022 however he comes in today with ulnar-sided wrist pain  This was not relieved with ibuprofen  Physical Exam Constitutional:      General: He is not in acute distress.    Appearance: He is well-developed.     Comments: Well developed, well nourished Normal grooming and hygiene     Cardiovascular:     Comments: No peripheral edema Skin:    General: Skin is warm and dry.  Neurological:     Mental Status: He is alert and oriented to person, place, and time.     Sensory: No sensory deficit.     Coordination: Coordination normal.     Gait: Gait normal.     Deep Tendon Reflexes: Reflexes are normal and symmetric.  Psychiatric:        Mood and Affect: Mood normal.        Behavior: Behavior normal.        Thought Content: Thought content normal.        Judgment: Judgment normal.     Comments: Affect normal    Musculoskeletal exam shows normal wrist and set extension and flexion but he has pain with pronation supination on the ulnar side of the wrist with palpable tenderness over the TFCC  Assessment and plan  He did try bracing the wrist and we tried anti-inflammatories with no improvement  Recommend MRI with intra-articular contrast to evaluate the TFCC and ulnar side of the wrist  It should be noted that his x-ray shows slight ulnar positive variance with otherwise normal healing of the fracture

## 2021-05-04 ENCOUNTER — Telehealth: Payer: Self-pay | Admitting: Family Medicine

## 2021-05-04 ENCOUNTER — Other Ambulatory Visit: Payer: Self-pay

## 2021-05-04 ENCOUNTER — Ambulatory Visit (INDEPENDENT_AMBULATORY_CARE_PROVIDER_SITE_OTHER): Payer: Medicare HMO

## 2021-05-04 VITALS — BP 110/52 | HR 74 | Ht 65.0 in | Wt 142.0 lb

## 2021-05-04 DIAGNOSIS — Z0000 Encounter for general adult medical examination without abnormal findings: Secondary | ICD-10-CM | POA: Diagnosis not present
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
## 2021-05-04 NOTE — Patient Instructions (Signed)
Jeremy Hancock , Thank you for taking time to come for your Medicare Wellness Visit. I appreciate your ongoing commitment to your health goals. Please review the following plan we discussed and let me know if I can assist you in the future.   Screening recommendations/referrals: Colonoscopy: Done. Keep recommended appointment with Dr. Loreta Ave.  Recommended yearly ophthalmology/optometry visit for glaucoma screening and checkup Recommended yearly dental visit for hygiene and checkup  Vaccinations: Influenza vaccine: Declined. Pneumococcal vaccine: Due at age 75. Tdap vaccine:  Done 07/31/2015 Repeat in 10 years  Shingles vaccine: Due at age 51.   Covid-19: Declined.  Advanced directives: Advance directive discussed with you today. Even though you declined this today, please call our office should you change your mind, and we can give you the proper paperwork for you to fill out.   Conditions/risks identified: Aim for 30 minutes of exercise or brisk walking each day, drink 6-8 glasses of water and eat lots of fruits and vegetables.   Next appointment: Follow up in one year for your annual wellness visit 2024.  Preventive Care 40-64 Years, Male Preventive care refers to lifestyle choices and visits with your health care provider that can promote health and wellness. What does preventive care include? A yearly physical exam. This is also called an annual well check. Dental exams once or twice a year. Routine eye exams. Ask your health care provider how often you should have your eyes checked. Personal lifestyle choices, including: Daily care of your teeth and gums. Regular physical activity. Eating a healthy diet. Avoiding tobacco and drug use. Limiting alcohol use. Practicing safe sex. Taking low-dose aspirin every day starting at age 49. What happens during an annual well check? The services and screenings done by your health care provider during your annual well check will depend on  your age, overall health, lifestyle risk factors, and family history of disease. Counseling  Your health care provider may ask you questions about your: Alcohol use. Tobacco use. Drug use. Emotional well-being. Home and relationship well-being. Sexual activity. Eating habits. Work and work Astronomer. Screening  You may have the following tests or measurements: Height, weight, and BMI. Blood pressure. Lipid and cholesterol levels. These may be checked every 5 years, or more frequently if you are over 11 years old. Skin check. Lung cancer screening. You may have this screening every year starting at age 31 if you have a 30-pack-year history of smoking and currently smoke or have quit within the past 15 years. Fecal occult blood test (FOBT) of the stool. You may have this test every year starting at age 65. Flexible sigmoidoscopy or colonoscopy. You may have a sigmoidoscopy every 5 years or a colonoscopy every 10 years starting at age 9. Prostate cancer screening. Recommendations will vary depending on your family history and other risks. Hepatitis C blood test. Hepatitis B blood test. Sexually transmitted disease (STD) testing. Diabetes screening. This is done by checking your blood sugar (glucose) after you have not eaten for a while (fasting). You may have this done every 1-3 years. Discuss your test results, treatment options, and if necessary, the need for more tests with your health care provider. Vaccines  Your health care provider may recommend certain vaccines, such as: Influenza vaccine. This is recommended every year. Tetanus, diphtheria, and acellular pertussis (Tdap, Td) vaccine. You may need a Td booster every 10 years. Zoster vaccine. You may need this after age 85. Pneumococcal 13-valent conjugate (PCV13) vaccine. You may need this if you have  certain conditions and have not been vaccinated. Pneumococcal polysaccharide (PPSV23) vaccine. You may need one or two doses if  you smoke cigarettes or if you have certain conditions. Talk to your health care provider about which screenings and vaccines you need and how often you need them. This information is not intended to replace advice given to you by your health care provider. Make sure you discuss any questions you have with your health care provider. Document Released: 04/14/2015 Document Revised: 12/06/2015 Document Reviewed: 01/17/2015 Elsevier Interactive Patient Education  2017 [employer].  Fall Prevention in the Home Falls can cause injuries. They can happen to people of all ages. There are many things you can do to make your home safe and to help prevent falls. What can I do on the outside of my home? Regularly fix the edges of walkways and driveways and fix any cracks. Remove anything that might make you trip as you walk through a door, such as a raised step or threshold. Trim any bushes or trees on the path to your home. Use bright outdoor lighting. Clear any walking paths of anything that might make someone trip, such as rocks or tools. Regularly check to see if handrails are loose or broken. Make sure that both sides of any steps have handrails. Any raised decks and porches should have guardrails on the edges. Have any leaves, snow, or ice cleared regularly. Use sand or salt on walking paths during winter. Clean up any spills in your garage right away. This includes oil or grease spills. What can I do in the bathroom? Use night lights. Install grab bars by the toilet and in the tub and shower. Do not use towel bars as grab bars. Use non-skid mats or decals in the tub or shower. If you need to sit down in the shower, use a plastic, non-slip stool. Keep the floor dry. Clean up any water that spills on the floor as soon as it happens. Remove soap buildup in the tub or shower regularly. Attach bath mats securely with double-sided non-slip rug tape. Do not have throw rugs and other things on the  floor that can make you trip. What can I do in the bedroom? Use night lights. Make sure that you have a light by your bed that is easy to reach. Do not use any sheets or blankets that are too big for your bed. They should not hang down onto the floor. Have a firm chair that has side arms. You can use this for support while you get dressed. Do not have throw rugs and other things on the floor that can make you trip. What can I do in the kitchen? Clean up any spills right away. Avoid walking on wet floors. Keep items that you use a lot in easy-to-reach places. If you need to reach something above you, use a strong step stool that has a grab bar. Keep electrical cords out of the way. Do not use floor polish or wax that makes floors slippery. If you must use wax, use non-skid floor wax. Do not have throw rugs and other things on the floor that can make you trip. What can I do with my stairs? Do not leave any items on the stairs. Make sure that there are handrails on both sides of the stairs and use them. Fix handrails that are broken or loose. Make sure that handrails are as long as the stairways. Check any carpeting to make sure that it is firmly attached  to the stairs. Fix any carpet that is loose or worn. Avoid having throw rugs at the top or bottom of the stairs. If you do have throw rugs, attach them to the floor with carpet tape. Make sure that you have a light switch at the top of the stairs and the bottom of the stairs. If you do not have them, ask someone to add them for you. What else can I do to help prevent falls? Wear shoes that: Do not have high heels. Have rubber bottoms. Are comfortable and fit you well. Are closed at the toe. Do not wear sandals. If you use a stepladder: Make sure that it is fully opened. Do not climb a closed stepladder. Make sure that both sides of the stepladder are locked into place. Ask someone to hold it for you, if possible. Clearly mark and make  sure that you can see: Any grab bars or handrails. First and last steps. Where the edge of each step is. Use tools that help you move around (mobility aids) if they are needed. These include: Canes. Walkers. Scooters. Crutches. Turn on the lights when you go into a dark area. Replace any light bulbs as soon as they burn out. Set up your furniture so you have a clear path. Avoid moving your furniture around. If any of your floors are uneven, fix them. If there are any pets around you, be aware of where they are. Review your medicines with your doctor. Some medicines can make you feel dizzy. This can increase your chance of falling. Ask your doctor what other things that you can do to help prevent falls. This information is not intended to replace advice given to you by your health care provider. Make sure you discuss any questions you have with your health care provider. Document Released: 01/12/2009 Document Revised: 08/24/2015 Document Reviewed: 04/22/2014 Elsevier Interactive Patient Education  2017 [employer].

## 2021-05-04 NOTE — Telephone Encounter (Signed)
Patient disputing outstanding balance for previous visits in 2019 and 2020; states insurance should have covered everything. Patient requesting for bills to be resubmitted to [facility] ID #[id number].  Please advise at [phone].

## 2021-05-08 ENCOUNTER — Other Ambulatory Visit: Payer: Self-pay

## 2021-05-08 ENCOUNTER — Ambulatory Visit (HOSPITAL_COMMUNITY)
Admission: RE | Admit: 2021-05-08 | Discharge: 2021-05-08 | Disposition: A | Discharge status: Home / Self Care | Payer: Medicare HMO | Source: Ambulatory Visit | Attending: Orthopedic Surgery | Admitting: Orthopedic Surgery

## 2021-05-08 DIAGNOSIS — Y33XXXD Other specified events, undetermined intent, subsequent encounter: Secondary | ICD-10-CM | POA: Insufficient documentation

## 2021-05-08 DIAGNOSIS — M25531 Pain in right wrist: Secondary | ICD-10-CM | POA: Insufficient documentation

## 2021-05-08 DIAGNOSIS — S52501A Unspecified fracture of the lower end of right radius, initial encounter for closed fracture: Secondary | ICD-10-CM | POA: Diagnosis not present

## 2021-05-08 DIAGNOSIS — Z87828 Personal history of other (healed) physical injury and trauma: Secondary | ICD-10-CM

## 2021-05-08 DIAGNOSIS — S52531D Colles' fracture of right radius, subsequent encounter for closed fracture with routine healing: Secondary | ICD-10-CM

## 2021-05-08 DIAGNOSIS — Z01818 Encounter for other preprocedural examination: Secondary | ICD-10-CM | POA: Diagnosis not present

## 2021-05-08 MED ORDER — IOHEXOL 180 MG/ML  SOLN
20.0000 mL | Freq: Once | INTRAMUSCULAR | Status: AC | PRN
  Administered 2021-05-08: 3 mL via INTRA_ARTICULAR

## 2021-05-08 MED ORDER — GADOBUTROL 1 MMOL/ML IV SOLN
2.0000 mL | Freq: Once | INTRAVENOUS | Status: AC | PRN
  Administered 2021-05-08: 0.05 mL

## 2021-05-08 MED ORDER — LIDOCAINE HCL (PF) 1 % IJ SOLN
5.0000 mL | Freq: Once | INTRAMUSCULAR | Status: AC
  Administered 2021-05-08: 1 mL via INTRADERMAL

## 2021-05-08 MED ORDER — SODIUM CHLORIDE (PF) 0.9 % IJ SOLN
10.0000 mL | Freq: Once | INTRAMUSCULAR | Status: AC
  Administered 2021-05-08: 10 mL via INTRAVENOUS

## 2021-05-09 ENCOUNTER — Other Ambulatory Visit (HOSPITAL_COMMUNITY): Payer: Medicare HMO

## 2021-05-09 ENCOUNTER — Ambulatory Visit (HOSPITAL_COMMUNITY): Payer: Medicare HMO

## 2021-05-10 ENCOUNTER — Ambulatory Visit (INDEPENDENT_AMBULATORY_CARE_PROVIDER_SITE_OTHER): Payer: Medicare HMO | Admitting: Family Medicine

## 2021-05-10 ENCOUNTER — Encounter: Payer: Self-pay | Admitting: Family Medicine

## 2021-05-10 ENCOUNTER — Ambulatory Visit: Payer: Medicare HMO | Admitting: Orthopedic Surgery

## 2021-05-10 ENCOUNTER — Other Ambulatory Visit: Payer: Self-pay

## 2021-05-10 VITALS — BP 122/78 | HR 76 | Temp 97.9°F | Resp 18 | Ht 65.0 in | Wt 142.0 lb

## 2021-05-10 DIAGNOSIS — S52531G Colles' fracture of right radius, subsequent encounter for closed fracture with delayed healing: Secondary | ICD-10-CM | POA: Diagnosis not present

## 2021-05-10 DIAGNOSIS — R051 Acute cough: Secondary | ICD-10-CM | POA: Diagnosis not present

## 2021-05-10 DIAGNOSIS — R5383 Other fatigue: Secondary | ICD-10-CM

## 2021-05-10 DIAGNOSIS — E538 Deficiency of other specified B group vitamins: Secondary | ICD-10-CM | POA: Diagnosis not present

## 2021-05-10 MED ORDER — HYDROCODONE-ACETAMINOPHEN 5-325 MG PO TABS
1.0000 | ORAL_TABLET | Freq: Four times a day (QID) | ORAL | 0 refills | Status: DC | PRN
Start: 2021-05-10 — End: 2021-06-26
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
## 2021-05-10 NOTE — Progress Notes (Signed)
Chief Complaint  Patient presents with   Wrist Pain    RT/ MRI results Pt is concerned he may have MRSA again    Vida had a distal radius fracture on the right we took him out of his cast back around October he had some de Quervain's tenosynovitis which was treated with splinting came back in January with continued wrist pain which seem to be ulnar.  I sent him for MRI arthrogram and it showed that he has a delayed union of his Colles' fracture and then he has a fracture in the lunate area Reexamination reveals more central pain than ulnar pain  MRI I see a persistent transverse fracture line at the distal radial metaphysis no TFCC tear full-thickness cartilage defect proximal lunate with subchondral edema  Recommend patient go back into the brace for 4 weeks  He could have some vitamin D issues he still smoking  I gave him some pain medicine we will keep that going for about 4 weeks until he comes back and we will reexamine clinically  Encounter Diagnosis  Name Primary?   Closed Colles' fracture of right radius with delayed healing, subsequent encounter Yes    Meds ordered this encounter  Medications   HYDROcodone-acetaminophen (NORCO/VICODIN) 5-325 MG tablet    Sig: Take 1 tablet by mouth every 6 (six) hours as needed for moderate pain.    Dispense:  30 tablet    Refill:  0

## 2021-05-11 LAB — CBC WITH DIFFERENTIAL/PLATELET
Absolute Monocytes: 756 cells/uL (ref 200–950)
Basophils Absolute: 90 cells/uL (ref 0–200)
Basophils Relative: 1 %
Eosinophils Absolute: 567 cells/uL — ABNORMAL HIGH (ref 15–500)
Eosinophils Relative: 6.3 %
HCT: 47 % (ref 38.5–50.0)
Hemoglobin: 16.1 g/dL (ref 13.2–17.1)
Lymphs Abs: 2871 cells/uL (ref 850–3900)
MCH: 29.8 pg (ref 27.0–33.0)
MCHC: 34.3 g/dL (ref 32.0–36.0)
MCV: 86.9 fL (ref 80.0–100.0)
MPV: 9.8 fL (ref 7.5–12.5)
Monocytes Relative: 8.4 %
Neutro Abs: 4716 cells/uL (ref 1500–7800)
Neutrophils Relative %: 52.4 %
Platelets: 268 10*3/uL (ref 140–400)
RBC: 5.41 10*6/uL (ref 4.20–5.80)
RDW: 12.8 % (ref 11.0–15.0)
Total Lymphocyte: 31.9 %
WBC: 9 10*3/uL (ref 3.8–10.8)

## 2021-05-11 LAB — COMPLETE METABOLIC PANEL WITH GFR
AG Ratio: 1.7 (calc) (ref 1.0–2.5)
ALT: 28 U/L (ref 9–46)
AST: 20 U/L (ref 10–40)
Albumin: 4.3 g/dL (ref 3.6–5.1)
Alkaline phosphatase (APISO): 81 U/L (ref 36–130)
BUN: 11 mg/dL (ref 7–25)
CO2: 27 mmol/L (ref 20–32)
Calcium: 9.5 mg/dL (ref 8.6–10.3)
Chloride: 104 mmol/L (ref 98–110)
Creat: 0.85 mg/dL (ref 0.60–1.29)
Globulin: 2.5 g/dL (calc) (ref 1.9–3.7)
Glucose, Bld: 77 mg/dL (ref 65–99)
Potassium: 4.1 mmol/L (ref 3.5–5.3)
Sodium: 139 mmol/L (ref 135–146)
Total Bilirubin: 0.4 mg/dL (ref 0.2–1.2)
Total Protein: 6.8 g/dL (ref 6.1–8.1)
eGFR: 107 mL/min/{1.73_m2} (ref >=60)

## 2021-05-11 LAB — TESTOSTERONE TOTAL,FREE,BIO, MALES
Albumin: 4.3 g/dL (ref 3.6–5.1)
Sex Hormone Binding: 41 nmol/L (ref 10–50)
Testosterone, Bioavailable: 69.7 ng/dL — ABNORMAL LOW (ref 110.0–575.0)
Testosterone, Free: 35.4 pg/mL — ABNORMAL LOW (ref 46.0–224.0)
Testosterone: 328 ng/dL (ref 250–827)

## 2021-05-11 LAB — TSH: TSH: 1.31 mIU/L (ref 0.40–4.50)

## 2021-05-11 LAB — VITAMIN B12: Vitamin B-12: 421 pg/mL (ref 200–1100)

## 2021-05-11 LAB — SEDIMENTATION RATE: Sed Rate: 14 mm/h (ref 0–15)

## 2021-05-17 ENCOUNTER — Ambulatory Visit: Payer: Medicare HMO | Admitting: Orthopedic Surgery
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
## 2021-05-17 NOTE — Telephone Encounter (Signed)
Pt called back to returning call he received from administrator about his bill. Informed pt about the charges where from unpaid co-pays. Pt understand and agreed to call back and make complete or partial payment of this bill after the first of the month.

## 2021-06-05 ENCOUNTER — Encounter: Payer: Self-pay | Admitting: Cardiovascular Disease

## 2021-06-05 ENCOUNTER — Ambulatory Visit: Payer: Medicare HMO | Admitting: Cardiovascular Disease

## 2021-06-05 ENCOUNTER — Other Ambulatory Visit: Payer: Self-pay

## 2021-06-05 VITALS — BP 118/86 | HR 69 | Ht 65.0 in | Wt 150.0 lb

## 2021-06-05 DIAGNOSIS — F17200 Nicotine dependence, unspecified, uncomplicated: Secondary | ICD-10-CM | POA: Diagnosis not present

## 2021-06-05 DIAGNOSIS — E782 Mixed hyperlipidemia: Secondary | ICD-10-CM | POA: Diagnosis not present

## 2021-06-05 DIAGNOSIS — R072 Precordial pain: Secondary | ICD-10-CM | POA: Diagnosis not present

## 2021-06-05 DIAGNOSIS — R69 Illness, unspecified: Secondary | ICD-10-CM | POA: Diagnosis not present

## 2021-06-05 DIAGNOSIS — I208 Other forms of angina pectoris: Secondary | ICD-10-CM | POA: Diagnosis not present

## 2021-06-05 MED ORDER — METOPROLOL TARTRATE 100 MG PO TABS: 100.0000 mg | ORAL_TABLET | Freq: Once | ORAL | 0 refills | Status: DC

## 2021-06-05 NOTE — Assessment & Plan Note (Signed)
Ongoing tobacco abuse of 1 pack/day recalcitrant to risk factor modification.  We did discuss the importance of smoking cessation. ?
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
## 2021-06-05 NOTE — Assessment & Plan Note (Signed)
History of hyperlipidemia intolerant to statin therapy.  His most recent lipid profile performed 11/08/2020 revealed total cholesterol 225, LDL of 161 and HDL of 32.  I am going to have him see one of our Pharm.D.'s to discuss PCSK9. ?

## 2021-06-05 NOTE — Assessment & Plan Note (Signed)
History of atypical chest pain with radial diagnostic cath performed by myself 05/20/2016 which was entirely normal.  Did have a 30% proximal LAD lesion at that time with normal LV function.  Because of ongoing symptoms and since its been 5 years I am going to get a coronary CTA to further evaluate. ?

## 2021-06-05 NOTE — Patient Instructions (Addendum)
Medication Instructions:  ?Your physician recommends that you continue on your current medications as directed. Please refer to the Current Medication list given to you today. ? ? ?*If you need a refill on your cardiac medications before your next appointment, please call your pharmacy* ? ? ?Lab Work: ?Your physician recommends that you return for lab work in: within 7 days of coronary CTA ? ?If you have labs (blood work) drawn today and your tests are completely normal, you will receive your results only by: ?MyChart Message (if you have MyChart) OR ?A paper copy in the mail ?If you have any lab test that is abnormal or we need to change your treatment, we will call you to review the results. ? ? ?Testing/Procedures: ?Your physician has requested that you have an echocardiogram. Echocardiography is a painless test that uses sound waves to create images of your heart. It provides your doctor with information about the size and shape of your heart and how well your heart?s chambers and valves are working. This procedure takes approximately one hour. There are no restrictions for this procedure. This procedure will be done at [address] ? ? ? ?Follow-Up: ?At [facility], you and your health needs are our priority.  As part of our continuing mission to provide you with exceptional heart care, we have created designated Provider Care Teams.  These Care Teams include your primary Cardiologist (physician) and Advanced Practice Providers (APPs -  Physician Assistants and Nurse Practitioners) who all work together to provide you with the care you need, when you need it. ? ?We recommend signing up for the patient portal called "MyChart".  Sign up information is provided on this After Visit Summary.  MyChart is used to connect with patients for Virtual Visits (Telemedicine).  Patients are able to view lab/test results, encounter notes, upcoming appointments, etc.  Non-urgent messages can be sent to your  provider as well.   ?To learn more about what you can do with MyChart, go to [email or web address].   ? ?Your next appointment:   ?6 month(s) ? ?The format for your next appointment:   ?In Person ? ?Provider:   ?Edd Fabian, FNP, Micah Flesher, PA-C, Marjie Skiff, PA-C, Juanda Crumble, PA-C, Joni Reining, DNP, ANP, or Azalee Course, PA-C    ? ?Then, Nanetta Batty, MD will plan to see you again in 12 month(s). ? ? ?Other Instructions ? ?-We will schedule an appointment to see a PharmD to discuss lipid management. ? ? ? ?Your cardiac CT will be scheduled at one of the below locations:  ? ?[facility] ?[address] ?[place], [place] [address] ?[phone] ? ?If scheduled at [facility], please arrive at the [facility] (Entrance C2) of [facility] 30 minutes prior to test start time. ?You can use the FREE valet parking offered at entrance C (encouraged to control the heart rate for the test)  ?Proceed to the [facility] Radiology Department (first floor) to check-in and test prep. ? ?All radiology patients and guests should use entrance C2 at [facility], accessed from [facility], even though the hospital's physical address listed is [address] ? ? ? ?Please follow these instructions carefully (unless otherwise directed): ? ?Hold all erectile dysfunction medications at least 3 days (72 hrs) prior to test. ? ?On the Night Before the Test: ?Be sure to Drink plenty of water. ?Do not consume any caffeinated/decaffeinated beverages or chocolate 12 hours  prior to your test. ?Do not take any antihistamines 12 hours prior to your test. ? ?On the Day of the Test: ?Drink plenty of water until 1 hour prior to the test. ?Do not eat any food 4 hours prior to the test. ?You may take your regular medications prior to the test.  ?Take metoprolol (Lopressor)100mg  two hours prior to test. ? ?     ?After the Test: ?Drink plenty of water. ?After  receiving IV contrast, you may experience a mild flushed feeling. This is normal. ?On occasion, you may experience a mild rash up to 24 hours after the test. This is not dangerous. If this occurs, you can take Benadryl 25 mg and increase your fluid intake. ?If you experience trouble breathing, this can be serious. If it is severe call 911 IMMEDIATELY. If it is mild, please call our office. ?If you take any of these medications: Glipizide/Metformin, Avandament, Glucavance, please do not take 48 hours after completing test unless otherwise instructed. ? ?We will call to schedule your test 2-4 weeks out understanding that some insurance companies will need an authorization prior to the service being performed.  ? ?For non-scheduling related questions, please contact the cardiac imaging nurse navigator should you have any questions/concerns: ?Rockwell Alexandria, Cardiac Imaging Nurse Navigator ?Larey Brick, Cardiac Imaging Nurse Navigator ? Heart [facility] V[facility] Services ?Direct Office Dial: [phone]  ? ?For scheduling needs, including cancellations and rescheduling, please call [place], [phone].  ? ?

## 2021-06-07 ENCOUNTER — Ambulatory Visit: Payer: Medicare HMO | Admitting: Orthopedic Surgery

## 2021-06-11 ENCOUNTER — Ambulatory Visit: Payer: Medicare HMO | Admitting: Orthopedic Surgery

## 2021-06-11 ENCOUNTER — Other Ambulatory Visit: Payer: Self-pay

## 2021-06-11 DIAGNOSIS — S52531G Colles' fracture of right radius, subsequent encounter for closed fracture with delayed healing: Secondary | ICD-10-CM | POA: Diagnosis not present

## 2021-06-11 NOTE — Progress Notes (Signed)
Chief Complaint  ?Patient presents with  ? Wrist Injury  ?  DOI November 2022 ?S/p closed colles' fracture of RIGHT radius with delayed healing  ? ?Encounter Diagnosis  ?Name Primary?  ? Closed Colles' fracture of right radius with delayed healing, subsequent encounter Yes  ? ? ?Jeremy Hancock is in a splint for his delayed union right Colles' fracture ? ?He says he is finally getting better he has some pain when he turns the wrist over the ulnar area and radial ulnar joint ? ?Review of systems he is undergoing a heart work-up for cardiac symptoms ? ?He is having some discomfort in his right knee status post ORIF patella ? ?Exam shows he has full range of motion of the hand some tenderness over the radial ulnar joint ? ?Recommend bracing taper in terms of the wrist ? ?Call us after cardiology is finished his work-up and come back in for right knee x-ray and plan for hardware removal right knee ?

## 2021-06-14 ENCOUNTER — Telehealth (HOSPITAL_COMMUNITY): Payer: Self-pay | Admitting: *Deleted

## 2021-06-14 NOTE — Telephone Encounter (Signed)
Reaching out to patient to offer assistance regarding upcoming cardiac imaging study; pt verbalizes understanding of appt date/time, parking situation and where to check in, pre-test NPO status and medications ordered, and verified current allergies; name and call back number provided for further questions should they arise ° °Jadence Kinlaw RN Navigator Cardiac Imaging ° Heart and Vascular °[phone] office °[phone] cell ° °Patient to take 100mg metoprolol tartrate two hours prior to his cardiac CT scan. He is aware to arrive at 2pm for his 2:30pm scan. °

## 2021-06-15 ENCOUNTER — Other Ambulatory Visit: Payer: Self-pay

## 2021-06-15 ENCOUNTER — Ambulatory Visit (HOSPITAL_COMMUNITY)
Admission: RE | Admit: 2021-06-15 | Discharge: 2021-06-15 | Disposition: A | Discharge status: Home / Self Care | Payer: Medicare HMO | Source: Ambulatory Visit | Attending: Cardiovascular Disease | Admitting: Cardiovascular Disease

## 2021-06-15 DIAGNOSIS — R072 Precordial pain: Secondary | ICD-10-CM | POA: Insufficient documentation

## 2021-06-15 MED ORDER — IOHEXOL 350 MG/ML SOLN
100.0000 mL | Freq: Once | INTRAVENOUS | Status: AC | PRN
  Administered 2021-06-15: 100 mL via INTRAVENOUS

## 2021-06-15 MED ORDER — NITROGLYCERIN 0.4 MG SL SUBL
SUBLINGUAL_TABLET | SUBLINGUAL | Status: AC
  Administered 2021-06-15: 0.8 mg via SUBLINGUAL
  Filled 2021-06-15: qty 2

## 2021-06-15 MED ORDER — NITROGLYCERIN 0.4 MG SL SUBL: 0.8000 mg | SUBLINGUAL_TABLET | Freq: Once | SUBLINGUAL | Status: AC

## 2021-06-18 ENCOUNTER — Other Ambulatory Visit: Payer: Self-pay

## 2021-06-18 ENCOUNTER — Ambulatory Visit (HOSPITAL_COMMUNITY): Payer: Medicare HMO | Attending: Cardiology

## 2021-06-18 DIAGNOSIS — F17200 Nicotine dependence, unspecified, uncomplicated: Secondary | ICD-10-CM | POA: Diagnosis present

## 2021-06-18 DIAGNOSIS — E782 Mixed hyperlipidemia: Secondary | ICD-10-CM | POA: Diagnosis not present

## 2021-06-18 DIAGNOSIS — I208 Other forms of angina pectoris: Secondary | ICD-10-CM | POA: Insufficient documentation

## 2021-06-18 DIAGNOSIS — R072 Precordial pain: Secondary | ICD-10-CM | POA: Diagnosis not present

## 2021-06-18 DIAGNOSIS — R079 Chest pain, unspecified: Secondary | ICD-10-CM | POA: Diagnosis not present

## 2021-06-18 DIAGNOSIS — R69 Illness, unspecified: Secondary | ICD-10-CM | POA: Diagnosis not present

## 2021-06-18 LAB — ECHOCARDIOGRAM COMPLETE
Area-P 1/2: 2.57 cm2
S' Lateral: 3.5 cm

## 2021-06-19 ENCOUNTER — Other Ambulatory Visit (HOSPITAL_COMMUNITY): Payer: Medicare HMO

## 2021-06-26 ENCOUNTER — Ambulatory Visit: Payer: Medicare HMO | Admitting: Pharmacist

## 2021-06-26 ENCOUNTER — Telehealth: Payer: Self-pay | Admitting: Pharmacist

## 2021-06-26 ENCOUNTER — Other Ambulatory Visit: Payer: Self-pay

## 2021-06-26 VITALS — BP 124/76 | HR 70 | Resp 17 | Ht 65.0 in | Wt 152.4 lb

## 2021-06-26 DIAGNOSIS — M791 Myalgia, unspecified site: Secondary | ICD-10-CM | POA: Diagnosis not present

## 2021-06-26 DIAGNOSIS — T466X5A Adverse effect of antihyperlipidemic and antiarteriosclerotic drugs, initial encounter: Secondary | ICD-10-CM | POA: Diagnosis not present

## 2021-06-26 DIAGNOSIS — R931 Abnormal findings on diagnostic imaging of heart and coronary circulation: Secondary | ICD-10-CM | POA: Insufficient documentation

## 2021-06-26 DIAGNOSIS — E785 Hyperlipidemia, unspecified: Secondary | ICD-10-CM

## 2021-06-26 DIAGNOSIS — E782 Mixed hyperlipidemia: Secondary | ICD-10-CM | POA: Diagnosis not present

## 2021-06-26 MED ORDER — PRALUENT 75 MG/ML ~~LOC~~ SOAJ: 75.0000 mg | SUBCUTANEOUS | 11 refills | Status: DC

## 2021-06-26 NOTE — Patient Instructions (Signed)
It was nice meeting you today ? ?We would like your LDL (bad cholesterol) to be less than 70 ? ?We will start a new medication called Repatha or Praluent which you will inject once every 2 weeks ? ?We will complete the prior authorization for you and contact you when it is approved ? ?Once you start the medication we will recheck your cholesterol in 2-3 months ? ?Please call with any questions ? ?Karren Cobble, PharmD, BCACP, [place], CPP ?[address], [address] ?[place], [place], [address] ?Phone: [phone], Fax: [phone]  ?

## 2021-06-26 NOTE — Telephone Encounter (Signed)
Called and spoke and stated that they were approved for the praluent 75mg  q2w and to complete fasting labs post 4th dose and if they need assistance with cost. Pt voiced understanding.  ?

## 2021-06-26 NOTE — Addendum Note (Signed)
Addended by: Eather Colas on: 06/26/2021 11:50 AM ? ? Modules accepted: Orders ? ?
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
## 2021-06-26 NOTE — Telephone Encounter (Signed)
Please complete prior authorization for: ? ?Name of medication, dose, and frequency repatha 140mg sq q 14 days or Praluen t75mg sq q 14 days ? ?Lab Orders Requested? yes ? ?Which labs? Lipid panel ? ?Estimated date for labs to be scheduled 2-3 months ? ?Does patient need activated copay card? no  ?

## 2021-06-26 NOTE — Telephone Encounter (Signed)
PA SUBMITTED FOR PRALUENT 75: ? ?Danni Vipond (Key: [id number]) - [id number] ?Praluent 75MG /ML auto-injectors ?Status: PA Request ?Created: March 28th, 2023 ?Sent: March 28th, 2023 ? ?LIPID PANEL ORDERED AND RELEASED ?

## 2021-06-29 ENCOUNTER — Telehealth: Payer: Self-pay | Admitting: Orthopedic Surgery

## 2021-06-29 NOTE — Telephone Encounter (Signed)
Patient cleared by cardio. See message. Thanks.  ?

## 2021-06-29 NOTE — Telephone Encounter (Signed)
Patient called to relay that he has been cleared by his cardiologist Dr Nanetta Batty, to have hardware removal surgery done. States would like to have it done in May. Please advise.  ?

## 2021-07-18 ENCOUNTER — Telehealth: Payer: Self-pay | Admitting: Cardiovascular Disease

## 2021-07-18 NOTE — Telephone Encounter (Signed)
?*  STAT* If patient is at the pharmacy, call can be transferred to refill team. ? ? ?1. Which medications need to be refilled? (please list name of each medication and dose if known) Alirocumab ([place]) 75 MG/ML SOAJ ? ?2. Which pharmacy/location (including street and city if local pharmacy) is medication to be sent to? [id number] - [place], [place] [address] ? ?3. Do they need a 30 day or 90 day supply? 90 ? ?

## 2021-07-19 ENCOUNTER — Other Ambulatory Visit: Payer: Self-pay

## 2021-07-19 MED ORDER — PRALUENT 75 MG/ML ~~LOC~~ SOAJ: 75.0000 mg | SUBCUTANEOUS | 11 refills | Status: DC

## 2021-07-19 NOTE — Telephone Encounter (Signed)
Called patient to advise medication refill sent to pharmacy °

## 2021-08-09 DIAGNOSIS — E785 Hyperlipidemia, unspecified: Secondary | ICD-10-CM | POA: Diagnosis not present

## 2021-08-10 LAB — LIPID PANEL
Chol/HDL Ratio: 3.6 ratio (ref 0.0–5.0)
Cholesterol, Total: 142 mg/dL (ref 100–199)
HDL: 40 mg/dL (ref >39)
LDL Chol Calc (NIH): 68 mg/dL (ref 0–99)
Triglycerides: 202 mg/dL — ABNORMAL HIGH (ref 0–149)
VLDL Cholesterol Cal: 34 mg/dL (ref 5–40)

## 2021-08-14 ENCOUNTER — Telehealth: Payer: Self-pay | Admitting: Cardiovascular Disease

## 2021-08-14 ENCOUNTER — Other Ambulatory Visit: Payer: Self-pay | Admitting: Pharmacist Clinician (PhC)/ Clinical Pharmacy Specialist

## 2021-08-14 MED ORDER — PRALUENT 75 MG/ML ~~LOC~~ SOAJ: 75.0000 mg | SUBCUTANEOUS | 4 refills | Status: DC

## 2021-08-14 NOTE — Telephone Encounter (Signed)
Patient calling for lab results and to see if he still needs to take his praluent. ? ?

## 2021-10-17 ENCOUNTER — Ambulatory Visit: Payer: Medicare HMO | Admitting: Orthopedic Surgery

## 2021-10-17 ENCOUNTER — Ambulatory Visit (INDEPENDENT_AMBULATORY_CARE_PROVIDER_SITE_OTHER): Payer: Medicare HMO

## 2021-10-17 ENCOUNTER — Encounter: Payer: Self-pay | Admitting: Orthopedic Surgery

## 2021-10-17 DIAGNOSIS — Z01818 Encounter for other preprocedural examination: Secondary | ICD-10-CM | POA: Diagnosis not present

## 2021-10-17 DIAGNOSIS — G8929 Other chronic pain: Secondary | ICD-10-CM

## 2021-10-17 DIAGNOSIS — Z9889 Other specified postprocedural states: Secondary | ICD-10-CM | POA: Diagnosis not present

## 2021-10-17 DIAGNOSIS — Z8781 Personal history of (healed) traumatic fracture: Secondary | ICD-10-CM | POA: Diagnosis not present

## 2021-10-17 DIAGNOSIS — T85848A Pain due to other internal prosthetic devices, implants and grafts, initial encounter: Secondary | ICD-10-CM

## 2021-10-17 DIAGNOSIS — M25561 Pain in right knee: Secondary | ICD-10-CM
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
## 2021-10-17 NOTE — Patient Instructions (Addendum)
You may still have knee pain after the surgery   Your surgery will be at [facility] by Dr Romeo Apple  The hospital will contact you with a preoperative appointment to discuss Anesthesia.  Please arrive on time or 15 minutes early for the preoperative appointment, they have a very tight schedule if you are late or do not come in your surgery will be cancelled.  The phone number is [phone]. Please bring your medications with you for the appointment. They will tell you the arrival time and medication instructions when you have your preoperative evaluation. Do not wear nail polish the day of your surgery and if you take Phentermine you need to stop this medication ONE WEEK prior to your surgery. Please arrive at the hospital 2 hours before procedure if scheduled at 9:30 or later in the day or at the time the nurse tells you at your preoperative visit.   If you have my chart do not use the time given in my chart use the time given to you by the nurse during your preoperative visit.   Your surgery  time may change. Please be available for phone calls the day of your surgery and the day before. The Short Stay department may need to discuss changes about your surgery time. Not reaching the you could lead to procedure delays and possible cancellation.  You must have a ride home and someone to stay with you for 24 to 48 hours. The person taking you home will receive and sign for the your discharge instructions.  Please be prepared to give your support person's name and telephone number to Central Registration. Dr Romeo Apple will need that name and phone number post procedure.

## 2021-10-24 NOTE — Patient Instructions (Signed)
Jeremy Hancock  10/24/2021     @[email or web address] @   Your procedure is scheduled on  10/30/2021.   Report to 12/30/2021 at  1105  A.M.   Call this number if you have problems the morning of surgery:  [phone]   Remember:  Do not eat or drink after midnight.      Take these medicines the morning of surgery with A SIP OF WATER                                             None.     Do not wear jewelry, make-up or nail polish.  Do not wear lotions, powders, or perfumes, or deodorant.  Do not shave 48 hours prior to surgery.  Men may shave face and neck.  Do not bring valuables to the hospital.  [facility] is not responsible for any belongings or valuables.  Contacts, dentures or bridgework may not be worn into surgery.  Leave your suitcase in the car.  After surgery it may be brought to your room.  For patients admitted to the hospital, discharge time will be determined by your treatment team.  Patients discharged the day of surgery will not be allowed to drive home and must have someone with them for 24 hours.    Special instructions:   DO NOT smoke tobacco or vape for 24 hours before your procedure.  Please read over the following fact sheets that you were given. Pain Booklet, Coughing and Deep Breathing, Surgical Site Infection Prevention, Anesthesia Post-op Instructions, and Care and Recovery After Surgery      Orthopedic Hardware Removal, Care After This sheet gives you information about how to care for yourself after your procedure. Your health care provider may also give you more specific instructions. If you have problems or questions, contact your health care provider. What can I expect after the procedure? After the procedure, it is common to have: Soreness or pain. Some swelling in the area where the hardware was removed. A small amount of blood or clear fluid coming from your incision. Follow these instructions at home: If you have a  cast: Do not stick anything inside the cast to scratch your skin. Doing that increases your risk of infection. Check the skin around the cast every day. Tell your health care provider about any concerns. You may put lotion on dry skin around the edges of the cast. Do not put lotion on the skin underneath the cast. Keep the cast clean and dry. If you have a splint or boot: Wear the splint or boot as told by your health care provider. Remove it only as told by your health care provider. Loosen the splint or boot if your fingers or toes tingle, become numb, or turn cold and blue. Keep the splint or boot clean and dry. Bathing Do not take baths, swim, or use a hot tub until your health care provider approves. Ask your health care provider if you may take showers. You may only be allowed to take sponge baths. Keep the bandage (dressing) dry until your health care provider says it can be removed. If your cast, splint, or boot is not waterproof: Do not let it get wet. Cover it with a watertight covering when you take a bath or a shower. Incision care  Follow instructions  from your health care provider about how to take care of your incision. Make sure you: Wash your hands with soap and water before you change your dressing. If soap and water are not available, use hand sanitizer. Change your dressing as told by your health care provider. Leave stitches (sutures), skin glue, or adhesive strips in place. These skin closures may need to stay in place for 2 weeks or longer. If adhesive strip edges start to loosen and curl up, you may trim the loose edges. Do not remove adhesive strips completely unless your health care provider tells you to do that. Check your incision area every day for signs of infection. Check for: Redness. More swelling or pain. More fluid or blood. Warmth. Pus or a bad smell. Managing pain, stiffness, and swelling  If directed, put ice on the affected area: If you have a  removable splint or boot, remove it as told by your health care provider. Put ice in a plastic bag. Place a towel between your skin and the bag. Leave the ice on for 20 minutes, 2-3 times a day. Move your fingers or toes often to avoid stiffness and to lessen swelling. Raise (elevate) the injured area above the level of your heart while you are sitting or lying down. Driving Do not drive or use heavy machinery while taking prescription pain medicine. Do not drive for 24 hours if you were given a medicine to help you relax (sedative) during your procedure. Ask your health care provider when it is safe to drive if you have a cast, splint, or boot on the affected limb. Activity Ask your health care provider what activities are safe for you during recovery, and ask what activities you need to avoid. Do not use the injured limb to support your body weight until your health care provider says that you can. Do not play contact sports until your health care provider approves. Do exercises as told by your health care provider. Avoid sitting for a long time without moving. Get up and move around at least every few hours. This will help prevent blood clots. General instructions Do not put pressure on any part of the cast or splint until it is fully hardened. This may take several hours. If you are taking prescription pain medicine, take actions to prevent or treat constipation. Your health care provider may recommend that you: Drink enough fluid to keep your urine pale yellow. Eat foods that are high in fiber, such as fresh fruits and vegetables, whole grains, and beans. Limit foods that are high in fat and processed sugars, such as fried or sweet foods. Take an over-the-counter or prescription medicine for constipation. Do not use any products that contain nicotine or tobacco, such as cigarettes and e-cigarettes. These can delay bone healing after surgery. If you need help quitting, ask your health care  provider. Take over-the-counter and prescription medicines only as told by your health care provider. Keep all follow-up visits as told by your health care provider. This is important. Contact a health care provider if: You have lasting pain. You have redness around your incision. You have more swelling or pain around your incision. You have more fluid or blood coming from your incision. Your incision feels warm to the touch. You have pus or a bad smell coming from your incision. You are unable to do exercises or physical activity as told by your health care provider. Get help right away if: You have difficulty breathing. You have chest pain.  You have severe pain. You have a fever or chills. You have numbness for more than 24 hours in the area where the hardware was removed. Summary After the procedure, it is common to have some pain and swelling in the area where the hardware was removed. Follow instructions from your health care provider about how to take care of your incision. Return to your normal activities as told by your health care provider. Ask your health care provider what activities are safe for you. This information is not intended to replace advice given to you by your health care provider. Make sure you discuss any questions you have with your health care provider. Document Revised: 06/02/2020 Document Reviewed: 06/02/2020 Elsevier Patient Education  2023 [employer] General Anesthesia, Adult, Care After This sheet gives you information about how to care for yourself after your procedure. Your health care provider may also give you more specific instructions. If you have problems or questions, contact your health care provider. What can I expect after the procedure? After the procedure, the following side effects are common: Pain or discomfort at the IV site. Nausea. Vomiting. Sore throat. Trouble concentrating. Feeling cold or chills. Feeling weak or  tired. Sleepiness and fatigue. Soreness and body aches. These side effects can affect parts of the body that were not involved in surgery. Follow these instructions at home: For the time period you were told by your health care provider:  Rest. Do not participate in activities where you could fall or become injured. Do not drive or use machinery. Do not drink alcohol. Do not take sleeping pills or medicines that cause drowsiness. Do not make important decisions or sign legal documents. Do not take care of children on your own. Eating and drinking Follow any instructions from your health care provider about eating or drinking restrictions. When you feel hungry, start by eating small amounts of foods that are soft and easy to digest (bland), such as toast. Gradually return to your regular diet. Drink enough fluid to keep your urine pale yellow. If you vomit, rehydrate by drinking water, juice, or clear broth. General instructions If you have sleep apnea, surgery and certain medicines can increase your risk for breathing problems. Follow instructions from your health care provider about wearing your sleep device: Anytime you are sleeping, including during daytime naps. While taking prescription pain medicines, sleeping medicines, or medicines that make you drowsy. Have a responsible adult stay with you for the time you are told. It is important to have someone help care for you until you are awake and alert. Return to your normal activities as told by your health care provider. Ask your health care provider what activities are safe for you. Take over-the-counter and prescription medicines only as told by your health care provider. If you smoke, do not smoke without supervision. Keep all follow-up visits as told by your health care provider. This is important. Contact a health care provider if: You have nausea or vomiting that does not get better with medicine. You cannot eat or drink  without vomiting. You have pain that does not get better with medicine. You are unable to pass urine. You develop a skin rash. You have a fever. You have redness around your IV site that gets worse. Get help right away if: You have difficulty breathing. You have chest pain. You have blood in your urine or stool, or you vomit blood. Summary After the procedure, it is common to have a sore throat or nausea. It  is also common to feel tired. Have a responsible adult stay with you for the time you are told. It is important to have someone help care for you until you are awake and alert. When you feel hungry, start by eating small amounts of foods that are soft and easy to digest (bland), such as toast. Gradually return to your regular diet. Drink enough fluid to keep your urine pale yellow. Return to your normal activities as told by your health care provider. Ask your health care provider what activities are safe for you. This information is not intended to replace advice given to you by your health care provider. Make sure you discuss any questions you have with your health care provider. Document Revised: 12/02/2019 Document Reviewed: 07/01/2019 Elsevier Patient Education  2023 [employer] How to Use Chlorhexidine for Bathing Chlorhexidine gluconate (CHG) is a germ-killing (antiseptic) solution that is used to clean the skin. It can get rid of the bacteria that normally live on the skin and can keep them away for about 24 hours. To clean your skin with CHG, you may be given: A CHG solution to use in the shower or as part of a sponge bath. A prepackaged cloth that contains CHG. Cleaning your skin with CHG may help lower the risk for infection: While you are staying in the intensive care unit of the hospital. If you have a vascular access, such as a central line, to provide short-term or long-term access to your veins. If you have a catheter to drain urine from your bladder. If you are on a  ventilator. A ventilator is a machine that helps you breathe by moving air in and out of your lungs. After surgery. What are the risks? Risks of using CHG include: A skin reaction. Hearing loss, if CHG gets in your ears and you have a perforated eardrum. Eye injury, if CHG gets in your eyes and is not rinsed out. The CHG product catching fire. Make sure that you avoid smoking and flames after applying CHG to your skin. Do not use CHG: If you have a chlorhexidine allergy or have previously reacted to chlorhexidine. On babies younger than 12 months of age. How to use CHG solution Use CHG only as told by your health care provider, and follow the instructions on the label. Use the full amount of CHG as directed. Usually, this is one bottle. During a shower Follow these steps when using CHG solution during a shower (unless your health care provider gives you different instructions): Start the shower. Use your normal soap and shampoo to wash your face and hair. Turn off the shower or move out of the shower stream. Pour the CHG onto a clean washcloth. Do not use any type of brush or rough-edged sponge. Starting at your neck, lather your body down to your toes. Make sure you follow these instructions: If you will be having surgery, pay special attention to the part of your body where you will be having surgery. Scrub this area for at least 1 minute. Do not use CHG on your head or face. If the solution gets into your ears or eyes, rinse them well with water. Avoid your genital area. Avoid any areas of skin that have broken skin, cuts, or scrapes. Scrub your back and under your arms. Make sure to wash skin folds. Let the lather sit on your skin for 1-2 minutes or as long as told by your health care provider. Thoroughly rinse your entire body in the  shower. Make sure that all body creases and crevices are rinsed well. Dry off with a clean towel. Do not put any substances on your body afterward--such  as powder, lotion, or perfume--unless you are told to do so by your health care provider. Only use lotions that are recommended by the manufacturer. Put on clean clothes or pajamas. If it is the night before your surgery, sleep in clean sheets.  During a sponge bath Follow these steps when using CHG solution during a sponge bath (unless your health care provider gives you different instructions): Use your normal soap and shampoo to wash your face and hair. Pour the CHG onto a clean washcloth. Starting at your neck, lather your body down to your toes. Make sure you follow these instructions: If you will be having surgery, pay special attention to the part of your body where you will be having surgery. Scrub this area for at least 1 minute. Do not use CHG on your head or face. If the solution gets into your ears or eyes, rinse them well with water. Avoid your genital area. Avoid any areas of skin that have broken skin, cuts, or scrapes. Scrub your back and under your arms. Make sure to wash skin folds. Let the lather sit on your skin for 1-2 minutes or as long as told by your health care provider. Using a different clean, wet washcloth, thoroughly rinse your entire body. Make sure that all body creases and crevices are rinsed well. Dry off with a clean towel. Do not put any substances on your body afterward--such as powder, lotion, or perfume--unless you are told to do so by your health care provider. Only use lotions that are recommended by the manufacturer. Put on clean clothes or pajamas. If it is the night before your surgery, sleep in clean sheets. How to use CHG prepackaged cloths Only use CHG cloths as told by your health care provider, and follow the instructions on the label. Use the CHG cloth on clean, dry skin. Do not use the CHG cloth on your head or face unless your health care provider tells you to. When washing with the CHG cloth: Avoid your genital area. Avoid any areas of skin  that have broken skin, cuts, or scrapes. Before surgery Follow these steps when using a CHG cloth to clean before surgery (unless your health care provider gives you different instructions): Using the CHG cloth, vigorously scrub the part of your body where you will be having surgery. Scrub using a back-and-forth motion for 3 minutes. The area on your body should be completely wet with CHG when you are done scrubbing. Do not rinse. Discard the cloth and let the area air-dry. Do not put any substances on the area afterward, such as powder, lotion, or perfume. Put on clean clothes or pajamas. If it is the night before your surgery, sleep in clean sheets.  For general bathing Follow these steps when using CHG cloths for general bathing (unless your health care provider gives you different instructions). Use a separate CHG cloth for each area of your body. Make sure you wash between any folds of skin and between your fingers and toes. Wash your body in the following order, switching to a new cloth after each step: The front of your neck, shoulders, and chest. Both of your arms, under your arms, and your hands. Your stomach and groin area, avoiding the genitals. Your right leg and foot. Your left leg and foot. The back of your neck,  your back, and your buttocks. Do not rinse. Discard the cloth and let the area air-dry. Do not put any substances on your body afterward--such as powder, lotion, or perfume--unless you are told to do so by your health care provider. Only use lotions that are recommended by the manufacturer. Put on clean clothes or pajamas. Contact a health care provider if: Your skin gets irritated after scrubbing. You have questions about using your solution or cloth. You swallow any chlorhexidine. Call your local poison control center ([phone] in the [place] Get help right away if: Your eyes itch badly, or they become very red or swollen. Your skin itches badly and is red or  swollen. Your hearing changes. You have trouble seeing. You have swelling or tingling in your mouth or throat. You have trouble breathing. These symptoms may represent a serious problem that is an emergency. Do not wait to see if the symptoms will go away. Get medical help right away. Call your local emergency services (911 in the [place] Do not drive yourself to the hospital. Summary Chlorhexidine gluconate (CHG) is a germ-killing (antiseptic) solution that is used to clean the skin. Cleaning your skin with CHG may help to lower your risk for infection. You may be given CHG to use for bathing. It may be in a bottle or in a prepackaged cloth to use on your skin. Carefully follow your health care provider's instructions and the instructions on the product label. Do not use CHG if you have a chlorhexidine allergy. Contact your health care provider if your skin gets irritated after scrubbing. This information is not intended to replace advice given to you by your health care provider. Make sure you discuss any questions you have with your health care provider. Document Revised: 05/29/2020 Document Reviewed: 05/29/2020 Elsevier Patient Education  2023 [employer].

## 2021-10-26 ENCOUNTER — Other Ambulatory Visit: Payer: Self-pay

## 2021-10-26 ENCOUNTER — Encounter (HOSPITAL_COMMUNITY)
Admission: RE | Admit: 2021-10-26 | Discharge: 2021-10-26 | Disposition: A | Discharge status: Home / Self Care | Payer: Medicare HMO | Source: Ambulatory Visit | Attending: Orthopedic Surgery | Admitting: Orthopedic Surgery

## 2021-10-26 ENCOUNTER — Encounter (HOSPITAL_COMMUNITY): Payer: Self-pay

## 2021-10-26 DIAGNOSIS — Z01818 Encounter for other preprocedural examination: Secondary | ICD-10-CM

## 2021-10-26 DIAGNOSIS — Z8781 Personal history of (healed) traumatic fracture: Secondary | ICD-10-CM | POA: Diagnosis not present

## 2021-10-26 DIAGNOSIS — T85848A Pain due to other internal prosthetic devices, implants and grafts, initial encounter: Secondary | ICD-10-CM | POA: Insufficient documentation

## 2021-10-26 DIAGNOSIS — Z9889 Other specified postprocedural states: Secondary | ICD-10-CM | POA: Diagnosis not present

## 2021-10-26 DIAGNOSIS — Y828 Other medical devices associated with adverse incidents: Secondary | ICD-10-CM | POA: Diagnosis not present

## 2021-10-26 DIAGNOSIS — G8929 Other chronic pain: Secondary | ICD-10-CM | POA: Diagnosis not present

## 2021-10-26 DIAGNOSIS — Z01812 Encounter for preprocedural laboratory examination: Secondary | ICD-10-CM | POA: Insufficient documentation

## 2021-10-26 DIAGNOSIS — M25561 Pain in right knee: Secondary | ICD-10-CM | POA: Insufficient documentation

## 2021-10-26 LAB — CBC WITH DIFFERENTIAL/PLATELET
Abs Immature Granulocytes: 0.07 10*3/uL (ref 0.00–0.07)
Basophils Absolute: 0.1 10*3/uL (ref 0.0–0.1)
Basophils Relative: 1 %
Eosinophils Absolute: 0.5 10*3/uL (ref 0.0–0.5)
Eosinophils Relative: 7 %
HCT: 47.8 % (ref 39.0–52.0)
Hemoglobin: 16.3 g/dL (ref 13.0–17.0)
Immature Granulocytes: 1 %
Lymphocytes Relative: 27 %
Lymphs Abs: 2.1 10*3/uL (ref 0.7–4.0)
MCH: 30.9 pg (ref 26.0–34.0)
MCHC: 34.1 g/dL (ref 30.0–36.0)
MCV: 90.5 fL (ref 80.0–100.0)
Monocytes Absolute: 0.5 10*3/uL (ref 0.1–1.0)
Monocytes Relative: 7 %
Neutro Abs: 4.7 10*3/uL (ref 1.7–7.7)
Neutrophils Relative %: 57 %
Platelets: 286 10*3/uL (ref 150–400)
RBC: 5.28 MIL/uL (ref 4.22–5.81)
RDW: 14.1 % (ref 11.5–15.5)
WBC: 8 10*3/uL (ref 4.0–10.5)
nRBC: 0 % (ref 0.0–0.2)

## 2021-10-26 LAB — BASIC METABOLIC PANEL
Anion gap: 7 (ref 5–15)
BUN: 11 mg/dL (ref 6–20)
CO2: 24 mmol/L (ref 22–32)
Calcium: 8.7 mg/dL — ABNORMAL LOW (ref 8.9–10.3)
Chloride: 108 mmol/L (ref 98–111)
Creatinine, Ser: 1.06 mg/dL (ref 0.61–1.24)
GFR, Estimated: >60 mL/min (ref >60)
Glucose, Bld: 118 mg/dL — ABNORMAL HIGH (ref 70–99)
Potassium: 3.5 mmol/L (ref 3.5–5.1)
Sodium: 139 mmol/L (ref 135–145)
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
## 2021-10-30 ENCOUNTER — Ambulatory Visit (HOSPITAL_COMMUNITY): Payer: Medicare HMO

## 2021-10-30 ENCOUNTER — Ambulatory Visit (HOSPITAL_COMMUNITY)
Admission: RE | Admit: 2021-10-30 | Discharge: 2021-10-30 | Disposition: A | Discharge status: Home / Self Care | Payer: Medicare HMO | Attending: Orthopedic Surgery | Admitting: Orthopedic Surgery

## 2021-10-30 ENCOUNTER — Encounter (HOSPITAL_COMMUNITY)
Admission: RE | Disposition: A | Discharge status: Home / Self Care | Payer: Self-pay | Source: Home / Self Care | Attending: Orthopedic Surgery

## 2021-10-30 ENCOUNTER — Encounter (HOSPITAL_COMMUNITY): Payer: Self-pay | Admitting: Orthopedic Surgery

## 2021-10-30 ENCOUNTER — Ambulatory Visit (HOSPITAL_BASED_OUTPATIENT_CLINIC_OR_DEPARTMENT_OTHER): Payer: Medicare HMO

## 2021-10-30 ENCOUNTER — Other Ambulatory Visit: Payer: Self-pay

## 2021-10-30 DIAGNOSIS — T85848A Pain due to other internal prosthetic devices, implants and grafts, initial encounter: Secondary | ICD-10-CM

## 2021-10-30 DIAGNOSIS — T8484XA Pain due to internal orthopedic prosthetic devices, implants and grafts, initial encounter: Secondary | ICD-10-CM | POA: Insufficient documentation

## 2021-10-30 DIAGNOSIS — M7989 Other specified soft tissue disorders: Secondary | ICD-10-CM | POA: Diagnosis not present

## 2021-10-30 DIAGNOSIS — Y793 Surgical instruments, materials and orthopedic devices (including sutures) associated with adverse incidents: Secondary | ICD-10-CM | POA: Insufficient documentation

## 2021-10-30 DIAGNOSIS — Y831 Surgical operation with implant of artificial internal device as the cause of abnormal reaction of the patient, or of later complication, without mention of misadventure at the time of the procedure: Secondary | ICD-10-CM | POA: Diagnosis not present

## 2021-10-30 DIAGNOSIS — F17210 Nicotine dependence, cigarettes, uncomplicated: Secondary | ICD-10-CM | POA: Insufficient documentation

## 2021-10-30 DIAGNOSIS — Z472 Encounter for removal of internal fixation device: Secondary | ICD-10-CM | POA: Diagnosis not present

## 2021-10-30 DIAGNOSIS — T85848D Pain due to other internal prosthetic devices, implants and grafts, subsequent encounter: Secondary | ICD-10-CM

## 2021-10-30 DIAGNOSIS — R69 Illness, unspecified: Secondary | ICD-10-CM | POA: Diagnosis not present

## 2021-10-30 HISTORY — PX: HARDWARE REMOVAL: SHX979

## 2021-10-30 SURGERY — REMOVAL, HARDWARE
Anesthesia: General | Site: Knee | Laterality: Right

## 2021-10-30 MED ORDER — DEXAMETHASONE SODIUM PHOSPHATE 10 MG/ML IJ SOLN
INTRAMUSCULAR | Status: DC | PRN
  Administered 2021-10-30: 10 mg via INTRAVENOUS

## 2021-10-30 MED ORDER — ONDANSETRON HCL 4 MG/2ML IJ SOLN
INTRAMUSCULAR | Status: AC
  Filled 2021-10-30: qty 2

## 2021-10-30 MED ORDER — BUPIVACAINE-EPINEPHRINE 0.5% -1:200000 IJ SOLN
INTRAMUSCULAR | Status: DC | PRN
  Administered 2021-10-30: 30 mL

## 2021-10-30 MED ORDER — FENTANYL CITRATE PF 50 MCG/ML IJ SOSY: 25.0000 ug | PREFILLED_SYRINGE | INTRAMUSCULAR | Status: DC | PRN

## 2021-10-30 MED ORDER — OXYCODONE HCL 5 MG PO TABS: ORAL_TABLET | ORAL | 0 refills | Status: DC

## 2021-10-30 MED ORDER — LIDOCAINE HCL (PF) 2 % IJ SOLN
INTRAMUSCULAR | Status: AC
  Filled 2021-10-30: qty 5

## 2021-10-30 MED ORDER — LIDOCAINE 2% (20 MG/ML) 5 ML SYRINGE
INTRAMUSCULAR | Status: DC | PRN
  Administered 2021-10-30: 60 mg via INTRAVENOUS

## 2021-10-30 MED ORDER — MIDAZOLAM HCL 2 MG/2ML IJ SOLN
INTRAMUSCULAR | Status: AC
  Filled 2021-10-30: qty 2

## 2021-10-30 MED ORDER — ONDANSETRON HCL 4 MG/2ML IJ SOLN: 4.0000 mg | Freq: Once | INTRAMUSCULAR | Status: DC | PRN

## 2021-10-30 MED ORDER — VANCOMYCIN HCL IN DEXTROSE 1-5 GM/200ML-% IV SOLN
1000.0000 mg | INTRAVENOUS | Status: AC
  Administered 2021-10-30: 1000 mg via INTRAVENOUS

## 2021-10-30 MED ORDER — DEXAMETHASONE SODIUM PHOSPHATE 10 MG/ML IJ SOLN
INTRAMUSCULAR | Status: AC
  Filled 2021-10-30: qty 1

## 2021-10-30 MED ORDER — VANCOMYCIN HCL IN DEXTROSE 1-5 GM/200ML-% IV SOLN
INTRAVENOUS | Status: AC
  Filled 2021-10-30: qty 200

## 2021-10-30 MED ORDER — EPHEDRINE SULFATE-NACL 50-0.9 MG/10ML-% IV SOSY
PREFILLED_SYRINGE | INTRAVENOUS | Status: DC | PRN
  Administered 2021-10-30: 5 mg via INTRAVENOUS

## 2021-10-30 MED ORDER — PROPOFOL 10 MG/ML IV BOLUS
INTRAVENOUS | Status: DC | PRN
  Administered 2021-10-30: 130 mg via INTRAVENOUS

## 2021-10-30 MED ORDER — BUPIVACAINE-EPINEPHRINE (PF) 0.5% -1:200000 IJ SOLN
INTRAMUSCULAR | Status: AC
  Filled 2021-10-30: qty 30

## 2021-10-30 MED ORDER — FENTANYL CITRATE (PF) 100 MCG/2ML IJ SOLN
INTRAMUSCULAR | Status: AC
  Filled 2021-10-30: qty 2

## 2021-10-30 MED ORDER — MIDAZOLAM HCL 5 MG/5ML IJ SOLN
INTRAMUSCULAR | Status: DC | PRN
  Administered 2021-10-30: 2 mg via INTRAVENOUS

## 2021-10-30 MED ORDER — EPHEDRINE 5 MG/ML INJ
INTRAVENOUS | Status: AC
  Filled 2021-10-30: qty 5

## 2021-10-30 MED ORDER — ONDANSETRON HCL 4 MG/2ML IJ SOLN: 4.0000 mg | Freq: Once | INTRAMUSCULAR | Status: DC

## 2021-10-30 MED ORDER — LACTATED RINGERS IV SOLN: INTRAVENOUS | Status: DC | PRN

## 2021-10-30 MED ORDER — ONDANSETRON HCL 4 MG/2ML IJ SOLN
INTRAMUSCULAR | Status: DC | PRN
  Administered 2021-10-30: 4 mg via INTRAVENOUS

## 2021-10-30 MED ORDER — SODIUM CHLORIDE 0.9 % IR SOLN
Status: DC | PRN
  Administered 2021-10-30: 1000 mL

## 2021-10-30 MED ORDER — BUPIVACAINE HCL (PF) 0.5 % IJ SOLN
INTRAMUSCULAR | Status: AC
  Filled 2021-10-30: qty 30

## 2021-10-30 MED ORDER — LACTATED RINGERS IV SOLN: INTRAVENOUS | Status: DC

## 2021-10-30 MED ORDER — CHLORHEXIDINE GLUCONATE 0.12 % MT SOLN: 15.0000 mL | Freq: Once | OROMUCOSAL | Status: DC

## 2021-10-30 MED ORDER — FENTANYL CITRATE (PF) 100 MCG/2ML IJ SOLN
INTRAMUSCULAR | Status: DC | PRN
Start: 2021-10-30 — End: 2021-10-30
  Administered 2021-10-30 (×2): 25 ug via INTRAVENOUS

## 2021-10-30 MED ORDER — OXYCODONE HCL 5 MG PO TABS
10.0000 mg | ORAL_TABLET | Freq: Once | ORAL | Status: AC
  Administered 2021-10-30: 10 mg via ORAL
  Filled 2021-10-30: qty 2

## 2021-10-30 MED ORDER — ORAL CARE MOUTH RINSE: 15.0000 mL | Freq: Once | OROMUCOSAL | Status: DC

## 2021-10-30 MED ORDER — PROPOFOL 10 MG/ML IV BOLUS
INTRAVENOUS | Status: AC
  Filled 2021-10-30: qty 20

## 2021-10-30 SURGICAL SUPPLY — 52 items
APL PRP STRL LF DISP 70% ISPRP (MISCELLANEOUS) ×1
APL SKNCLS STERI-STRIP NONHPOA (GAUZE/BANDAGES/DRESSINGS) ×1
BANDAGE ELASTIC 6 VELCRO ST LF (GAUZE/BANDAGES/DRESSINGS) ×1 IMPLANT
BANDAGE ESMARK 4X12 BL STRL LF (DISPOSABLE) ×2 IMPLANT
BANDAGE ESMARK 6X9 LF (GAUZE/BANDAGES/DRESSINGS) IMPLANT
BENZOIN TINCTURE PRP APPL 2/3 (GAUZE/BANDAGES/DRESSINGS) ×1 IMPLANT
BLADE SURG SZ10 CARB STEEL (BLADE) ×2 IMPLANT
BNDG CMPR 12X4 ELC STRL LF (DISPOSABLE) ×1
BNDG CMPR 9X6 STRL LF SNTH (GAUZE/BANDAGES/DRESSINGS) ×1
BNDG CMPR STD VLCR NS LF 5.8X3 (GAUZE/BANDAGES/DRESSINGS)
BNDG CMPR STD VLCR NS LF 5.8X6 (GAUZE/BANDAGES/DRESSINGS)
BNDG COHESIVE 4X5 TAN ST LF (GAUZE/BANDAGES/DRESSINGS) ×1 IMPLANT
BNDG ELASTIC 3X5.8 VLCR NS LF (GAUZE/BANDAGES/DRESSINGS) IMPLANT
BNDG ELASTIC 6X5.8 VLCR NS LF (GAUZE/BANDAGES/DRESSINGS) IMPLANT
BNDG ESMARK 4X12 BLUE STRL LF (DISPOSABLE) ×2
BNDG ESMARK 6X9 LF (GAUZE/BANDAGES/DRESSINGS) ×2
BNDG GAUZE ELAST 4 BULKY (GAUZE/BANDAGES/DRESSINGS) ×1 IMPLANT
CHLORAPREP W/TINT 26 (MISCELLANEOUS) ×3 IMPLANT
CLOSURE STERI STRIP 1/2 X4 (GAUZE/BANDAGES/DRESSINGS) ×2 IMPLANT
CLOTH BEACON ORANGE TIMEOUT ST (SAFETY) ×3 IMPLANT
COVER LIGHT HANDLE STERIS (MISCELLANEOUS) ×6 IMPLANT
CUFF TOURN SGL QUICK 34 (TOURNIQUET CUFF) ×2
CUFF TRNQT CYL 34X4.125X (TOURNIQUET CUFF) IMPLANT
DECANTER SPIKE VIAL GLASS SM (MISCELLANEOUS) ×3 IMPLANT
DRESSING XEROFORM 5X9 (GAUZE/BANDAGES/DRESSINGS) ×1 IMPLANT
ELECT REM PT RETURN 9FT ADLT (ELECTROSURGICAL) ×2
ELECTRODE REM PT RTRN 9FT ADLT (ELECTROSURGICAL) ×2 IMPLANT
GAUZE SPONGE 4X4 12PLY STRL LF (GAUZE/BANDAGES/DRESSINGS) ×1 IMPLANT
GAUZE XEROFORM 5X9 LF (GAUZE/BANDAGES/DRESSINGS) ×1 IMPLANT
GLOVE BIOGEL PI IND STRL 7.0 (GLOVE) ×4 IMPLANT
GLOVE BIOGEL PI INDICATOR 7.0 (GLOVE) ×2
GLOVE SS N UNI LF 8.5 STRL (GLOVE) ×3 IMPLANT
GLOVE SURG POLYISO LF SZ8 (GLOVE) ×3 IMPLANT
GOWN STRL REUS W/TWL LRG LVL3 (GOWN DISPOSABLE) ×3 IMPLANT
GOWN STRL REUS W/TWL XL LVL3 (GOWN DISPOSABLE) ×3 IMPLANT
INST SET MINOR BONE (KITS) ×3 IMPLANT
KIT TURNOVER KIT A (KITS) ×3 IMPLANT
MANIFOLD NEPTUNE II (INSTRUMENTS) ×3 IMPLANT
NDL HYPO 21X1.5 SAFETY (NEEDLE) ×2 IMPLANT
NEEDLE HYPO 21X1.5 SAFETY (NEEDLE) ×2 IMPLANT
NS IRRIG 1000ML POUR BTL (IV SOLUTION) ×3 IMPLANT
PACK BASIC LIMB (CUSTOM PROCEDURE TRAY) ×3 IMPLANT
PAD ABD 5X9 TENDERSORB (GAUZE/BANDAGES/DRESSINGS) ×1 IMPLANT
PAD ARMBOARD 7.5X6 YLW CONV (MISCELLANEOUS) ×3 IMPLANT
SET BASIN LINEN APH (SET/KITS/TRAYS/PACK) ×3 IMPLANT
SPONGE T-LAP 18X18 ~~LOC~~+RFID (SPONGE) ×3 IMPLANT
SUT MON AB 0 CT1 (SUTURE) ×1 IMPLANT
SUT MON AB 2-0 CT1 36 (SUTURE) ×1 IMPLANT
SUT VIC AB 1 CT1 27 (SUTURE) ×2
SUT VIC AB 1 CT1 27XBRD ANTBC (SUTURE) IMPLANT
SYR 30ML LL (SYRINGE) ×3 IMPLANT
SYR BULB IRRIG 60ML STRL (SYRINGE) ×6 IMPLANT

## 2021-10-30 NOTE — Interval H&P Note (Signed)
History and Physical Interval Note:  10/30/2021 1:20 PM  Jeremy Hancock  has presented today for surgery, with the diagnosis of Painful retained hardware right patella.  The various methods of treatment have been discussed with the patient and family. After consideration of risks, benefits and other options for treatment, the patient has consented to  Procedure(s): HARDWARE REMOVAL RIGHT PATELLA (Right) as a surgical intervention.  The patient's history has been reviewed, patient examined, no change in status, stable for surgery.  I have reviewed the patient's chart and labs.  Questions were answered to the patient's satisfaction.     Fuller Canada

## 2021-10-30 NOTE — Brief Op Note (Signed)
10/30/2021  2:51 PM  PATIENT:  Jeremy Hancock  51 y.o. male  PRE-OPERATIVE DIAGNOSIS:  Painful retained hardware right patella  POST-OPERATIVE DIAGNOSIS:  Painful retained hardware right patella  PROCEDURE:  Procedure(s): HARDWARE REMOVAL RIGHT PATELLA (Right)  Findings full range of motion of the knee no popping no clicking no instability  Intact hardware all removed including 1 kn of #5 Ethibond suture  Procedure Jeremy Hancock was cleared for surgery with evaluation in the preop area.  Imaging reviewed.  Patient taken to surgery.  Patient anesthetized with general anesthesia with LMA.  Right leg was prepped and draped sterilely.  Exam under anesthesia was done.  Timeout was completed.  The limb was exsanguinated with a 6 inch Esmarch tourniquet was elevated to 275 mmHg.  The same incision was used as in the prior surgery.  We did use all of the length of the incision.  We started just above the patella and just below it we divided the subcutaneous tissue down to the patella we created medial and lateral flaps.  We did a subperiosteal dissection and found the 3 K wires and remove them we then remove the figure-of-eight wire and then I saw a piece of #5 Ethibond suture which was knotted and I remove that.  Thorough irrigation was performed  Closure was performed by closing periosteum with interrupted #1 Vicryl followed by 0 Monocryl and then 2-0 Monocryl in a running subcuticular stitch.  Steri-Strips and benzoin were used.  We injected a total of 30 cc of Marcaine with epinephrine around the subcutaneous tissues  Sterile dressings were applied tourniquet was released Ace bandage was applied  Postop plan wound check in 2 weeks Weight-bear as tolerated He can have 10 mg of oxycodone week 1 days 1 through 5 And then he can have 5 mg days 6 through 14  Postop visit wound check in 2 weeks  SURGEON:  Surgeon(s) and Role:    * Annie Roseboom E, MD - Primary  PHYSICIAN ASSISTANT:    ASSISTANTS: none   ANESTHESIA:   general  EBL:  5 mL   BLOOD ADMINISTERED:none  DRAINS: none   LOCAL MEDICATIONS USED:  MARCAINE     SPECIMEN:  No Specimen  DISPOSITION OF SPECIMEN:  N/A  COUNTS:  YES  TOURNIQUET:   Total Tourniquet Time Documented: Thigh (Right) - 48 minutes Total: Thigh (Right) - 48 minutes   DICTATION: .Dragon Dictation  PLAN OF CARE: Discharge to home after PACU  PATIENT DISPOSITION:  PACU - hemodynamically stable.   Delay start of Pharmacological VTE agent (>24hrs) due to surgical blood loss or risk of bleeding: not applicable  

## 2021-10-30 NOTE — Anesthesia Procedure Notes (Signed)
Procedure Name: LMA Insertion Date/Time: 10/30/2021 1:44 PM  Performed by: Pearson Grippe, CRNAPre-anesthesia Checklist: Patient identified, Emergency Drugs available, Suction available and Patient being monitored Patient Re-evaluated:Patient Re-evaluated prior to induction Oxygen Delivery Method: Circle system utilized Preoxygenation: Pre-oxygenation with 100% oxygen Induction Type: IV induction Ventilation: Mask ventilation without difficulty LMA: LMA inserted LMA Size: 4.0 Number of attempts: 1 Placement Confirmation: positive ETCO2 Tube secured with: Tape Dental Injury: Teeth and Oropharynx as per pre-operative assessment

## 2021-10-30 NOTE — Op Note (Signed)
10/30/2021  2:51 PM  PATIENT:  Jeremy Hancock  51 y.o. male  PRE-OPERATIVE DIAGNOSIS:  Painful retained hardware right patella  POST-OPERATIVE DIAGNOSIS:  Painful retained hardware right patella  PROCEDURE:  Procedure(s): HARDWARE REMOVAL RIGHT PATELLA (Right)- [id number]  Findings full range of motion of the knee no popping no clicking no instability  Intact hardware all removed including 1 kn of #5 Ethibond suture  Procedure Mr. Cropper was cleared for surgery with evaluation in the preop area.  Imaging reviewed.  Patient taken to surgery.  Patient anesthetized with general anesthesia with LMA.  Right leg was prepped and draped sterilely.  Exam under anesthesia was done.  Timeout was completed.  The limb was exsanguinated with a 6 inch Esmarch tourniquet was elevated to 275 mmHg.  The same incision was used as in the prior surgery.  We did use all of the length of the incision.  We started just above the patella and just below it we divided the subcutaneous tissue down to the patella we created medial and lateral flaps.  We did a subperiosteal dissection and found the 3 K wires and remove them we then remove the figure-of-eight wire and then I saw a piece of #5 Ethibond suture which was knotted and I remove that.  Thorough irrigation was performed  Closure was performed by closing periosteum with interrupted #1 Vicryl followed by 0 Monocryl and then 2-0 Monocryl in a running subcuticular stitch.  Steri-Strips and benzoin were used.  We injected a total of 30 cc of Marcaine with epinephrine around the subcutaneous tissues  Sterile dressings were applied tourniquet was released Ace bandage was applied  Postop plan wound check in 2 weeks Weight-bear as tolerated He can have 10 mg of oxycodone week 1 days 1 through 5 And then he can have 5 mg days 6 through 14  Postop visit wound check in 2 weeks  SURGEON:  Surgeon(s) and Role:    * Vickki Hearing, MD - Primary  PHYSICIAN  ASSISTANT:   ASSISTANTS: none   ANESTHESIA:   general  EBL:  5 mL   BLOOD ADMINISTERED:none  DRAINS: none   LOCAL MEDICATIONS USED:  MARCAINE     SPECIMEN:  No Specimen  DISPOSITION OF SPECIMEN:  N/A  COUNTS:  YES  TOURNIQUET:   Total Tourniquet Time Documented: Thigh (Right) - 48 minutes Total: Thigh (Right) - 48 minutes   DICTATION: .Reubin Milan Dictation  PLAN OF CARE: Discharge to home after PACU  PATIENT DISPOSITION:  PACU - hemodynamically stable.   Delay start of Pharmacological VTE agent (>24hrs) due to surgical blood loss or risk of bleeding: not applicable

## 2021-10-30 NOTE — Anesthesia Preprocedure Evaluation (Signed)
Anesthesia Evaluation  Patient identified by MRN, date of birth, ID band Patient awake    Reviewed: Allergy & Precautions, H&P , NPO status , Patient's Chart, lab work & pertinent test results, reviewed documented beta blocker date and time   Airway Mallampati: II  TM Distance: >3 FB Neck ROM: full    Dental no notable dental hx.    Pulmonary neg pulmonary ROS, Current Smoker and Patient abstained from smoking.,    Pulmonary exam normal breath sounds clear to auscultation       Cardiovascular Exercise Tolerance: Good + CAD   Rhythm:regular Rate:Normal     Neuro/Psych  Neuromuscular disease negative psych ROS   GI/Hepatic negative GI ROS, Neg liver ROS,   Endo/Other  negative endocrine ROS  Renal/GU negative Renal ROS  negative genitourinary   Musculoskeletal   Abdominal   Peds  Hematology negative hematology ROS (+)   Anesthesia Other Findings   Reproductive/Obstetrics negative OB ROS                             Anesthesia Physical Anesthesia Plan  ASA: 2  Anesthesia Plan: General and General LMA   Post-op Pain Management:    Induction:   PONV Risk Score and Plan: Ondansetron  Airway Management Planned:   Additional Equipment:   Intra-op Plan:   Post-operative Plan:   Informed Consent: I have reviewed the patients History and Physical, chart, labs and discussed the procedure including the risks, benefits and alternatives for the proposed anesthesia with the patient or authorized representative who has indicated his/her understanding and acceptance.     Dental Advisory Given  Plan Discussed with: CRNA  Anesthesia Plan Comments:         Anesthesia Quick Evaluation

## 2021-10-30 NOTE — Transfer of Care (Signed)
Immediate Anesthesia Transfer of Care Note  Patient: Jeremy Hancock  Procedure(s) Performed: HARDWARE REMOVAL RIGHT PATELLA (Right: Knee)  Patient Location: PACU  Anesthesia Type:General  Level of Consciousness: awake, alert  and oriented  Airway & Oxygen Therapy: Patient Spontanous Breathing and Patient connected to nasal cannula oxygen  Post-op Assessment: Report given to RN and Post -op Vital signs reviewed and stable  Post vital signs: Reviewed and stable  Last Vitals:  Vitals Value Taken Time  BP 115/72 10/30/21 1456  Temp 97.6   Pulse 65 10/30/21 1457  Resp 16 10/30/21 1457  SpO2 98 % 10/30/21 1457  Vitals shown include unvalidated device data.  Last Pain:  Vitals:   10/30/21 1205  TempSrc: Oral  PainSc: 5       Patients Stated Pain Goal: 5 (10/30/21 1205)  Complications: No notable events documented.

## 2021-10-30 NOTE — Op Note (Signed)
10/30/2021  2:51 PM  PATIENT:  Jeremy Hancock  51 y.o. male  PRE-OPERATIVE DIAGNOSIS:  Painful retained hardware right patella  POST-OPERATIVE DIAGNOSIS:  Painful retained hardware right patella  PROCEDURE:  Procedure(s): HARDWARE REMOVAL RIGHT PATELLA (Right)  Findings full range of motion of the knee no popping no clicking no instability  Intact hardware all removed including 1 kn of #5 Ethibond suture  Procedure [name] was cleared for surgery with evaluation in the preop area.  Imaging reviewed.  Patient taken to surgery.  Patient anesthetized with general anesthesia with LMA.  Right leg was prepped and draped sterilely.  Exam under anesthesia was done.  Timeout was completed.  The limb was exsanguinated with a 6 inch Esmarch tourniquet was elevated to 275 mmHg.  The same incision was used as in the prior surgery.  We did use all of the length of the incision.  We started just above the patella and just below it we divided the subcutaneous tissue down to the patella we created medial and lateral flaps.  We did a subperiosteal dissection and found the 3 K wires and remove them we then remove the figure-of-eight wire and then I saw a piece of #5 Ethibond suture which was knotted and I remove that.  Thorough irrigation was performed  Closure was performed by closing periosteum with interrupted #1 Vicryl followed by 0 Monocryl and then 2-0 Monocryl in a running subcuticular stitch.  Steri-Strips and benzoin were used.  We injected a total of 30 cc of Marcaine with epinephrine around the subcutaneous tissues  Sterile dressings were applied tourniquet was released Ace bandage was applied  Postop plan wound check in 2 weeks Weight-bear as tolerated He can have 10 mg of oxycodone week 1 days 1 through 5 And then he can have 5 mg days 6 through 14  Postop visit wound check in 2 weeks  SURGEON:  Surgeon(s) and Role:    * Vickki Hearing, MD - Primary  PHYSICIAN ASSISTANT:    ASSISTANTS: none   ANESTHESIA:   general  EBL:  5 mL   BLOOD ADMINISTERED:none  DRAINS: none   LOCAL MEDICATIONS USED:  MARCAINE     SPECIMEN:  No Specimen  DISPOSITION OF SPECIMEN:  N/A  COUNTS:  YES  TOURNIQUET:   Total Tourniquet Time Documented: Thigh (Right) - 48 minutes Total: Thigh (Right) - 48 minutes   DICTATION: .Reubin Milan Dictation  PLAN OF CARE: Discharge to home after PACU  PATIENT DISPOSITION:  PACU - hemodynamically stable.   Delay start of Pharmacological VTE agent (>24hrs) due to surgical blood loss or risk of bleeding: not applicable

## 2021-10-31 ENCOUNTER — Encounter (HOSPITAL_COMMUNITY): Payer: Self-pay | Admitting: Orthopedic Surgery

## 2021-10-31 NOTE — Anesthesia Postprocedure Evaluation (Signed)
Anesthesia Post Note  Patient: Jeremy Hancock  Procedure(s) Performed: HARDWARE REMOVAL RIGHT PATELLA (Right: Knee)  Patient location during evaluation: Phase II Anesthesia Type: General Level of consciousness: awake Pain management: pain level controlled Vital Signs Assessment: post-procedure vital signs reviewed and stable Respiratory status: spontaneous breathing and respiratory function stable Cardiovascular status: blood pressure returned to baseline and stable Postop Assessment: no headache and no apparent nausea or vomiting Anesthetic complications: no Comments: Late entry   No notable events documented.   Last Vitals:  Vitals:   10/30/21 1500 10/30/21 1515  BP: 101/70 115/77  Pulse: 73 68  Resp: 16 13  Temp:  36.6 C  SpO2: 99% 100%    Last Pain:  Vitals:   10/30/21 1521  TempSrc:   PainSc: 3                  Windell Norfolk

## 2021-11-14 ENCOUNTER — Ambulatory Visit (INDEPENDENT_AMBULATORY_CARE_PROVIDER_SITE_OTHER): Payer: Medicare HMO | Admitting: Orthopedic Surgery

## 2021-11-14 ENCOUNTER — Encounter: Payer: Self-pay | Admitting: Orthopedic Surgery

## 2021-11-14 DIAGNOSIS — Z8781 Personal history of (healed) traumatic fracture: Secondary | ICD-10-CM

## 2021-11-14 DIAGNOSIS — Z9889 Other specified postprocedural states: Secondary | ICD-10-CM

## 2021-11-14 NOTE — Progress Notes (Signed)
Chief Complaint  Patient presents with   Knee Pain    Postop [facility] 10/30/21- right knee, doing ok.  Has muscle spasms end of day/night, no meds for that.  Still has some oxycodone, takes only as needed.      Encounter Diagnosis  Name Primary?   S/P ORIF (open reduction internal fixation) fracture right patella 05/26/19  Yes    Postop visit #1 status post removal of hardware from prior patella fracture  Patient says he is doing much better  His wound looks good  He will be discharged encouraged to come back if he has any problems with his knee

## 2021-12-10 ENCOUNTER — Ambulatory Visit (INDEPENDENT_AMBULATORY_CARE_PROVIDER_SITE_OTHER): Payer: Medicare HMO | Admitting: Family Medicine

## 2021-12-10 VITALS — BP 120/60 | HR 77 | Temp 98.1°F | Ht 65.0 in | Wt 141.8 lb

## 2021-12-10 DIAGNOSIS — J069 Acute upper respiratory infection, unspecified: Secondary | ICD-10-CM

## 2021-12-10 MED ORDER — AZITHROMYCIN 250 MG PO TABS: ORAL_TABLET | ORAL | 0 refills | Status: DC
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
## 2021-12-17 ENCOUNTER — Telehealth: Payer: Self-pay

## 2021-12-17 NOTE — Telephone Encounter (Signed)
Pt called in stating that since he was recently seen by pcp for some congestion and cold like symptoms he has taken all the meds that pcp prescribed. Pt states now he has been having trouble hearing. Pt  states that everything sounds very muffled. Pt wanted to know if there something that could be called into pharmacy for this. Please advise.  Cb#: [phone]

## 2021-12-18 ENCOUNTER — Other Ambulatory Visit: Payer: Self-pay | Admitting: Family Medicine

## 2021-12-18 ENCOUNTER — Ambulatory Visit (INDEPENDENT_AMBULATORY_CARE_PROVIDER_SITE_OTHER): Payer: Medicare HMO | Admitting: Family Medicine

## 2021-12-18 VITALS — BP 118/70 | HR 67 | Temp 98.5°F | Ht 65.0 in | Wt 143.8 lb

## 2021-12-18 DIAGNOSIS — H9123 Sudden idiopathic hearing loss, bilateral: Secondary | ICD-10-CM

## 2021-12-18 DIAGNOSIS — H6983 Other specified disorders of Eustachian tube, bilateral: Secondary | ICD-10-CM

## 2021-12-18 MED ORDER — AMOXICILLIN-POT CLAVULANATE 875-125 MG PO TABS: 1.0000 | ORAL_TABLET | Freq: Two times a day (BID) | ORAL | 0 refills | Status: DC

## 2021-12-18 MED ORDER — PREDNISONE 20 MG PO TABS: ORAL_TABLET | ORAL | 0 refills | Status: DC
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
## 2021-12-19 ENCOUNTER — Telehealth: Payer: Self-pay | Admitting: Family Medicine

## 2021-12-19 NOTE — Telephone Encounter (Signed)
Patient called to report adverse reaction to predniSONE (DELTASONE) 20 MG tablet [[id number]] . Patient stated last night it made him throw up and gave him bowel issues (two episodes)He also stated he felt like he had knots in his stomach.   Patient stated he has discontinued using the prednisone and is requesting a different medication. Also reports the congestion in his ears is decreasing but he's still having difficulty.   Pharmacy confirmed as  [id number] - [place] [employer], [address] [place], [place] [address]  Phone:  [phone]  Fax:  [phone]  DEA #:  [id number]  Please advise at [phone].

## 2022-02-07 ENCOUNTER — Telehealth: Payer: Self-pay | Admitting: Cardiovascular Disease

## 2022-02-07 NOTE — Telephone Encounter (Signed)
Pt c/o medication issue:  1. Name of Medication: Alirocumab (PRALUENT) 75 MG/ML SOAJ   2. How are you currently taking this medication (dosage and times per day)? Inject every 14 days  3. Are you having a reaction (difficulty breathing--STAT)? no  4. What is your medication issue? Samantha with [facility] calling to follow up on the praluent prior authorization that was sent twice. They would like to know if it was received and whether it is being worked on. Phone: [phone]

## 2022-02-08 NOTE — Telephone Encounter (Signed)
[facility] is returning call. The Rx  # for callback is [phone] and the store # is [phone] to be able to reach [place].

## 2022-02-08 NOTE — Telephone Encounter (Signed)
Approved through 07/10/23. Pharmacy made aware.

## 2022-02-08 NOTE — Telephone Encounter (Signed)
Patient has HTN now. Pharmacy sent over key Key: [id number]  PA submitted.

## 2022-02-08 NOTE — Telephone Encounter (Signed)
Need current insurance information.  Called pharmacy. No answer.

## 2022-04-17 ENCOUNTER — Telehealth: Payer: Self-pay | Admitting: Cardiovascular Disease

## 2022-04-17 NOTE — Telephone Encounter (Signed)
Patient lost insurance, his agent is working on sorting out his disability insurance. Hoping to hear back in 2-3 weeks. Took last dose of Praluent 3 weeks ago. Patient will call back in 2 weeks if the issue persist with his insurance. May change Praluent to Repatha and provide him some samples of Repatha.

## 2022-04-17 NOTE — Telephone Encounter (Signed)
Pt c/o medication issue:  1. Name of Medication: Alirocumab ([place]) 75 MG/ML SOAJ   2. How are you currently taking this medication (dosage and times per day)?   Inject 75 mg into the skin every 14 (fourteen) days    3. Are you having a reaction (difficulty breathing--STAT)? no  4. What is your medication issue? Calling to see if our office have any samples. Please adevise

## 2022-05-06 NOTE — Telephone Encounter (Signed)
Patient called stating that he has been able to resume his cholesterol shots and hopes to continue to take them. He said they were no charge the last time he picked them up. Did go about a month or so without them. Called to Speak with Barnett Applebaum. Advised that I would pass the message along and if she needed to follow up with him, she would call him back.

## 2022-05-30 ENCOUNTER — Encounter: Payer: Self-pay | Admitting: Radiology

## 2022-06-06 ENCOUNTER — Telehealth: Payer: Self-pay | Admitting: Cardiovascular Disease

## 2022-06-06 NOTE — Telephone Encounter (Signed)
Pt c/o medication issue:  1. Name of Medication: Alirocumab ([place]) 75 MG/ML SOAJ   2. How are you currently taking this medication (dosage and times per day)?   3. Are you having a reaction (difficulty breathing--STAT)?   4. What is your medication issue? Pharmacy calling to get prior auth for this medication.

## 2022-06-07 ENCOUNTER — Telehealth: Payer: Self-pay

## 2022-06-07 ENCOUNTER — Other Ambulatory Visit (HOSPITAL_COMMUNITY): Payer: Self-pay

## 2022-06-07 MED ORDER — REPATHA SURECLICK 140 MG/ML ~~LOC~~ SOAJ: 1.0000 | SUBCUTANEOUS | 11 refills | Status: DC

## 2022-06-07 NOTE — Telephone Encounter (Signed)
Caller is follow-up on patient's prior authorization for Alirocumab ([place]) 20 MG/ML SOAJ.  Caller noted reference number is [id number].

## 2022-06-07 NOTE — Telephone Encounter (Signed)
Pharmacy Patient Advocate Encounter   Received notification from [place] that prior authorization for [place] is needed.    Per test claim drug is plan/benefit exclusion. Repatha is preferred with a PA.   [facility] please advise if change is appropriate.   Karie Soda, CPhT Pharmacy Patient Advocate Specialist Direct Number: [phone] Fax: [phone]

## 2022-06-07 NOTE — Telephone Encounter (Signed)
Pharmacy Patient Advocate Encounter  Prior Authorization for REPATHA 140 MG/ML INJ has been approved.    Effective dates: 04/01/22 through 04/01/23   Received notification from [facility] that prior authorization for REPATHA 140 MG/ML INJ is needed.    PA submitted on 06/07/22 Key [id number] Status is pending  Karie Soda, CPhT Pharmacy Patient Advocate Specialist Direct Number: [phone] Fax: [phone]

## 2022-06-07 NOTE — Telephone Encounter (Signed)
Ok to submit prior authorization for Repatha if preferred. No latex allergy, no prior intolerances to Repatha noted.

## 2022-06-07 NOTE — Addendum Note (Signed)
Addended by: Xoie Kreuser E on: 06/07/2022 04:28 PM   Modules accepted: Orders

## 2022-06-07 NOTE — Telephone Encounter (Signed)
Called pt to discuss change from Praluent to [place] due to formulary change. New rx sent to pharmacy, pt aware.

## 2022-06-11 ENCOUNTER — Telehealth: Payer: Self-pay | Admitting: Family Medicine

## 2022-06-11 NOTE — Telephone Encounter (Signed)
Contacted Jeremy Hancock to schedule their annual wellness visit. Appointment made for 06/13/2022.  Thank you,  [name],  [place] [facility] Direct Dial ??[phone]
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
## 2022-07-17 ENCOUNTER — Telehealth: Payer: Self-pay | Admitting: Family Medicine

## 2022-07-17 NOTE — Telephone Encounter (Signed)
Called patient to schedule Medicare Annual Wellness Visit (AWV). Left message for patient to call back and schedule Medicare Annual Wellness Visit (AWV).  Last date of AWV: 05/04/2021   Please schedule an appointment at any time with Toni Amend, [facility] .  If any questions, please contact me at [phone].  Thank you,  Judeth Cornfield,  AMB Clinical Support [facility] AWV Program Direct Dial ??[phone]

## 2022-08-08 ENCOUNTER — Ambulatory Visit (INDEPENDENT_AMBULATORY_CARE_PROVIDER_SITE_OTHER): Payer: Medicare HMO

## 2022-08-08 VITALS — Ht 65.0 in | Wt 143.0 lb

## 2022-08-08 DIAGNOSIS — Z0000 Encounter for general adult medical examination without abnormal findings: Secondary | ICD-10-CM

## 2022-08-08 DIAGNOSIS — Z1211 Encounter for screening for malignant neoplasm of colon: Secondary | ICD-10-CM
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
## 2022-08-08 NOTE — Patient Instructions (Signed)
Jeremy Hancock , Thank you for taking time to come for your Medicare Wellness Visit. I appreciate your ongoing commitment to your health goals. Please review the following plan we discussed and let me know if I can assist you in the future.   These are the goals we discussed:  Goals      Exercise 3x per week (30 min per time)     Eat healthy and exercise.         This is a list of the screening recommended for you and due dates:  Health Maintenance  Topic Date Due   Colon Cancer Screening  Never done   Zoster (Shingles) Vaccine (1 of 2) 11/08/2022*   Screening for Lung Cancer  08/08/2023*   Medicare Annual Wellness Visit  08/08/2023   DTaP/Tdap/Td vaccine (2 - Td or Tdap) 07/30/2025   Hepatitis C Screening: USPSTF Recommendation to screen - Ages 8-79 yo.  Completed   HIV Screening  Completed   HPV Vaccine  Aged Out   Flu Shot  Discontinued   COVID-19 Vaccine  Discontinued  *Topic was postponed. The date shown is not the original due date.    Advanced directives: Information on Advanced Care Planning can be found at [facility] Advance Health Care Directives Advance Health Care Directives ([email or web address]   Conditions/risks identified: Aim for 30 minutes of exercise or brisk walking, 6-8 glasses of water, and 5 servings of fruits and vegetables each day.  Next appointment: Follow up in one year for your annual wellness visit   Preventive Care 40-64 Years, Male Preventive care refers to lifestyle choices and visits with your health care provider that can promote health and wellness. What does preventive care include? A yearly physical exam. This is also called an annual well check. Dental exams once or twice a year. Routine eye exams. Ask your health care provider how often you should have your eyes checked. Personal lifestyle choices, including: Daily care of your teeth and gums. Regular physical activity. Eating a healthy diet. Avoiding tobacco and drug  use. Limiting alcohol use. Practicing safe sex. Taking low-dose aspirin every day starting at age 60. What happens during an annual well check? The services and screenings done by your health care provider during your annual well check will depend on your age, overall health, lifestyle risk factors, and family history of disease. Counseling  Your health care provider may ask you questions about your: Alcohol use. Tobacco use. Drug use. Emotional well-being. Home and relationship well-being. Sexual activity. Eating habits. Work and work Astronomer. Screening  You may have the following tests or measurements: Height, weight, and BMI. Blood pressure. Lipid and cholesterol levels. These may be checked every 5 years, or more frequently if you are over 70 years old. Skin check. Lung cancer screening. You may have this screening every year starting at age 65 if you have a 30-pack-year history of smoking and currently smoke or have quit within the past 15 years. Fecal occult blood test (FOBT) of the stool. You may have this test every year starting at age 62. Flexible sigmoidoscopy or colonoscopy. You may have a sigmoidoscopy every 5 years or a colonoscopy every 10 years starting at age 64. Prostate cancer screening. Recommendations will vary depending on your family history and other risks. Hepatitis C blood test. Hepatitis B blood test. Sexually transmitted disease (STD) testing. Diabetes screening. This is done by checking your blood sugar (glucose) after you have not eaten for a while (fasting). You  may have this done every 1-3 years. Discuss your test results, treatment options, and if necessary, the need for more tests with your health care provider. Vaccines  Your health care provider may recommend certain vaccines, such as: Influenza vaccine. This is recommended every year. Tetanus, diphtheria, and acellular pertussis (Tdap, Td) vaccine. You may need a Td booster every 10  years. Zoster vaccine. You may need this after age 62. Pneumococcal 13-valent conjugate (PCV13) vaccine. You may need this if you have certain conditions and have not been vaccinated. Pneumococcal polysaccharide (PPSV23) vaccine. You may need one or two doses if you smoke cigarettes or if you have certain conditions. Talk to your health care provider about which screenings and vaccines you need and how often you need them. This information is not intended to replace advice given to you by your health care provider. Make sure you discuss any questions you have with your health care provider. Document Released: 04/14/2015 Document Revised: 12/06/2015 Document Reviewed: 01/17/2015 Elsevier Interactive Patient Education  2017 [employer].  Fall Prevention in the Home Falls can cause injuries. They can happen to people of all ages. There are many things you can do to make your home safe and to help prevent falls. What can I do on the outside of my home? Regularly fix the edges of walkways and driveways and fix any cracks. Remove anything that might make you trip as you walk through a door, such as a raised step or threshold. Trim any bushes or trees on the path to your home. Use bright outdoor lighting. Clear any walking paths of anything that might make someone trip, such as rocks or tools. Regularly check to see if handrails are loose or broken. Make sure that both sides of any steps have handrails. Any raised decks and porches should have guardrails on the edges. Have any leaves, snow, or ice cleared regularly. Use sand or salt on walking paths during winter. Clean up any spills in your garage right away. This includes oil or grease spills. What can I do in the bathroom? Use night lights. Install grab bars by the toilet and in the tub and shower. Do not use towel bars as grab bars. Use non-skid mats or decals in the tub or shower. If you need to sit down in the shower, use a plastic,  non-slip stool. Keep the floor dry. Clean up any water that spills on the floor as soon as it happens. Remove soap buildup in the tub or shower regularly. Attach bath mats securely with double-sided non-slip rug tape. Do not have throw rugs and other things on the floor that can make you trip. What can I do in the bedroom? Use night lights. Make sure that you have a light by your bed that is easy to reach. Do not use any sheets or blankets that are too big for your bed. They should not hang down onto the floor. Have a firm chair that has side arms. You can use this for support while you get dressed. Do not have throw rugs and other things on the floor that can make you trip. What can I do in the kitchen? Clean up any spills right away. Avoid walking on wet floors. Keep items that you use a lot in easy-to-reach places. If you need to reach something above you, use a strong step stool that has a grab bar. Keep electrical cords out of the way. Do not use floor polish or wax that makes floors slippery.  If you must use wax, use non-skid floor wax. Do not have throw rugs and other things on the floor that can make you trip. What can I do with my stairs? Do not leave any items on the stairs. Make sure that there are handrails on both sides of the stairs and use them. Fix handrails that are broken or loose. Make sure that handrails are as long as the stairways. Check any carpeting to make sure that it is firmly attached to the stairs. Fix any carpet that is loose or worn. Avoid having throw rugs at the top or bottom of the stairs. If you do have throw rugs, attach them to the floor with carpet tape. Make sure that you have a light switch at the top of the stairs and the bottom of the stairs. If you do not have them, ask someone to add them for you. What else can I do to help prevent falls? Wear shoes that: Do not have high heels. Have rubber bottoms. Are comfortable and fit you well. Are closed  at the toe. Do not wear sandals. If you use a stepladder: Make sure that it is fully opened. Do not climb a closed stepladder. Make sure that both sides of the stepladder are locked into place. Ask someone to hold it for you, if possible. Clearly mark and make sure that you can see: Any grab bars or handrails. First and last steps. Where the edge of each step is. Use tools that help you move around (mobility aids) if they are needed. These include: Canes. Walkers. Scooters. Crutches. Turn on the lights when you go into a dark area. Replace any light bulbs as soon as they burn out. Set up your furniture so you have a clear path. Avoid moving your furniture around. If any of your floors are uneven, fix them. If there are any pets around you, be aware of where they are. Review your medicines with your doctor. Some medicines can make you feel dizzy. This can increase your chance of falling. Ask your doctor what other things that you can do to help prevent falls. This information is not intended to replace advice given to you by your health care provider. Make sure you discuss any questions you have with your health care provider. Document Released: 01/12/2009 Document Revised: 08/24/2015 Document Reviewed: 04/22/2014 Elsevier Interactive Patient Education  2017 [employer].

## 2022-10-04 ENCOUNTER — Encounter: Payer: Medicare HMO | Admitting: Family Medicine

## 2022-11-11 ENCOUNTER — Encounter: Payer: Self-pay | Admitting: Family Medicine

## 2022-11-11 ENCOUNTER — Ambulatory Visit (INDEPENDENT_AMBULATORY_CARE_PROVIDER_SITE_OTHER): Payer: Medicare HMO | Admitting: Family Medicine

## 2022-11-11 VITALS — BP 120/64 | HR 72 | Temp 98.0°F | Ht 65.0 in | Wt 150.2 lb

## 2022-11-11 DIAGNOSIS — Z0001 Encounter for general adult medical examination with abnormal findings: Secondary | ICD-10-CM

## 2022-11-11 DIAGNOSIS — R1033 Periumbilical pain: Secondary | ICD-10-CM | POA: Insufficient documentation

## 2022-11-11 DIAGNOSIS — Z0000 Encounter for general adult medical examination without abnormal findings: Secondary | ICD-10-CM

## 2022-11-11 DIAGNOSIS — I2510 Atherosclerotic heart disease of native coronary artery without angina pectoris: Secondary | ICD-10-CM

## 2022-11-11 DIAGNOSIS — R42 Dizziness and giddiness: Secondary | ICD-10-CM | POA: Insufficient documentation

## 2022-11-11 DIAGNOSIS — Z800 Family history of malignant neoplasm of digestive organs: Secondary | ICD-10-CM | POA: Insufficient documentation

## 2022-11-11 DIAGNOSIS — E291 Testicular hypofunction: Secondary | ICD-10-CM

## 2022-11-11 DIAGNOSIS — A0472 Enterocolitis due to Clostridium difficile, not specified as recurrent: Secondary | ICD-10-CM | POA: Insufficient documentation

## 2022-11-11 DIAGNOSIS — K648 Other hemorrhoids: Secondary | ICD-10-CM | POA: Insufficient documentation

## 2022-11-11 DIAGNOSIS — K625 Hemorrhage of anus and rectum: Secondary | ICD-10-CM | POA: Insufficient documentation

## 2022-11-11 DIAGNOSIS — Z1211 Encounter for screening for malignant neoplasm of colon: Secondary | ICD-10-CM | POA: Diagnosis not present

## 2022-11-11 DIAGNOSIS — R141 Gas pain: Secondary | ICD-10-CM | POA: Insufficient documentation
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
## 2022-11-12 ENCOUNTER — Other Ambulatory Visit: Payer: Self-pay

## 2022-11-12 LAB — CBC WITH DIFFERENTIAL/PLATELET
Absolute Monocytes: 678 cells/uL (ref 200–950)
Basophils Absolute: 97 cells/uL (ref 0–200)
Basophils Relative: 1.1 %
Eosinophils Absolute: 554 cells/uL — ABNORMAL HIGH (ref 15–500)
Eosinophils Relative: 6.3 %
HCT: 48.7 % (ref 38.5–50.0)
Hemoglobin: 16.2 g/dL (ref 13.2–17.1)
Lymphs Abs: 2490 cells/uL (ref 850–3900)
MCH: 29.2 pg (ref 27.0–33.0)
MCHC: 33.3 g/dL (ref 32.0–36.0)
MCV: 87.9 fL (ref 80.0–100.0)
MPV: 9.4 fL (ref 7.5–12.5)
Monocytes Relative: 7.7 %
Neutro Abs: 4981 cells/uL (ref 1500–7800)
Neutrophils Relative %: 56.6 %
Platelets: 295 10*3/uL (ref 140–400)
RBC: 5.54 10*6/uL (ref 4.20–5.80)
RDW: 13.1 % (ref 11.0–15.0)
Total Lymphocyte: 28.3 %
WBC: 8.8 10*3/uL (ref 3.8–10.8)

## 2022-11-12 LAB — LIPID PANEL
Cholesterol: 141 mg/dL (ref <200)
HDL: 36 mg/dL — ABNORMAL LOW (ref >=40)
LDL Cholesterol (Calc): 60 mg/dL (calc)
Non-HDL Cholesterol (Calc): 105 mg/dL (calc) (ref <130)
Total CHOL/HDL Ratio: 3.9 (calc) (ref <5.0)
Triglycerides: 371 mg/dL — ABNORMAL HIGH (ref <150)

## 2022-11-12 LAB — COMPLETE METABOLIC PANEL WITH GFR
AG Ratio: 1.7 (calc) (ref 1.0–2.5)
ALT: 46 U/L (ref 9–46)
AST: 29 U/L (ref 10–35)
Albumin: 4.3 g/dL (ref 3.6–5.1)
Alkaline phosphatase (APISO): 78 U/L (ref 35–144)
BUN: 12 mg/dL (ref 7–25)
CO2: 28 mmol/L (ref 20–32)
Calcium: 9.9 mg/dL (ref 8.6–10.3)
Chloride: 104 mmol/L (ref 98–110)
Creat: 0.94 mg/dL (ref 0.70–1.30)
Globulin: 2.6 g/dL (calc) (ref 1.9–3.7)
Glucose, Bld: 93 mg/dL (ref 65–99)
Potassium: 4.1 mmol/L (ref 3.5–5.3)
Sodium: 142 mmol/L (ref 135–146)
Total Bilirubin: 0.5 mg/dL (ref 0.2–1.2)
Total Protein: 6.9 g/dL (ref 6.1–8.1)
eGFR: 99 mL/min/{1.73_m2} (ref >=60)

## 2022-11-12 LAB — TESTOSTERONE TOTAL,FREE,BIO, MALES: Albumin: 4.3 g/dL (ref 3.6–5.1)

## 2022-11-12 MED ORDER — OMEGA-3-ACID ETHYL ESTERS 1 G PO CAPS: 2.0000 g | ORAL_CAPSULE | Freq: Every day | ORAL | 1 refills | Status: DC

## 2022-11-15 ENCOUNTER — Other Ambulatory Visit (INDEPENDENT_AMBULATORY_CARE_PROVIDER_SITE_OTHER): Payer: Self-pay | Admitting: Family Medicine

## 2022-11-18 ENCOUNTER — Telehealth: Payer: Self-pay | Admitting: Family Medicine

## 2022-11-18 NOTE — Telephone Encounter (Signed)
Patient called to follow up on colonoscopy appointment; stated he hasn't been contacted for scheduling.   Please advise at [phone].

## 2022-11-18 NOTE — Telephone Encounter (Signed)
Patient called to follow up on new script provider sent in to lower triglycerides. Patient stated his insurance doesn't cover it; unsure if PA needed or if provider needs to prescribe a different medication.  Pharmacy confirmed as   [id number] - [place], [place] - [address] SEC [address], [place] [address] Phone: [phone]  Fax: [phone] DEA #: [id number]   Please advise at [phone].

## 2022-11-19 NOTE — Telephone Encounter (Signed)
PA FOR LOVAZA SENT TO INSURANCE:  [place] (Key: [id number]) PA Case ID #: [id number] Need Help? Call us at [phone] Status sent iconSent to Plan today Drug Lovaza 1GM capsules ePA cloud logo Form [employer] Electronic PA Form [phone] NCPDP)

## 2022-11-21 ENCOUNTER — Telehealth: Payer: Self-pay

## 2022-11-21 NOTE — Telephone Encounter (Signed)
Received fax from [facility], stating that pt's Lovaza PA was denied. Thank you.

## 2023-01-03 ENCOUNTER — Other Ambulatory Visit: Payer: Self-pay | Admitting: Family Medicine

## 2023-01-03 DIAGNOSIS — Z1211 Encounter for screening for malignant neoplasm of colon: Secondary | ICD-10-CM

## 2023-01-03 DIAGNOSIS — Z1212 Encounter for screening for malignant neoplasm of rectum: Secondary | ICD-10-CM

## 2023-01-29 DIAGNOSIS — Z1212 Encounter for screening for malignant neoplasm of rectum: Secondary | ICD-10-CM | POA: Diagnosis not present

## 2023-01-29 DIAGNOSIS — Z1211 Encounter for screening for malignant neoplasm of colon: Secondary | ICD-10-CM | POA: Diagnosis not present

## 2023-02-05 LAB — COLOGUARD: COLOGUARD: NEGATIVE

## 2023-04-16 ENCOUNTER — Encounter: Payer: Self-pay | Admitting: Family Medicine

## 2023-04-16 ENCOUNTER — Ambulatory Visit: Payer: Medicare HMO | Admitting: Family Medicine

## 2023-04-16 VITALS — BP 115/80 | HR 68 | Temp 98.3°F | Ht 65.0 in | Wt 150.0 lb

## 2023-04-16 DIAGNOSIS — B9689 Other specified bacterial agents as the cause of diseases classified elsewhere: Secondary | ICD-10-CM | POA: Diagnosis not present

## 2023-04-16 DIAGNOSIS — J019 Acute sinusitis, unspecified: Secondary | ICD-10-CM

## 2023-04-16 MED ORDER — DOXYCYCLINE HYCLATE 100 MG PO TABS: 100.0000 mg | ORAL_TABLET | Freq: Two times a day (BID) | ORAL | 0 refills | Status: AC
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
## 2023-04-16 NOTE — Assessment & Plan Note (Signed)
 Symptoms consistent with acute onset bacterial sinusitis with malaise, unilateral sinus pain, mucopurulent rhinorrhea, and loss of taste and smell. Will treat with Doxy 100mg  BID x7d.  Continue supportive care. Increase fluid intake with water or electrolyte solution like pedialyte. Encouraged acetaminophen  as needed for fever/pain. Encouraged salt water gargling, chloraseptic spray and throat lozenges. Encouraged OTC guaifenesin . Encouraged saline sinus flushes and/or neti with humidified air.

## 2023-04-16 NOTE — Patient Instructions (Signed)
-   Increased rest - Increasing fluid with water/sugar free electrolytes - Acetaminophen  as needed for fever/pain.  - Salt water gargling, chloraseptic spray and throat lozenges - OTC guaifenesin  (Mucinex ).  - Saline sinus flushes or a neti pot.  - Humidifying the air.

## 2023-05-04 ENCOUNTER — Other Ambulatory Visit: Payer: Self-pay | Admitting: Cardiovascular Disease

## 2023-05-05 ENCOUNTER — Encounter: Payer: Self-pay | Admitting: Pharmacist

## 2023-08-24 ENCOUNTER — Other Ambulatory Visit: Payer: Self-pay | Admitting: Cardiovascular Disease

## 2023-08-24 DIAGNOSIS — Z8249 Family history of ischemic heart disease and other diseases of the circulatory system: Secondary | ICD-10-CM

## 2023-08-24 DIAGNOSIS — R931 Abnormal findings on diagnostic imaging of heart and coronary circulation: Secondary | ICD-10-CM

## 2023-08-24 DIAGNOSIS — E782 Mixed hyperlipidemia: Secondary | ICD-10-CM

## 2023-09-11 ENCOUNTER — Ambulatory Visit: Payer: Medicare HMO | Admitting: *Deleted

## 2023-09-11 DIAGNOSIS — Z0000 Encounter for general adult medical examination without abnormal findings: Secondary | ICD-10-CM | POA: Diagnosis not present
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
## 2023-09-11 NOTE — Patient Instructions (Signed)
 Jeremy Hancock , Thank you for taking time to come for your Medicare Wellness Visit. I appreciate your ongoing commitment to your health goals. Please review the following plan we discussed and let me know if I can assist you in the future.   Screening recommendations/referrals: Colonoscopy: up to date Recommended yearly ophthalmology/optometry visit for glaucoma screening and checkup Recommended yearly dental visit for hygiene and checkup  Vaccinations: Influenza vaccine: up to date Pneumococcal vaccine: Education provided Tdap vaccine: up to date Shingles vaccine: Education provided      Preventive Care 40-64 Years, Male Preventive care refers to lifestyle choices and visits with your health care provider that can promote health and wellness. What does preventive care include? A yearly physical exam. This is also called an annual well check. Dental exams once or twice a year. Routine eye exams. Ask your health care provider how often you should have your eyes checked. Personal lifestyle choices, including: Daily care of your teeth and gums. Regular physical activity. Eating a healthy diet. Avoiding tobacco and drug use. Limiting alcohol use. Practicing safe sex. Taking low-dose aspirin  every day starting at age 7. What happens during an annual well check? The services and screenings done by your health care provider during your annual well check will depend on your age, overall health, lifestyle risk factors, and family history of disease. Counseling  Your health care provider may ask you questions about your: Alcohol use. Tobacco use. Drug use. Emotional well-being. Home and relationship well-being. Sexual activity. Eating habits. Work and work Astronomer. Screening  You may have the following tests or measurements: Height, weight, and BMI. Blood pressure. Lipid and cholesterol levels. These may be checked every 5 years, or more frequently if you are over 62 years  old. Skin check. Lung cancer screening. You may have this screening every year starting at age 91 if you have a 30-pack-year history of smoking and currently smoke or have quit within the past 15 years. Fecal occult blood test (FOBT) of the stool. You may have this test every year starting at age 74. Flexible sigmoidoscopy or colonoscopy. You may have a sigmoidoscopy every 5 years or a colonoscopy every 10 years starting at age 28. Prostate cancer screening. Recommendations will vary depending on your family history and other risks. Hepatitis C blood test. Hepatitis B blood test. Sexually transmitted disease (STD) testing. Diabetes screening. This is done by checking your blood sugar (glucose) after you have not eaten for a while (fasting). You may have this done every 1-3 years. Discuss your test results, treatment options, and if necessary, the need for more tests with your health care provider. Vaccines  Your health care provider may recommend certain vaccines, such as: Influenza vaccine. This is recommended every year. Tetanus, diphtheria, and acellular pertussis (Tdap, Td) vaccine. You may need a Td booster every 10 years. Zoster vaccine. You may need this after age 54. Pneumococcal 13-valent conjugate (PCV13) vaccine. You may need this if you have certain conditions and have not been vaccinated. Pneumococcal polysaccharide (PPSV23) vaccine. You may need one or two doses if you smoke cigarettes or if you have certain conditions. Talk to your health care provider about which screenings and vaccines you need and how often you need them. This information is not intended to replace advice given to you by your health care provider. Make sure you discuss any questions you have with your health care provider. Document Released: 04/14/2015 Document Revised: 12/06/2015 Document Reviewed: 01/17/2015 Elsevier Interactive Patient Education  2017  [employer].  Fall Prevention in the Home Falls can  cause injuries. They can happen to people of all ages. There are many things you can do to make your home safe and to help prevent falls. What can I do on the outside of my home? Regularly fix the edges of walkways and driveways and fix any cracks. Remove anything that might make you trip as you walk through a door, such as a raised step or threshold. Trim any bushes or trees on the path to your home. Use bright outdoor lighting. Clear any walking paths of anything that might make someone trip, such as rocks or tools. Regularly check to see if handrails are loose or broken. Make sure that both sides of any steps have handrails. Any raised decks and porches should have guardrails on the edges. Have any leaves, snow, or ice cleared regularly. Use sand or salt on walking paths during winter. Clean up any spills in your garage right away. This includes oil or grease spills. What can I do in the bathroom? Use night lights. Install grab bars by the toilet and in the tub and shower. Do not use towel bars as grab bars. Use non-skid mats or decals in the tub or shower. If you need to sit down in the shower, use a plastic, non-slip stool. Keep the floor dry. Clean up any water that spills on the floor as soon as it happens. Remove soap buildup in the tub or shower regularly. Attach bath mats securely with double-sided non-slip rug tape. Do not have throw rugs and other things on the floor that can make you trip. What can I do in the bedroom? Use night lights. Make sure that you have a light by your bed that is easy to reach. Do not use any sheets or blankets that are too big for your bed. They should not hang down onto the floor. Have a firm chair that has side arms. You can use this for support while you get dressed. Do not have throw rugs and other things on the floor that can make you trip. What can I do in the kitchen? Clean up any spills right away. Avoid walking on wet floors. Keep items  that you use a lot in easy-to-reach places. If you need to reach something above you, use a strong step stool that has a grab bar. Keep electrical cords out of the way. Do not use floor polish or wax that makes floors slippery. If you must use wax, use non-skid floor wax. Do not have throw rugs and other things on the floor that can make you trip. What can I do with my stairs? Do not leave any items on the stairs. Make sure that there are handrails on both sides of the stairs and use them. Fix handrails that are broken or loose. Make sure that handrails are as long as the stairways. Check any carpeting to make sure that it is firmly attached to the stairs. Fix any carpet that is loose or worn. Avoid having throw rugs at the top or bottom of the stairs. If you do have throw rugs, attach them to the floor with carpet tape. Make sure that you have a light switch at the top of the stairs and the bottom of the stairs. If you do not have them, ask someone to add them for you. What else can I do to help prevent falls? Wear shoes that: Do not have high heels. Have rubber bottoms. Are  comfortable and fit you well. Are closed at the toe. Do not wear sandals. If you use a stepladder: Make sure that it is fully opened. Do not climb a closed stepladder. Make sure that both sides of the stepladder are locked into place. Ask someone to hold it for you, if possible. Clearly mark and make sure that you can see: Any grab bars or handrails. First and last steps. Where the edge of each step is. Use tools that help you move around (mobility aids) if they are needed. These include: Canes. Walkers. Scooters. Crutches. Turn on the lights when you go into a dark area. Replace any light bulbs as soon as they burn out. Set up your furniture so you have a clear path. Avoid moving your furniture around. If any of your floors are uneven, fix them. If there are any pets around you, be aware of where they  are. Review your medicines with your doctor. Some medicines can make you feel dizzy. This can increase your chance of falling. Ask your doctor what other things that you can do to help prevent falls. This information is not intended to replace advice given to you by your health care provider. Make sure you discuss any questions you have with your health care provider. Document Released: 01/12/2009 Document Revised: 08/24/2015 Document Reviewed: 04/22/2014 Elsevier Interactive Patient Education  2017 [employer].

## 2023-12-17 ENCOUNTER — Other Ambulatory Visit: Payer: Self-pay | Admitting: Cardiovascular Disease

## 2023-12-19 ENCOUNTER — Encounter: Payer: Self-pay | Admitting: Pharmacist

## 2023-12-19 ENCOUNTER — Telehealth: Payer: Self-pay | Admitting: Cardiovascular Disease

## 2023-12-19 DIAGNOSIS — Z8249 Family history of ischemic heart disease and other diseases of the circulatory system: Secondary | ICD-10-CM

## 2023-12-19 DIAGNOSIS — R931 Abnormal findings on diagnostic imaging of heart and coronary circulation: Secondary | ICD-10-CM

## 2023-12-19 DIAGNOSIS — E782 Mixed hyperlipidemia: Secondary | ICD-10-CM

## 2023-12-19 NOTE — Telephone Encounter (Signed)
*  STAT* If patient is at the pharmacy, call can be transferred to refill team.   1. Which medications need to be refilled? (please list name of each medication and dose if known)   REPATHA  SURECLICK 140 MG/ML SOAJ     2. Would you like to learn more about the convenience, safety, & potential cost savings by using the [facility]? No   3. Are you open to using the [facility] (Type [facility]. No   4. Which pharmacy/location (including street and city if local pharmacy) is medication to be sent to?[id number] - [place], [place] - [address]    5. Do they need a 30 day or 90 day supply? 30 day    Pt is out of medication Pt hs appt. 12/31/23

## 2023-12-22 MED ORDER — REPATHA SURECLICK 140 MG/ML ~~LOC~~ SOAJ: 140.0000 mg | SUBCUTANEOUS | 0 refills | Status: DC

## 2023-12-22 NOTE — Telephone Encounter (Signed)
 Pt called in and is checking on his repatha  Shot.  He said he needs to take it and needs to know what is going on and why have he has not rec'd it.    Best number [phone]

## 2023-12-23 NOTE — Telephone Encounter (Signed)
 Called and LVM that rx was sent yesterday

## 2023-12-31 ENCOUNTER — Ambulatory Visit: Attending: Internal Medicine | Admitting: Cardiovascular Disease

## 2023-12-31 ENCOUNTER — Other Ambulatory Visit (HOSPITAL_COMMUNITY): Payer: Self-pay

## 2023-12-31 ENCOUNTER — Encounter: Payer: Self-pay | Admitting: Cardiovascular Disease

## 2023-12-31 VITALS — BP 110/70 | HR 62 | Resp 16 | Ht 65.0 in | Wt 147.1 lb

## 2023-12-31 DIAGNOSIS — I259 Chronic ischemic heart disease, unspecified: Secondary | ICD-10-CM

## 2023-12-31 DIAGNOSIS — R931 Abnormal findings on diagnostic imaging of heart and coronary circulation: Secondary | ICD-10-CM | POA: Diagnosis not present

## 2023-12-31 DIAGNOSIS — E782 Mixed hyperlipidemia: Secondary | ICD-10-CM

## 2023-12-31 DIAGNOSIS — Z8249 Family history of ischemic heart disease and other diseases of the circulatory system: Secondary | ICD-10-CM | POA: Diagnosis not present

## 2023-12-31 DIAGNOSIS — F17200 Nicotine dependence, unspecified, uncomplicated: Secondary | ICD-10-CM

## 2023-12-31 MED ORDER — REPATHA SURECLICK 140 MG/ML ~~LOC~~ SOAJ
140.0000 mg | SUBCUTANEOUS | 4 refills | Status: DC
  Filled 2023-12-31 – 2024-01-13 (×2): qty 6, 84d supply, fill #0

## 2023-12-31 NOTE — Assessment & Plan Note (Signed)
 History of atypical chest pain with essentially normal cardiac cath which I performed 05/20/2016.

## 2023-12-31 NOTE — Assessment & Plan Note (Signed)
 Ongoing tobacco use of three-quarter pack per day recalcitrant to risk factor modification.

## 2023-12-31 NOTE — Assessment & Plan Note (Signed)
 History of hyperlipidemia on Repatha  which unfortunately he has not taken in a week and a half.  His most recent lipid profile performed 11/11/2022 revealed total cholesterol 141, LDL 60 and HDL 36.  I am going to recheck a fasting lipid profile.
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
## 2023-12-31 NOTE — Patient Instructions (Signed)
 Medication Instructions:  Your physician recommends that you continue on your current medications as directed. Please refer to the Current Medication list given to you today.  *If you need a refill on your cardiac medications before your next appointment, please call your pharmacy*  Lab Work: Your physician recommends that you return for lab work in: then next week or 2 for FASTING Lipid/liver panel  If you have labs (blood work) drawn today and your tests are completely normal, you will receive your results only by: MyChart Message (if you have MyChart) OR A paper copy in the mail If you have any lab test that is abnormal or we need to change your treatment, we will call you to review the results.   Follow-Up: At [facility], you and your health needs are our priority.  As part of our continuing mission to provide you with exceptional heart care, our providers are all part of one team.  This team includes your primary Cardiologist (physician) and Advanced Practice Providers or APPs (Physician Assistants and Nurse Practitioners) who all work together to provide you with the care you need, when you need it.  Your next appointment:   6 month(s)  Provider:   Orren Fabry, PA-C, Jon Hails, PA-C, Dayna Dunn, PA-C, Callie Goodrich, PA-C, Kathleen Johnson, PA-C, or Damien Braver, NP        Then, Dorn Lesches, MD  will plan to see you again in 12 month(s).     We recommend signing up for the patient portal called MyChart.  Sign up information is provided on this After Visit Summary.  MyChart is used to connect with patients for Virtual Visits (Telemedicine).  Patients are able to view lab/test results, encounter notes, upcoming appointments, etc.  Non-urgent messages can be sent to your provider as well.   To learn more about what you can do with MyChart, go to [email or web address].

## 2024-01-07 DIAGNOSIS — E782 Mixed hyperlipidemia: Secondary | ICD-10-CM | POA: Diagnosis not present

## 2024-01-07 DIAGNOSIS — R931 Abnormal findings on diagnostic imaging of heart and coronary circulation: Secondary | ICD-10-CM | POA: Diagnosis not present

## 2024-01-08 ENCOUNTER — Ambulatory Visit: Payer: Self-pay | Admitting: Cardiovascular Disease

## 2024-01-08 LAB — HEPATIC FUNCTION PANEL
ALT: 26 IU/L (ref 0–44)
AST: 20 IU/L (ref 0–40)
Albumin: 4.3 g/dL (ref 3.8–4.9)
Alkaline Phosphatase: 131 IU/L — ABNORMAL HIGH (ref 47–123)
Bilirubin Total: 0.4 mg/dL (ref 0.0–1.2)
Bilirubin, Direct: 0.15 mg/dL (ref 0.00–0.40)
Total Protein: 6.9 g/dL (ref 6.0–8.5)

## 2024-01-08 LAB — LIPID PANEL
Chol/HDL Ratio: 3.2 ratio (ref 0.0–5.0)
Cholesterol, Total: 113 mg/dL (ref 100–199)
HDL: 35 mg/dL — ABNORMAL LOW (ref >39)
LDL Chol Calc (NIH): 54 mg/dL (ref 0–99)
Triglycerides: 136 mg/dL (ref 0–149)
VLDL Cholesterol Cal: 24 mg/dL (ref 5–40)

## 2024-01-12 NOTE — Telephone Encounter (Signed)
Patient called to follow-up on his lab test results.

## 2024-01-13 ENCOUNTER — Other Ambulatory Visit (HOSPITAL_COMMUNITY): Payer: Self-pay

## 2024-01-14 NOTE — Telephone Encounter (Signed)
 Patient is calling to follow up. He said he still waiting for someone to call him to explain the result and also he would like to discuss medication

## 2024-01-17 ENCOUNTER — Other Ambulatory Visit: Payer: Self-pay | Admitting: Cardiovascular Disease

## 2024-01-17 DIAGNOSIS — Z8249 Family history of ischemic heart disease and other diseases of the circulatory system: Secondary | ICD-10-CM

## 2024-01-17 DIAGNOSIS — R931 Abnormal findings on diagnostic imaging of heart and coronary circulation: Secondary | ICD-10-CM

## 2024-01-17 DIAGNOSIS — E782 Mixed hyperlipidemia: Secondary | ICD-10-CM

## 2024-02-03 ENCOUNTER — Ambulatory Visit (INDEPENDENT_AMBULATORY_CARE_PROVIDER_SITE_OTHER): Admitting: Family Medicine

## 2024-02-03 ENCOUNTER — Encounter: Payer: Self-pay | Admitting: Family Medicine

## 2024-02-03 VITALS — BP 114/74 | HR 65 | Temp 98.2°F | Ht 65.0 in | Wt 142.2 lb

## 2024-02-03 DIAGNOSIS — I2510 Atherosclerotic heart disease of native coronary artery without angina pectoris: Secondary | ICD-10-CM | POA: Diagnosis not present

## 2024-02-03 DIAGNOSIS — Z125 Encounter for screening for malignant neoplasm of prostate: Secondary | ICD-10-CM | POA: Diagnosis not present

## 2024-02-03 DIAGNOSIS — F17200 Nicotine dependence, unspecified, uncomplicated: Secondary | ICD-10-CM

## 2024-02-03 DIAGNOSIS — M25552 Pain in left hip: Secondary | ICD-10-CM | POA: Diagnosis not present

## 2024-02-03 DIAGNOSIS — M25512 Pain in left shoulder: Secondary | ICD-10-CM

## 2024-02-03 MED ORDER — MELOXICAM 15 MG PO TABS: 15.0000 mg | ORAL_TABLET | Freq: Every day | ORAL | 2 refills | Status: AC
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
## 2024-02-04 LAB — CBC WITH DIFFERENTIAL/PLATELET
Absolute Lymphocytes: 1921 {cells}/uL (ref 850–3900)
Absolute Monocytes: 689 {cells}/uL (ref 200–950)
Basophils Absolute: 68 {cells}/uL (ref 0–200)
Basophils Relative: 0.8 %
Eosinophils Absolute: 323 {cells}/uL (ref 15–500)
Eosinophils Relative: 3.8 %
HCT: 47.6 % (ref 38.5–50.0)
Hemoglobin: 15.8 g/dL (ref 13.2–17.1)
MCH: 29.3 pg (ref 27.0–33.0)
MCHC: 33.2 g/dL (ref 32.0–36.0)
MCV: 88.1 fL (ref 80.0–100.0)
MPV: 8.7 fL (ref 7.5–12.5)
Monocytes Relative: 8.1 %
Neutro Abs: 5500 {cells}/uL (ref 1500–7800)
Neutrophils Relative %: 64.7 %
Platelets: 394 Thousand/uL (ref 140–400)
RBC: 5.4 Million/uL (ref 4.20–5.80)
RDW: 13 % (ref 11.0–15.0)
Total Lymphocyte: 22.6 %
WBC: 8.5 Thousand/uL (ref 3.8–10.8)

## 2024-02-04 LAB — COMPREHENSIVE METABOLIC PANEL WITH GFR
AG Ratio: 1.4 (calc) (ref 1.0–2.5)
ALT: 16 U/L (ref 9–46)
AST: 15 U/L (ref 10–35)
Albumin: 3.9 g/dL (ref 3.6–5.1)
Alkaline phosphatase (APISO): 106 U/L (ref 35–144)
BUN/Creatinine Ratio: 5 (calc) — ABNORMAL LOW (ref 6–22)
BUN: 4 mg/dL — ABNORMAL LOW (ref 7–25)
CO2: 30 mmol/L (ref 20–32)
Calcium: 9.5 mg/dL (ref 8.6–10.3)
Chloride: 104 mmol/L (ref 98–110)
Creat: 0.87 mg/dL (ref 0.70–1.30)
Globulin: 2.8 g/dL (ref 1.9–3.7)
Glucose, Bld: 79 mg/dL (ref 65–99)
Potassium: 4.1 mmol/L (ref 3.5–5.3)
Sodium: 143 mmol/L (ref 135–146)
Total Bilirubin: 0.4 mg/dL (ref 0.2–1.2)
Total Protein: 6.7 g/dL (ref 6.1–8.1)
eGFR: 104 mL/min/1.73m2 (ref >=60)

## 2024-02-04 LAB — PSA: PSA: 1.39 ng/mL (ref <=4.00)

## 2024-02-05 ENCOUNTER — Ambulatory Visit (HOSPITAL_COMMUNITY)
Admission: RE | Admit: 2024-02-05 | Discharge: 2024-02-05 | Disposition: A | Discharge status: Home / Self Care | Source: Ambulatory Visit | Attending: Family Medicine | Admitting: Family Medicine

## 2024-02-05 ENCOUNTER — Ambulatory Visit: Payer: Self-pay | Admitting: Family Medicine

## 2024-02-05 DIAGNOSIS — M799 Soft tissue disorder, unspecified: Secondary | ICD-10-CM | POA: Diagnosis not present

## 2024-02-05 DIAGNOSIS — M25512 Pain in left shoulder: Secondary | ICD-10-CM | POA: Insufficient documentation

## 2024-02-05 DIAGNOSIS — M25552 Pain in left hip: Secondary | ICD-10-CM | POA: Insufficient documentation

## 2024-02-05 DIAGNOSIS — Z87891 Personal history of nicotine dependence: Secondary | ICD-10-CM

## 2024-02-05 DIAGNOSIS — Z122 Encounter for screening for malignant neoplasm of respiratory organs: Secondary | ICD-10-CM

## 2024-02-09 ENCOUNTER — Telehealth: Payer: Self-pay | Admitting: Family Medicine

## 2024-02-09 NOTE — Telephone Encounter (Signed)
 Copied from CRM #[id number]. Topic: Clinical - Lab/Test Results >> Feb 09, 2024  9:47 AM Delon DASEN wrote: Reason for CRM: calling for results of xrays- [phone]

## 2024-02-11 ENCOUNTER — Telehealth: Payer: Self-pay

## 2024-02-11 NOTE — Telephone Encounter (Signed)
 Copied from CRM [id number]. Topic: Clinical - Lab/Test Results >> Feb 10, 2024  8:30 AM Tobias CROME wrote: Reason for CRM: Patient returning call to MaryJane for xray results. Transferred to [facility] >> Feb 11, 2024  1:32 PM Pinkey ORN wrote: Patient is calling, states he was supposed to receive a call back from May Jane to determine the next steps with his hip and she never called back. Patient states he's still experiencing great bit of pain and wants to know what's next. Please follow up with patient.

## 2024-02-12 ENCOUNTER — Other Ambulatory Visit: Payer: Self-pay | Admitting: Family Medicine

## 2024-02-12 MED ORDER — TRAMADOL HCL 50 MG PO TABS: 50.0000 mg | ORAL_TABLET | Freq: Four times a day (QID) | ORAL | 0 refills | Status: AC | PRN

## 2024-02-14 ENCOUNTER — Emergency Department (HOSPITAL_COMMUNITY)
Admission: EM | Admit: 2024-02-14 | Discharge: 2024-02-15 | Disposition: A | Discharge status: Home / Self Care | Attending: Emergency Medicine | Admitting: Emergency Medicine

## 2024-02-14 ENCOUNTER — Emergency Department (HOSPITAL_COMMUNITY)

## 2024-02-14 DIAGNOSIS — R0602 Shortness of breath: Secondary | ICD-10-CM | POA: Diagnosis not present

## 2024-02-14 DIAGNOSIS — M545 Low back pain, unspecified: Secondary | ICD-10-CM | POA: Diagnosis not present

## 2024-02-14 DIAGNOSIS — M25512 Pain in left shoulder: Secondary | ICD-10-CM | POA: Diagnosis not present

## 2024-02-14 DIAGNOSIS — C3490 Malignant neoplasm of unspecified part of unspecified bronchus or lung: Secondary | ICD-10-CM | POA: Diagnosis not present

## 2024-02-14 DIAGNOSIS — Z720 Tobacco use: Secondary | ICD-10-CM | POA: Diagnosis not present

## 2024-02-14 DIAGNOSIS — C20 Malignant neoplasm of rectum: Secondary | ICD-10-CM | POA: Diagnosis not present

## 2024-02-14 DIAGNOSIS — C7951 Secondary malignant neoplasm of bone: Secondary | ICD-10-CM | POA: Diagnosis not present

## 2024-02-14 DIAGNOSIS — M25552 Pain in left hip: Secondary | ICD-10-CM | POA: Diagnosis not present

## 2024-02-14 DIAGNOSIS — R519 Headache, unspecified: Secondary | ICD-10-CM | POA: Insufficient documentation

## 2024-02-14 DIAGNOSIS — R591 Generalized enlarged lymph nodes: Secondary | ICD-10-CM | POA: Diagnosis not present

## 2024-02-14 HISTORY — DX: Malignant neoplasm of unspecified part of unspecified bronchus or lung: C34.90

## 2024-02-14 LAB — COMPREHENSIVE METABOLIC PANEL WITH GFR
ALT: 27 U/L (ref 0–44)
AST: 25 U/L (ref 15–41)
Albumin: 3.9 g/dL (ref 3.5–5.0)
Alkaline Phosphatase: 118 U/L (ref 38–126)
Anion gap: 13 (ref 5–15)
BUN: 6 mg/dL (ref 6–20)
CO2: 24 mmol/L (ref 22–32)
Calcium: 9.1 mg/dL (ref 8.9–10.3)
Chloride: 102 mmol/L (ref 98–111)
Creatinine, Ser: 0.79 mg/dL (ref 0.61–1.24)
GFR, Estimated: >60 mL/min (ref >60)
Glucose, Bld: 161 mg/dL — ABNORMAL HIGH (ref 70–99)
Potassium: 3 mmol/L — ABNORMAL LOW (ref 3.5–5.1)
Sodium: 140 mmol/L (ref 135–145)
Total Bilirubin: <0.2 mg/dL (ref 0.0–1.2)
Total Protein: 6.7 g/dL (ref 6.5–8.1)

## 2024-02-14 LAB — CBC WITH DIFFERENTIAL/PLATELET
Abs Immature Granulocytes: 0.04 K/uL (ref 0.00–0.07)
Basophils Absolute: 0.1 K/uL (ref 0.0–0.1)
Basophils Relative: 1 %
Eosinophils Absolute: 0.4 K/uL (ref 0.0–0.5)
Eosinophils Relative: 5 %
HCT: 42.6 % (ref 39.0–52.0)
Hemoglobin: 13.9 g/dL (ref 13.0–17.0)
Immature Granulocytes: 0 %
Lymphocytes Relative: 28 %
Lymphs Abs: 2.7 K/uL (ref 0.7–4.0)
MCH: 28.9 pg (ref 26.0–34.0)
MCHC: 32.6 g/dL (ref 30.0–36.0)
MCV: 88.6 fL (ref 80.0–100.0)
Monocytes Absolute: 0.7 K/uL (ref 0.1–1.0)
Monocytes Relative: 8 %
Neutro Abs: 5.7 K/uL (ref 1.7–7.7)
Neutrophils Relative %: 58 %
Platelets: 364 K/uL (ref 150–400)
RBC: 4.81 MIL/uL (ref 4.22–5.81)
RDW: 13.4 % (ref 11.5–15.5)
WBC: 9.7 K/uL (ref 4.0–10.5)
nRBC: 0 % (ref 0.0–0.2)

## 2024-02-14 LAB — D-DIMER, QUANTITATIVE: D-Dimer, Quant: 0.93 ug{FEU}/mL — ABNORMAL HIGH (ref 0.00–0.50)

## 2024-02-14 LAB — TROPONIN T, HIGH SENSITIVITY: Troponin T High Sensitivity: <15 ng/L (ref 0–19)

## 2024-02-14 LAB — PRO BRAIN NATRIURETIC PEPTIDE: Pro Brain Natriuretic Peptide: <50 pg/mL (ref <300.0)

## 2024-02-14 MED ORDER — IOHEXOL 350 MG/ML SOLN
100.0000 mL | Freq: Once | INTRAVENOUS | Status: AC | PRN
  Administered 2024-02-14: 100 mL via INTRAVENOUS

## 2024-02-14 NOTE — ED Provider Notes (Signed)
  Provider Note MRN:  [id number]  Arrival date & time: 02/15/24    ED Course and Medical Decision Making  Assumed care of patient at sign-out or upon transfer.  Patient to receive CT imaging to evaluate for aortic pathology, also some consideration for underlying metastatic process.  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  Patient also has a lump or nodule in his left upper gluteal region that is an area of likely metastatic spread.  I had a long conversation with the patient, breaking the news and discussing the concerns and likely next steps.  He is appropriately discouraged, expresses understanding.  I discussed disposition options, offered admission to the hospital to expedite workup.  However with it being Sunday, likely would not undergo biopsy today.  Mild hyperkalemia but otherwise no obvious need for admission.  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  He is encouraged to return at any time for support or further care, will refer him to oncology office and message the oncologist to hopefully expedite follow-up.  Procedures  Final Clinical Impressions(s) / ED Diagnoses     ICD-10-CM   1. Primary malignant neoplasm of lung metastatic to other site, unspecified laterality [facility])  C34.90       ED Discharge Orders     None         Discharge Instructions      You were evaluated in the Emergency Department and after careful evaluation, we did not find any emergent condition requiring admission or further testing in the hospital.  Your testing has shown concern for lung cancer that has spread to other areas.  You will need further testing and likely a biopsy by an oncologist.  Please call Dr. Armanda office first thing Monday  morning.  We will message Dr. Davonna so that she is aware of your case.  Can continue your tramadol  at home as needed for pain.  Please return to the Emergency Department if you experience any worsening of your condition.   Thank you for allowing us  to be a part of your care.      Ozell HERO. Theadore, MD [facility] Emergency Medicine [facility] [email or web address]    Theadore Ozell HERO, MD 02/15/24 [phone]

## 2024-02-14 NOTE — ED Triage Notes (Signed)
 Pt comes in for left hip and arm pain. Pain has been going on for over a month. Pt as multiple complaints and feels like his PCP is not doing anything. Overall, the pt does not feel right and does not feel. A&Ox4.

## 2024-02-14 NOTE — ED Notes (Signed)
 Patient transported to CT

## 2024-02-14 NOTE — ED Provider Notes (Signed)
 [place] EMERGENCY DEPARTMENT AT [facility] Provider Note   CSN: [id number] Arrival date & time: 02/14/24  2129     Patient presents with: Leg Pain (left) and Arm Pain (left)   Jeremy Hancock is a 53 y.o. male.  He is here with multiple complaints.  He said his left shoulder and his left hip have been hurting for over a month.  Keep him up at night.  He has also had a painful lump in his right buttock sacral area.  He has been short of breath and has dyspnea on exertion for even longer.  He has poor appetite.  He said he seen his doctor for this and had some x-rays.  They saw mass and he needs a CAT scan of his chest but they cannot set that up until December.  He does not feel like he can wait that long.  {Add pertinent medical, surgical, social history, OB history to YEP:67052} The history is provided by the patient.  Leg Pain Location:  Hip and buttock Time since incident:  1 month Hip location:  L hip Buttock location:  R buttock Pain details:    Quality:  Aching   Severity:  Severe   Onset quality:  Gradual   Timing:  Constant   Progression:  Worsening Chronicity:  New Exacerbated by: movement. Associated symptoms: fatigue   Arm Pain Associated symptoms include shortness of breath. Pertinent negatives include no chest pain and no abdominal pain.       Prior to Admission medications   Medication Sig Start Date End Date Taking? Authorizing Provider  Evolocumab  (REPATHA  SURECLICK) 140 MG/ML SOAJ ADMINISTER 1 ML UNDER THE SKIN EVERY 14 DAYS 01/20/24   Court Dorn PARAS, MD  ibuprofen  (ADVIL ) 800 MG tablet Take 800 mg by mouth every 8 (eight) hours as needed for moderate pain.    [provider]  meloxicam (MOBIC) 15 MG tablet Take 1 tablet (15 mg total) by mouth daily. 02/03/24   Duanne Emmie Frakes DASEN, MD  Multiple Vitamin (MULTIVITAMIN) tablet Take 1 tablet by mouth daily.    [provider]  traMADol  (ULTRAM ) 50 MG tablet Take 1 tablet (50 mg  total) by mouth every 6 (six) hours as needed. 02/12/24 03/13/24  Duanne Annakate Soulier DASEN, MD    Allergies: Amoxicillin , Codeine, Crestor [rosuvastatin], Hydrocodone -acetaminophen , Lipitor [atorvastatin], Morphine and codeine, Statins, Zetia  [ezetimibe ], and Zyrtec  [cetirizine ]    Review of Systems  Constitutional:  Positive for fatigue.  Eyes:  Negative for visual disturbance.  Respiratory:  Positive for shortness of breath.   Cardiovascular:  Negative for chest pain.  Gastrointestinal:  Positive for constipation. Negative for abdominal pain.  Genitourinary:  Negative for dysuria.    Updated Vital Signs BP (!) 141/101   Pulse 70   Temp 98.2 F (36.8 C) (Oral)   Resp 18   Ht 5' 5 (1.651 m)   Wt 64.4 kg   SpO2 98%   BMI 23.63 kg/m   Physical Exam Vitals and nursing note reviewed.  Constitutional:      General: He is not in acute distress.    Appearance: Normal appearance. He is well-developed.  HENT:     Head: Normocephalic and atraumatic.  Eyes:     Conjunctiva/sclera: Conjunctivae normal.  Cardiovascular:     Rate and Rhythm: Normal rate and regular rhythm.     Heart sounds: No murmur heard. Pulmonary:     Effort: Pulmonary effort is normal. No respiratory distress.     Breath  sounds: Normal breath sounds.  Abdominal:     Palpations: Abdomen is soft.     Tenderness: There is no abdominal tenderness. There is no guarding or rebound.  Musculoskeletal:        General: Tenderness present. No deformity.     Cervical back: Neck supple.     Right lower leg: No edema.     Left lower leg: No edema.     Comments: He has a tender mass in his right buttock sacral area.  He has diffuse left hip tenderness and left shoulder tenderness with range of motion.  Skin:    General: Skin is warm and dry.     Capillary Refill: Capillary refill takes less than 2 seconds.  Neurological:     General: No focal deficit present.     Mental Status: He is alert and oriented to person, place, and  time.     Cranial Nerves: No cranial nerve deficit.     Sensory: No sensory deficit.     Motor: No weakness.     (all labs ordered are listed, but only abnormal results are displayed) Labs Reviewed  COMPREHENSIVE METABOLIC PANEL WITH GFR  CBC WITH DIFFERENTIAL/PLATELET  PRO BRAIN NATRIURETIC PEPTIDE  D-DIMER, QUANTITATIVE  TROPONIN T, HIGH SENSITIVITY    EKG: None  Radiology: No results found.  {Document cardiac monitor, telemetry assessment procedure when appropriate:32947} Procedures   Medications Ordered in the ED - No data to display    {Click here for ABCD2, HEART and other calculators REFRESH Note before signing:1}                              Medical Decision Making Amount and/or Complexity of Data Reviewed Labs: ordered.   This patient complains of ***; this involves an extensive number of treatment Options and is a complaint that carries with it a high risk of complications and morbidity. The differential includes ***  I ordered, reviewed and interpreted labs, which included *** I ordered medication *** and reviewed PMP when indicated. I ordered imaging studies which included *** and I independently    visualized and interpreted imaging which showed *** Additional history obtained from *** Previous records obtained and reviewed *** I consulted *** and discussed lab and imaging findings and discussed disposition.  Cardiac monitoring reviewed, *** Social determinants considered, *** Critical Interventions: ***  After the interventions stated above, I reevaluated the patient and found *** Admission and further testing considered, ***   {Document critical care time when appropriate  Document review of labs and clinical decision tools ie CHADS2VASC2, etc  Document your independent review of radiology images and any outside records  Document your discussion with family members, caretakers and with consultants  Document social determinants of health  affecting pt's care  Document your decision making why or why not admission, treatments were needed:32947:::1}   Final diagnoses:  None    ED Discharge Orders     None

## 2024-02-15 LAB — TROPONIN T, HIGH SENSITIVITY: Troponin T High Sensitivity: <15 ng/L (ref 0–19)

## 2024-02-15 MED ORDER — POTASSIUM CHLORIDE CRYS ER 20 MEQ PO TBCR
40.0000 meq | EXTENDED_RELEASE_TABLET | Freq: Once | ORAL | Status: AC
  Administered 2024-02-15: 40 meq via ORAL
  Filled 2024-02-15: qty 2

## 2024-02-15 NOTE — Discharge Instructions (Signed)
 You were evaluated in the Emergency Department and after careful evaluation, we did not find any emergent condition requiring admission or further testing in the hospital.  Your testing has shown concern for lung cancer that has spread to other areas.  You will need further testing and likely a biopsy by an oncologist.  Please call Dr. Armanda office first thing Monday morning.  We will message Dr. Davonna so that she is aware of your case.  Can continue your tramadol  at home as needed for pain.  Please return to the Emergency Department if you experience any worsening of your condition.   Thank you for allowing us  to be a part of your care.

## 2024-02-17 ENCOUNTER — Inpatient Hospital Stay: Attending: Oncology | Admitting: Oncology

## 2024-02-17 ENCOUNTER — Inpatient Hospital Stay

## 2024-02-17 VITALS — BP 116/79 | HR 71 | Temp 97.1°F | Resp 18 | Ht 65.0 in | Wt 139.0 lb

## 2024-02-17 DIAGNOSIS — Z800 Family history of malignant neoplasm of digestive organs: Secondary | ICD-10-CM | POA: Diagnosis not present

## 2024-02-17 DIAGNOSIS — F17210 Nicotine dependence, cigarettes, uncomplicated: Secondary | ICD-10-CM | POA: Diagnosis not present

## 2024-02-17 DIAGNOSIS — E876 Hypokalemia: Secondary | ICD-10-CM | POA: Diagnosis not present

## 2024-02-17 DIAGNOSIS — G893 Neoplasm related pain (acute) (chronic): Secondary | ICD-10-CM | POA: Diagnosis not present

## 2024-02-17 DIAGNOSIS — R911 Solitary pulmonary nodule: Secondary | ICD-10-CM | POA: Diagnosis not present

## 2024-02-17 DIAGNOSIS — R918 Other nonspecific abnormal finding of lung field: Secondary | ICD-10-CM | POA: Insufficient documentation

## 2024-02-17 LAB — COMPREHENSIVE METABOLIC PANEL WITH GFR
ALT: 24 U/L (ref 0–44)
AST: 20 U/L (ref 15–41)
Albumin: 4.4 g/dL (ref 3.5–5.0)
Alkaline Phosphatase: 137 U/L — ABNORMAL HIGH (ref 38–126)
Anion gap: 11 (ref 5–15)
BUN: 8 mg/dL (ref 6–20)
CO2: 29 mmol/L (ref 22–32)
Calcium: 10.3 mg/dL (ref 8.9–10.3)
Chloride: 101 mmol/L (ref 98–111)
Creatinine, Ser: 0.71 mg/dL (ref 0.61–1.24)
GFR, Estimated: >60 mL/min (ref >60)
Glucose, Bld: 92 mg/dL (ref 70–99)
Potassium: 4.2 mmol/L (ref 3.5–5.1)
Sodium: 141 mmol/L (ref 135–145)
Total Bilirubin: 0.4 mg/dL (ref 0.0–1.2)
Total Protein: 8 g/dL (ref 6.5–8.1)

## 2024-02-17 LAB — CBC WITH DIFFERENTIAL/PLATELET
Abs Immature Granulocytes: 0.05 K/uL (ref 0.00–0.07)
Basophils Absolute: 0.1 K/uL (ref 0.0–0.1)
Basophils Relative: 1 %
Eosinophils Absolute: 0.4 K/uL (ref 0.0–0.5)
Eosinophils Relative: 4 %
HCT: 48.9 % (ref 39.0–52.0)
Hemoglobin: 15.8 g/dL (ref 13.0–17.0)
Immature Granulocytes: 1 %
Lymphocytes Relative: 18 %
Lymphs Abs: 2 K/uL (ref 0.7–4.0)
MCH: 28.8 pg (ref 26.0–34.0)
MCHC: 32.3 g/dL (ref 30.0–36.0)
MCV: 89.2 fL (ref 80.0–100.0)
Monocytes Absolute: 0.8 K/uL (ref 0.1–1.0)
Monocytes Relative: 8 %
Neutro Abs: 7.5 K/uL (ref 1.7–7.7)
Neutrophils Relative %: 68 %
Platelets: 387 K/uL (ref 150–400)
RBC: 5.48 MIL/uL (ref 4.22–5.81)
RDW: 13.2 % (ref 11.5–15.5)
WBC: 10.8 K/uL — ABNORMAL HIGH (ref 4.0–10.5)
nRBC: 0 % (ref 0.0–0.2)

## 2024-02-17 MED ORDER — OXYCODONE HCL 5 MG PO TABS: 5.0000 mg | ORAL_TABLET | Freq: Four times a day (QID) | ORAL | 0 refills | Status: DC | PRN

## 2024-02-17 NOTE — Patient Instructions (Addendum)
 [facility] - [facility]  Discharge Instructions   Rapid Diagnostic Service Visit Discharge Information and Instructions  Thank you for choosing [facility] for your healthcare needs.  Below is a summary of today's discussion, along with our contact information and an outline of what to expect next.  Reason for Visit:  Lung mass/soft tissue mass/rectal mass  Proposed Diagnostic Care Plan: Labs today. We will schedule you for a PET scan. We will schedule you for a MRI of your brain. We will schedule you for a biopsy of the soft tissue mass on your R gluteal. We will send in a prescription for your pain.  What to Expect: - Generally, when lab tests are ordered the results can take up to 1 week for results to be available.  At that point, we will contact you to discuss your results with you.  Unless there is a critical result, we will typically wait for all of your lab results to be available before contacting you. - If a biopsy is part of your Care Plan, those results can take on average 7-10 days to result.  Once results are available, we will contact you to discuss your pathology results and any next steps. - If you have additional imaging ordered, such as a CT Scan, MRI, Ultrasound, Bone Scan, or PET scan, your imaging will need to be authorized then scheduled with the earliest available appointment.  You may be asked to travel to another hospital within [facility] who has a sooner availability, please consider doing so if asked. - If you use MyChart, your results will be available to you in the MyChart portal.  Your provider will be in touch with you as soon as all of your results are available to be discussed.  Your Diagnostic Clinic Provider:  Delon Hope, NP. Your Diagnostic Navigator:  Dena Daring, RN. Contact number [phone].  If you or your caregiver have number blocking on your cell phones, please ensure the cancer center's numbers are not  blocked.  If you are not a registered MyChart user, please consider enrolling in MyChart to receive your test results and visit notes.  You can also access your discharge instructions electronically.  MyChart also gives you an electronic means to communicate with your Care Team instead of needing to call in to the cancer center.  We appreciate you trusting us  with your healthcare and look forward to partnering with you as we work to uncover what your potential diagnosis may be.  Please do not hesitate to reach out at any point with questions or concerns.     Thank you for choosing [facility] - [name] to provide your oncology and hematology care.   To afford each patient quality time with our provider, please arrive at least 15 minutes before your scheduled appointment time. You may need to reschedule your appointment if you arrive late (10 or more minutes). Arriving late affects you and other patients whose appointments are after yours.  Also, if you miss three or more appointments without notifying the office, you may be dismissed from the clinic at the provider's discretion.    Again, thank you for choosing [facility].  Our hope is that these requests will decrease the amount of time that you wait before being seen by our physicians.   If you have a lab appointment with the Cancer Center - please note that after April 8th, all labs will be drawn in the  cancer center.  You do not have to check in or register with the main entrance as you have in the past but will complete your check-in at the cancer center.            _____________________________________________________________  Should you have questions after your visit to [facility], please contact our office at [phone] and follow the prompts.  Our office hours are 8:00 a.m. to 4:30 p.m. Monday - Thursday and 8:00 a.m. to 2:30 p.m. Friday.  Please note that voicemails left after 4:00 p.m.  may not be returned until the following business day.  We are closed weekends and all major holidays.  You do have access to a nurse 24-7, just call the main number to the clinic [phone] and do not press any options, hold on the line and a nurse will answer the phone.    For prescription refill requests, have your pharmacy contact our office and allow 72 hours.    Masks are no longer required in the cancer centers. If you would like for your care team to wear a mask while they are taking care of you, please let them know. You may have one support person who is at least 51 years old accompany you for your appointments.

## 2024-02-17 NOTE — Progress Notes (Unsigned)
 Jenna Cordella LABOR, MD  Baldwin Rosella L PROCEDURE / BIOPSY REVIEW Date: 02/17/24  Requested Biopsy site:  Right gluteal subcutaneous soft tissue mass Reason for request: likely metastatic disease Imaging review: Best seen on CT  Decision: Approved Imaging modality to perform: Ultrasound Schedule with: No sedation / Local anesthetic Schedule for: Any VIR  Additional comments: @Schedulers .  Please contact me with questions, concerns, or if issue pertaining to this request arise.  Cordella LABOR Jenna, MD [facility] Radiology       Previous Messages    ----- Message ----- From: Baldwin Rosella CROME Sent: 02/17/2024   9:08 AM EST To: Rosella CROME Baldwin; Taryn F Rigney, RT; Ir Proc* Subject: CT Biopsy                                      Procedure :CT Biopsy  Reason :lung mass/rectal mass/soft tissue mass Dx: Lung mass [R91.8 (ICD-10-CM)]    History :CT CERVICAL SPINE WO CONTRAST,CT HEAD WO CONTRAST ,CT Angio Chest/Abd/Pel for Dissection W and/or W/WO,DG Hip Unilat W OR W/O Pelvis 2-3 Views Left,DG Shoulder Left ,  Provider: Geofm Delon BRAVO, NP  Provider contact ;  [phone]  Pt is schedule for a NM PET Image Initial (PI) Skull Base To Thigh on 02/22/24 and a CT CHEST LUNG CA SCREEN LOW DOSE W/O CM on 03/08/24

## 2024-02-17 NOTE — Assessment & Plan Note (Addendum)
#  Lung mass: --Differentials include bronchogenic primary, metastatic lung lesion and benign lesion --Referral to pulmonology for evaluation of EBUS with biopsy --Recommend PET/CT scan for further evaluation and staging --Labs today to check CBC and CMP.  --RTC once workup is complete.
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
## 2024-02-18 ENCOUNTER — Telehealth: Payer: Self-pay

## 2024-02-18 LAB — CANCER ANTIGEN 19-9: CA 19-9: 8 U/mL (ref 0–35)

## 2024-02-18 LAB — CHROMOGRANIN A: Chromogranin A (ng/mL): 35.8 ng/mL (ref 0.0–101.8)

## 2024-02-18 LAB — CEA: CEA: 46 ng/mL — ABNORMAL HIGH (ref 0.0–4.7)

## 2024-02-18 NOTE — Telephone Encounter (Signed)
 Duanne Butler DASEN, MD  Angelena Ronal Slater MARLA, LPN I would like to see him to ensure we are getting treatment for his cancer arranged.  Spoke with patient. Pt states he is scheduled for a biopsy on the 21 st. Pt also states that he is scheduled for PET scan on the 23 rd and an MRI of his brain on the 24 th. Pt states he will give us  a call after Thanksgiving to make an appointment. Thanks.

## 2024-02-20 ENCOUNTER — Other Ambulatory Visit: Payer: Self-pay

## 2024-02-20 ENCOUNTER — Ambulatory Visit (HOSPITAL_COMMUNITY)
Admission: RE | Admit: 2024-02-20 | Discharge: 2024-02-20 | Disposition: A | Discharge status: Home / Self Care | Source: Ambulatory Visit | Attending: Oncology | Admitting: Oncology

## 2024-02-20 DIAGNOSIS — C7989 Secondary malignant neoplasm of other specified sites: Secondary | ICD-10-CM | POA: Insufficient documentation

## 2024-02-20 DIAGNOSIS — C3490 Malignant neoplasm of unspecified part of unspecified bronchus or lung: Secondary | ICD-10-CM | POA: Diagnosis not present

## 2024-02-20 DIAGNOSIS — M7989 Other specified soft tissue disorders: Secondary | ICD-10-CM | POA: Diagnosis not present

## 2024-02-20 DIAGNOSIS — R918 Other nonspecific abnormal finding of lung field: Secondary | ICD-10-CM

## 2024-02-20 DIAGNOSIS — G893 Neoplasm related pain (acute) (chronic): Secondary | ICD-10-CM

## 2024-02-20 DIAGNOSIS — R222 Localized swelling, mass and lump, trunk: Secondary | ICD-10-CM | POA: Diagnosis not present

## 2024-02-20 MED ORDER — LIDOCAINE HCL (PF) 1 % IJ SOLN
10.0000 mL | Freq: Once | INTRAMUSCULAR | Status: AC
  Administered 2024-02-20: 10 mL

## 2024-02-20 NOTE — Progress Notes (Signed)
 Patient contacted nurse navigator regarding a biopsy appointment.  During that call, patient expressed financial concerns surrounding the imaging and treatment of his new cancer diagnosis.  I made a referral to social work.

## 2024-02-20 NOTE — Procedures (Signed)
 Vascular and Interventional Radiology Procedure Note  Patient: Jeremy Hancock DOB: 29-Aug-1970 Medical Record Number: [id number] Note Date/Time: 02/20/24 1:11 PM   Performing Physician: Thom Hall, MD Assistant(s): None  Diagnosis: R gluteal mass. New DX L lung mass   Procedure: RIGHT GLUTEAL SUBCUTANEOUS MASS BIOPSY  Anesthesia: Local Anesthetic Complications: None Estimated Blood Loss: Minimal Specimens: Sent for Pathology  Findings:  Successful Ultrasound-guided biopsy of a R gluteal mass. A total of 3 samples were obtained. Hemostasis of the tract was achieved using Manual Pressure.   See detailed procedure note with images in PACS. The patient tolerated the procedure well without incident or complication and was returned to Recovery in stable condition.    Thom Hall, MD [facility]   Pager. [phone] Clinic. [phone]

## 2024-02-22 ENCOUNTER — Encounter (HOSPITAL_COMMUNITY)
Admission: RE | Admit: 2024-02-22 | Discharge: 2024-02-22 | Disposition: A | Discharge status: Home / Self Care | Source: Ambulatory Visit | Attending: Oncology | Admitting: Oncology

## 2024-02-22 DIAGNOSIS — R918 Other nonspecific abnormal finding of lung field: Secondary | ICD-10-CM | POA: Diagnosis not present

## 2024-02-22 DIAGNOSIS — C7951 Secondary malignant neoplasm of bone: Secondary | ICD-10-CM | POA: Diagnosis not present

## 2024-02-22 DIAGNOSIS — C771 Secondary and unspecified malignant neoplasm of intrathoracic lymph nodes: Secondary | ICD-10-CM | POA: Diagnosis not present

## 2024-02-22 MED ORDER — FLUDEOXYGLUCOSE F - 18 (FDG) INJECTION
6.9000 | Freq: Once | INTRAVENOUS | Status: AC | PRN
  Administered 2024-02-22: 6.9 via INTRAVENOUS

## 2024-02-23 ENCOUNTER — Other Ambulatory Visit: Payer: Self-pay | Admitting: *Deleted

## 2024-02-23 ENCOUNTER — Ambulatory Visit (HOSPITAL_COMMUNITY)
Admission: RE | Admit: 2024-02-23 | Discharge: 2024-02-23 | Disposition: A | Discharge status: Home / Self Care | Source: Ambulatory Visit | Attending: Oncology | Admitting: Oncology

## 2024-02-23 DIAGNOSIS — R918 Other nonspecific abnormal finding of lung field: Secondary | ICD-10-CM

## 2024-02-23 MED ORDER — GADOBUTROL 1 MMOL/ML IV SOLN
7.0000 mL | Freq: Once | INTRAVENOUS | Status: AC | PRN
  Administered 2024-02-23: 7 mL via INTRAVENOUS

## 2024-02-24 LAB — SURGICAL PATHOLOGY

## 2024-02-25 ENCOUNTER — Inpatient Hospital Stay: Admitting: Licensed Clinical Social Worker

## 2024-02-25 DIAGNOSIS — R918 Other nonspecific abnormal finding of lung field: Secondary | ICD-10-CM

## 2024-02-25 MED ORDER — OXYCODONE HCL 5 MG PO TABS: 5.0000 mg | ORAL_TABLET | Freq: Four times a day (QID) | ORAL | 0 refills | Status: DC | PRN

## 2024-02-27 NOTE — Progress Notes (Signed)
 [facility] Clinical Social Work  Initial Assessment   Jeremy Hancock is a 53 y.o. year old male contacted by phone. Clinical Social Work was referred by medical provider for assessment of psychosocial needs.   SDOH (Social Determinants of Health) assessments performed: Yes SDOH Interventions    Flowsheet Row Office Visit from 02/17/2024 in [facility] [place] Clinical Support from 09/11/2023 in [facility] Clinical Support from 08/08/2022 in [facility] Clinical Support from 05/04/2021 in [facility]  SDOH Interventions      Food Insecurity Interventions -- Intervention Not Indicated Intervention Not Indicated Intervention Not Indicated  Housing Interventions -- Intervention Not Indicated Intervention Not Indicated Intervention Not Indicated  Transportation Interventions -- Intervention Not Indicated Intervention Not Indicated Intervention Not Indicated  Utilities Interventions -- Intervention Not Indicated Intervention Not Indicated --  Alcohol Usage Interventions -- Intervention Not Indicated (Score <7) Intervention Not Indicated (Score <7) --  Depression Interventions/Treatment  PHQ2-9 Score <4 Follow-up Not Indicated -- -- --  Financial Strain Interventions -- Intervention Not Indicated -- Intervention Not Indicated  Physical Activity Interventions -- Intervention Not Indicated Intervention Not Indicated Patient Refused  Stress Interventions -- Intervention Not Indicated Intervention Not Indicated Intervention Not Indicated  Social Connections Interventions -- -- Intervention Not Indicated Intervention Not Indicated  Health Literacy Interventions -- Intervention Not Indicated -- --    SDOH Screenings   Food Insecurity: No Food Insecurity (02/17/2024)  Housing: Low Risk  (02/17/2024)  Transportation Needs: No Transportation Needs (02/17/2024)  Utilities: Not At Risk  (02/17/2024)  Alcohol Screen: Low Risk  (09/11/2023)  Depression (PHQ2-9): Low Risk  (02/17/2024)  Financial Resource Strain: Low Risk  (09/11/2023)  Physical Activity: Insufficiently Active (09/11/2023)  Social Connections: Moderately Isolated (08/08/2022)  Stress: No Stress Concern Present (09/11/2023)  Tobacco Use: High Risk (02/03/2024)  Health Literacy: Adequate Health Literacy (09/11/2023)    PHQ 2/9:    02/17/2024    8:22 AM 09/11/2023   10:31 AM 04/16/2023    2:39 PM  Depression screen PHQ 2/9  Decreased Interest 3 0 1  Down, Depressed, Hopeless 0 0 0  PHQ - 2 Score 3 0 1  Altered sleeping 1 1 2   Tired, decreased energy 0 0 3  Change in appetite 0 0 0  Feeling bad or failure about yourself  0 0 0  Trouble concentrating 0 0 0  Moving slowly or fidgety/restless 0 0 0  Suicidal thoughts 0 0 0  PHQ-9 Score 4 1  6    Difficult doing work/chores  Not difficult at all Not difficult at all     Data saved with a previous flowsheet row definition     Distress Screen completed: No     No data to display            Family/Social Information:  Housing Arrangement: patient lives with his father for whom he is the primary caretaker.  Pt's father has dementia.  Pt is independent in ADLs, but recently has needed to use a cane due to pain.  Family members/support persons in your life? Pt reports limited support, but his mother and step-father reportedly live nearby. Transportation concerns: no  Employment: Legally disabled .  Income source: Secretary/administrator concerns: No Type of concern: None Food access concerns: no Religious or spiritual practice: Yes-Christian Advanced directives: Not known Services Currently in place:  none  Coping/ Adjustment to  diagnosis: Patient understands treatment plan and what happens next? Pt reports he has not yet decided if he will undergo treatment as he has watched family members go through treatment and does not want to suffer.   Pt scheduled for chemo teaching and will decide following that appointment if he will engage in treatment. Concerns about diagnosis and/or treatment: Overwhelmed by information, How will I care for myself, and Quality of life Patient reported stressors: Adjusting to my illness, Facing my mortality, and Physical issues Hopes and/or priorities: pt's priority is to determine what his options are and move forward with the option that will maximize his quality of life. Patient enjoys time with family/ friends Current coping skills/ strengths: Capable of independent living  and Physical Health     SUMMARY: Current SDOH Barriers:  Limited social support  Clinical Social Work Clinical Goal(s):  No clinical social work goals at this time  Interventions: Discussed common feeling and emotions when being diagnosed with cancer, and the importance of support during treatment Informed patient of the support team roles and support services at [facility] Provided CSW contact information and encouraged patient to call with any questions or concerns Pt resides in [place].  Pt informed of transportation reimbursement available in [facility] for treatment.   Follow Up Plan: Patient will contact CSW with any support or resource needs Patient verbalizes understanding of plan: Yes    Devere JONELLE Manna, LCSW Clinical Social Worker [facility]

## 2024-03-01 ENCOUNTER — Other Ambulatory Visit: Payer: Self-pay

## 2024-03-01 DIAGNOSIS — K5900 Constipation, unspecified: Secondary | ICD-10-CM

## 2024-03-01 MED ORDER — LACTULOSE 20 GM/30ML PO SOLN: ORAL | 0 refills | Status: DC

## 2024-03-03 ENCOUNTER — Inpatient Hospital Stay: Attending: Oncology | Admitting: Oncology

## 2024-03-03 ENCOUNTER — Inpatient Hospital Stay: Admitting: Licensed Clinical Social Worker

## 2024-03-03 ENCOUNTER — Other Ambulatory Visit: Payer: Self-pay

## 2024-03-03 ENCOUNTER — Inpatient Hospital Stay

## 2024-03-03 VITALS — BP 107/81 | HR 85 | Temp 98.5°F | Resp 18 | Ht 65.0 in | Wt 140.0 lb

## 2024-03-03 DIAGNOSIS — Z87891 Personal history of nicotine dependence: Secondary | ICD-10-CM | POA: Insufficient documentation

## 2024-03-03 DIAGNOSIS — C3492 Malignant neoplasm of unspecified part of left bronchus or lung: Secondary | ICD-10-CM

## 2024-03-03 DIAGNOSIS — G893 Neoplasm related pain (acute) (chronic): Secondary | ICD-10-CM | POA: Insufficient documentation

## 2024-03-03 DIAGNOSIS — R918 Other nonspecific abnormal finding of lung field: Secondary | ICD-10-CM

## 2024-03-03 DIAGNOSIS — K5900 Constipation, unspecified: Secondary | ICD-10-CM

## 2024-03-03 DIAGNOSIS — K629 Disease of anus and rectum, unspecified: Secondary | ICD-10-CM

## 2024-03-03 DIAGNOSIS — Z5111 Encounter for antineoplastic chemotherapy: Secondary | ICD-10-CM | POA: Insufficient documentation

## 2024-03-03 DIAGNOSIS — C3490 Malignant neoplasm of unspecified part of unspecified bronchus or lung: Secondary | ICD-10-CM | POA: Insufficient documentation

## 2024-03-03 DIAGNOSIS — C7989 Secondary malignant neoplasm of other specified sites: Secondary | ICD-10-CM | POA: Insufficient documentation

## 2024-03-03 DIAGNOSIS — C7951 Secondary malignant neoplasm of bone: Secondary | ICD-10-CM | POA: Diagnosis not present

## 2024-03-03 DIAGNOSIS — K5903 Drug induced constipation: Secondary | ICD-10-CM | POA: Diagnosis not present

## 2024-03-03 DIAGNOSIS — F17210 Nicotine dependence, cigarettes, uncomplicated: Secondary | ICD-10-CM | POA: Diagnosis not present

## 2024-03-03 LAB — MISCELLANEOUS TEST

## 2024-03-03 MED ORDER — LIDOCAINE-PRILOCAINE 2.5-2.5 % EX CREA: 1.0000 | TOPICAL_CREAM | CUTANEOUS | 0 refills | Status: AC | PRN

## 2024-03-03 MED ORDER — LACTULOSE 20 GM/30ML PO SOLN: ORAL | 0 refills | Status: DC

## 2024-03-03 MED ORDER — OXYCODONE HCL 5 MG PO TABS: 10.0000 mg | ORAL_TABLET | Freq: Four times a day (QID) | ORAL | 0 refills | Status: DC | PRN
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
## 2024-03-03 NOTE — Patient Instructions (Addendum)
 [facility]  Discharge Instructions  You were seen and examined today by Dr. Davonna. Dr. Davonna is a medical oncologist, meaning that she specializes in the treatment of cancer diagnoses. Dr. Davonna discussed your past medical history, family history of cancers, and the events that led to you being here today.  You were referred to Dr. Davonna due to a new diagnosis of Stage IV Lung Cancer. This means that the cancer began in the lung but has spread. This means that it cannot be cured but it can be controlled with treatment.  Dr. Davonna has recommended treatment with chemotherapy.  Dr. Davonna has ordered additional testing on your biopsy known as Next Generation Sequencing (NGS) to see if there are additional medications we can add to the chemotherapy treatment plan.  For your left hip and shoulder pain, we will refer you to Radiation Oncology at [facility] for pain control.  For the rectal mass, Dr. Davonna will refer you to GI for colonoscopy.  Dr. Davonna has also recommended a Port-A-Cath placement so that we can safely administer chemotherapy.  Follow-up as scheduled.  Thank you for choosing [name] to provide your oncology and hematology care.   To afford each patient quality time with our provider, please arrive at least 15 minutes before your scheduled appointment time. You may need to reschedule your appointment if you arrive late (10 or more minutes). Arriving late affects you and other patients whose appointments are after yours.  Also, if you miss three or more appointments without notifying the office, you may be dismissed from the clinic at the provider's discretion.    Again, thank you for choosing [facility].  Our hope is that these requests will decrease the amount of time that you wait before being seen by our physicians.   If you have a lab appointment with the Cancer Center - please note that  after April 8th, all labs will be drawn in the cancer center.  You do not have to check in or register with the main entrance as you have in the past but will complete your check-in at the cancer center.            _____________________________________________________________  Should you have questions after your visit to [facility], please contact our office at [phone] and follow the prompts.  Our office hours are 8:00 a.m. to 4:30 p.m. Monday - Thursday and 8:00 a.m. to 2:30 p.m. Friday.  Please note that voicemails left after 4:00 p.m. may not be returned until the following business day.  We are closed weekends and all major holidays.  You do have access to a nurse 24-7, just call the main number to the clinic [phone] and do not press any options, hold on the line and a nurse will answer the phone.    For prescription refill requests, have your pharmacy contact our office and allow 72 hours.    Masks are no longer required in the cancer centers. If you would like for your care team to wear a mask while they are taking care of you, please let them know. You may have one support person who is at least 51 years old accompany you for your appointments.

## 2024-03-03 NOTE — Progress Notes (Signed)
 [facility] CSW Progress Note    Interventions: Pt requested to speak w/ CSW at consultation appointment regarding financial assistance.  CSW explained the [employer] criteria to pt and encouraged him to apply for SNAP benefits.  Pt's sister requested a link be sent to her to assist pt w/ applying for SNAP benefits online.  Link emailed as well as a link to [employer] to look for grants to assist w/ co-pays.        Follow Up Plan:  Patient will contact CSW with any support or resource needs    Devere JONELLE Manna, LCSW Clinical Social Worker [facility]

## 2024-03-04 ENCOUNTER — Encounter: Payer: Self-pay | Admitting: *Deleted

## 2024-03-04 ENCOUNTER — Encounter: Payer: Self-pay | Admitting: Gastroenterology

## 2024-03-04 ENCOUNTER — Encounter: Payer: Self-pay | Admitting: Oncology

## 2024-03-04 DIAGNOSIS — C3492 Malignant neoplasm of unspecified part of left bronchus or lung: Secondary | ICD-10-CM | POA: Insufficient documentation

## 2024-03-04 NOTE — Progress Notes (Signed)
 Oxycodone  5 mg tablets approved by [employer] from 04/02/2023-03/31/2024.

## 2024-03-04 NOTE — Progress Notes (Signed)
START ON PATHWAY REGIMEN - Non-Small Cell Lung     A cycle is every 21 days:     Pemetrexed      Carboplatin   **Always confirm dose/schedule in your pharmacy ordering system**  Patient Characteristics: Stage IV Metastatic, Nonsquamous, Awaiting Molecular Test Results and Need to Start Chemotherapy, PS = 0, 1 Therapeutic Status: Stage IV Metastatic Histology: Nonsquamous Cell Broad Molecular Profiling Status: Awaiting Molecular Test Results and Need to Start Chemotherapy ECOG Performance Status: 0 Intent of Therapy: Non-Curative / Palliative Intent, Discussed with Patient

## 2024-03-05 ENCOUNTER — Other Ambulatory Visit: Payer: Self-pay

## 2024-03-05 MED ORDER — LIDOCAINE-PRILOCAINE 2.5-2.5 % EX CREA: TOPICAL_CREAM | CUTANEOUS | 3 refills | Status: DC

## 2024-03-05 MED ORDER — DEXAMETHASONE 4 MG PO TABS: ORAL_TABLET | ORAL | 1 refills | Status: DC

## 2024-03-05 MED ORDER — PROCHLORPERAZINE MALEATE 10 MG PO TABS: 10.0000 mg | ORAL_TABLET | Freq: Four times a day (QID) | ORAL | 1 refills | Status: DC | PRN

## 2024-03-05 MED ORDER — FOLIC ACID 1 MG PO TABS: 1.0000 mg | ORAL_TABLET | Freq: Every day | ORAL | 3 refills | Status: DC

## 2024-03-05 MED ORDER — ONDANSETRON HCL 8 MG PO TABS: 8.0000 mg | ORAL_TABLET | Freq: Three times a day (TID) | ORAL | 1 refills | Status: DC | PRN

## 2024-03-05 NOTE — Progress Notes (Signed)
 Patient for IR Port Placement on Monday 03/08/24, I called and spoke with the patient on the phone and gave pre-procedure instructions. Pt was made aware to be here at 1p, NPO after MN prior to procedure as well as driver post procedure/recovery/discharge. Pt stated understanding.  Called 03/04/24

## 2024-03-05 NOTE — Patient Instructions (Signed)
 [facility] Chemotherapy Teaching  You have been diagnosed with lung cancer by your oncologist and will receive the treatment Alimta and Carboplatin every three weeks.  The goal is curative.    You will see the doctor regularly throughout treatment.  We will obtain blood work from you prior to every treatment and monitor your results to make sure it is safe to give your treatment. The doctor monitors your response to treatment by the way you are feeling, your blood work, and by obtaining scans periodically.  There will be wait times while you are here for treatment.  It will take about 30 minutes to 1 hour for your lab work to result.  Then there will be wait times while pharmacy mixes your medications.   You must take folic acid  every day by mouth beginning 7 days before your first dose of alimta.  You must keep taking folic acid  every day during the time you are being treated with alimta, and for every day for 21 days after you receive your last alimta dose.    You will get B12 injections while you are on alimta.  The first one about 1 week prior to starting treatment and then about every 9 weeks during treatment.   Medications you will receive in the clinic prior to your chemotherapy medications:  Aloxi:  ALOXI is used in adults to help prevent nausea and vomiting that happens with certain chemotherapy drugs.  Aloxi is a long acting medication, and will remain in your system for about two days.   Emend:  This is an anti-nausea medication that is used with Aloxi to help prevent nausea and vomiting caused by chemotherapy.  Dexamethasone :  This is a steroid given prior to chemotherapy to help prevent allergic reactions; it may also help prevent and control nausea and diarrhea.    Carboplatin (Generic Name) Other Names: Paraplatin, CBDCA  About This Drug Carboplatin is a drug used to treat cancer. This drug is given in the vein (IV).  Takes 30 minutes to infuse.   Possible Side  Effects (More Common)  Nausea and throwing up (vomiting). These symptoms may happen within a few hours after your treatment and may last up to 24 hours. Medicines are available to stop or lessen these side effects.   Bone marrow depression. This is a decrease in the number of white blood cells, red blood cells, and platelets. This may raise your risk of infection, make you tired and weak (fatigue), and raise your risk of bleeding.   Soreness of the mouth and throat. You may have red areas, white patches, or sores that hurt.   This drug may affect how your kidneys work. Your kidney function will be checked as needed.   Electrolyte changes. Your blood will be checked for electrolyte changes as needed.  Possible Side Effects (Less Common)   Hair loss. Some patients lose their hair on the scalp and body. You may notice your hair thinning seven to 14 days after getting this drug.   Effects on the nerves are called peripheral neuropathy. You may feel numbness, tingling, or pain in your hands and feet. It may be hard for you to button your clothes, open jars, or walk as usual. The effect on the nerves may get worse with more doses of the drug. These effects get better in some people after the drug is stopped but it does not get better in all people.   Loose bowel movements (diarrhea) that may last  for several days   Decreased hearing or ringing in the ears   Changes in the way food and drinks taste   Changes in liver function. Your liver function will be checked as needed  Allergic Reactions Serious allergic reactions including anaphylaxis are rare. While you are getting this drug in your vein (IV), tell your nurse right away if you have any of these symptoms of an allergic reaction:   Trouble catching your breath   Feeling like your tongue or throat are swelling   Feeling your heart beat quickly or in a not normal way (palpitations)   Feeling dizzy or lightheaded   Flushing, itching,  rash, and/or hives  Treating Side Effects   Drink 6-8 cups of fluids each day unless your doctor has told you to limit your fluid intake due to some other health problem. A cup is 8 ounces of fluid. If you throw up or have loose bowel movements, you should drink more fluids so that you do not become dehydrated (lack water in the body from losing too much fluid).   Mouth care is very important. Your mouth care should consist of routine, gentle cleaning of your teeth or dentures and rinsing your mouth with a mixture of 1/2 teaspoon of salt in 8 ounces of water or  teaspoon of baking soda in 8 ounces of water. This should be done at least after each meal and at bedtime.   If you have mouth sores, avoid mouthwash that has alcohol. Avoid alcohol and smoking because they can bother your mouth and throat.   If you have numbness and tingling in your hands and feet, be careful when cooking, walking, and handling sharp objects and hot liquids.   Talk with your nurse about getting a wig before you lose your hair. Also, call the [employer] at 800-ACS-2345 to find out information about the "Look Good, Feel Better" program close to where you live. It is a free program where women getting chemotherapy can learn about wigs, turbans and scarves as well as makeup techniques and skin and nail care.  Food and Drug Interactions There are no known interactions of carboplatin with food. This drug may interact with other medicines. Tell your doctor and pharmacist about all the medicines and dietary supplements (vitamins, minerals, herbs and others) that you are taking at this time. The safety and use of dietary supplements and alternative diets are often not known. Using these might affect your cancer or interfere with your treatment. Until more is known, you should not use dietary supplements or alternative diets without your cancer doctor's help.  When to Call the Doctor Call your doctor or nurse right  away if you have any of these symptoms:   Fever of 100.4 F (38 C) or above; chills   Bleeding or bruising that is not normal   Wheezing or trouble breathing   Nausea that stops you from eating or drinking   Vomiting   Rash or itching   Loose bowel movements (diarrhea) more than four times a day or diarrhea with weakness or feeling lightheaded  Call your doctor or nurse as soon as possible if any of these symptoms happen:   Numbness, tingling, decreased feeling or weakness in fingers, toes, arms, or legs   Change in hearing, ringing in the ears   Blurred vision or other changes in eyesight   Decreased urine   Yellowing of skin or eyes  Sexual Problems and Reproductive Concerns Sexual problems and reproduction  concerns may happen. In both men and women, this drug may affect your ability to have children. This cannot be determined before your treatment. Talk with your doctor or nurse if you plan to have children. Ask for information on sperm or egg banking. In men, this drug may interfere with your ability to make sperm, but it should not change your ability to have sexual relations. In women, menstrual bleeding may become irregular or stop while you are getting this drug. Do not assume that you cannot become pregnant if you do not have a menstrual period. Women may go through signs of menopause (change of life) like vaginal dryness or itching. Vaginal lubricants can be used to lessen vaginal dryness, itching, and pain during sexual relations. Genetic counseling is available for you to talk about the effects of this drug therapy on future pregnancies. Also, a genetic counselor can look at the possible risk of problems in the unborn baby due to this medicine if an exposure happens during pregnancy.   Pregnancy warning: This drug may have harmful effects on the unborn child, so effective methods of birth control should be used during your cancer treatment.   Breast feeding warning: It is  not known if this drug passes into breast milk. For this reason, women should talk to their doctor about the risks and benefits of breast feeding during treatment with this drug because this drug may enter the breast milk and badly harm a breast feeding baby.   Pemetrexed (Generic Name) Other Names: ALIMTA  About This Drug Pemetrexed is used to treat cancer. It is given in the vein (IV).  Takes 10 minutes to infuse.   Possible Side Effects (More Common)   Bone marrow depression. This is a decrease in the number of white blood cells, red blood cells, and platelets. This may raise your risk of infection, make you feel tired and weak (fatigue), and raise your risk of bleeding.   Fatigue   Soreness of the mouth and throat. You may have red areas, white patches, or sores that hurt.  Possible Side Effects (less common)   Trouble breathing or feeling short of breath   Nausea and vomiting   Skin rash  Treating Side Effects   Ask your doctor or nurse about medicine that is available to help stop or lessen nausea and throwing up.   If you get a rash, do not put anything on it unless your doctor or nurse says you may. Keep the area around the rash clean and dry.   Mouth care is very important. Your mouth care should consist of routine, gentle cleaning of your teeth or dentures and rinsing your mouth with a mixture of 1/2 teaspoon of salt in 8 ounces of water or  teaspoon of baking soda in 8 ounces of water. This should be done at least after each meal and at bedtime.   If you have mouth sores, avoid mouthwash that has alcohol. Avoid alcohol and smoking because they can bother your mouth and throat.  Food and Drug Interactions There are no known interactions of this medicine with food. Tell your doctor if you are taking ibuprofen . Pemetrexed may interact with other medicines. Tell your doctor and pharmacist about all the medicines and dietary supplements (vitamins, minerals, herbs and  others) that you are taking at this time. The safety and use of dietary supplements and alternative diets are often not known. Using these might affect your cancer or interfere with your treatment. Until more is  known, you should not use dietary supplements or alternative diets without your cancer doctor's help.  When to Call the Doctor Call your doctor or nurse right away if you have any of these symptoms:   Temperature of 100.4 F (38 C) or above   Chills   Easy bruising or bleeding   Trouble breathing   Chest pain   Nausea that stops you from eating or drinking   Throwing up/vomiting  Call your doctor or nurse as soon as possible if you have any of these symptoms:   Rash that does not go away with prescribed medicine   Nausea or vomiting that does not go away with prescribed medicine   Extreme fatigue that interferes with normal activities  Reproduction Concerns  Pregnancy warning: This drug may have harmful effects on the unborn child, so effective methods of birth control should be used during your cancer treatment.   Genetic counseling is available for you to talk about the effects of this drug therapy on future pregnancies. Also, a genetic counselor can look at the possible risk of problems in the unborn baby due to this medicine if an exposure happens during pregnancy.   Breast feeding warning: It is not known if this drug passes into breast milk. For this reason, women should talk to their doctor about the risks and benefits of breast feeding during treatment with this drug because this drug may enter the breast milk and badly harm a breast feeding baby.   SELF CARE ACTIVITIES WHILE RECEIVING CHEMOTHERAPY:  Hydration Increase your fluid intake 48 hours prior to treatment and drink at least 8 to 12 cups (64 ounces) of water/decaffeinated beverages per day after treatment. You can still have your cup of coffee or soda but these beverages do not count as part of your 8 to  12 cups that you need to drink daily. No alcohol intake.  Medications Continue taking your normal prescription medication as prescribed.  If you start any new herbal or new supplements please let us  know first to make sure it is safe.  Mouth Care Have teeth cleaned professionally before starting treatment. Keep dentures and partial plates clean. Use soft toothbrush and do not use mouthwashes that contain alcohol. Biotene is a good mouthwash that is available at most pharmacies or may be ordered by calling [phone]. Use warm salt water gargles (1 teaspoon salt per 1 quart warm water) before and after meals and at bedtime. If you need dental work, please let the doctor know before you go for your appointment so that we can coordinate the best possible time for you in regards to your chemo regimen. You need to also let your dentist know that you are actively taking chemo. We may need to do labs prior to your dental appointment.  Skin Care Always use sunscreen that has not expired and with SPF (Sun Protection Factor) of 50 or higher. Wear hats to protect your head from the sun. Remember to use sunscreen on your hands, ears, face, & feet.  Use good moisturizing lotions such as udder cream, eucerin, or even Vaseline. Some chemotherapies can cause dry skin, color changes in your skin and nails.    Avoid long, hot showers or baths. Use gentle, fragrance-free soaps and laundry detergent. Use moisturizers, preferably creams or ointments rather than lotions because the thicker consistency is better at preventing skin dehydration. Apply the cream or ointment within 15 minutes of showering. Reapply moisturizer at night, and moisturize your hands every time  after you wash them.  Hair Loss (if your doctor says your hair will fall out)  If your doctor says that your hair is likely to fall out, decide before you begin chemo whether you want to wear a wig. You may want to shop before treatment to match your  hair color. Hats, turbans, and scarves can also camouflage hair loss, although some people prefer to leave their heads uncovered. If you go bare-headed outdoors, be sure to use sunscreen on your scalp. Cut your hair short. It eases the inconvenience of shedding lots of hair, but it also can reduce the emotional impact of watching your hair fall out. Don't perm or color your hair during chemotherapy. Those chemical treatments are already damaging to hair and can enhance hair loss. Once your chemo treatments are done and your hair has grown back, it's OK to resume dyeing or perming hair.  With chemotherapy, hair loss is almost always temporary. But when it grows back, it may be a different color or texture. In older adults who still had hair color before chemotherapy, the new growth may be completely gray.  Often, new hair is very fine and soft.  Infection Prevention Please wash your hands for at least 30 seconds using warm soapy water. Handwashing is the #1 way to prevent the spread of germs. Stay away from sick people or people who are getting over a cold. If you develop respiratory systems such as green/yellow mucus production or productive cough or persistent cough let us  know and we will see if you need an antibiotic. It is a good idea to keep a pair of gloves on when going into grocery stores/[employer] to decrease your risk of coming into contact with germs on the carts, etc. Carry alcohol hand gel with you at all times and use it frequently if out in public. If your temperature reaches 100.5 or higher please call the clinic and let us  know.  If it is after hours or on the weekend please go to the ER if your temperature is over 100.5.  Please have your own personal thermometer at home to use.    Sex and bodily fluids If you are going to have sex, a condom must be used to protect the person that isn't taking chemotherapy. Chemo can decrease your libido (sex drive). For a few days after chemotherapy,  chemotherapy can be excreted through your bodily fluids.  When using the toilet please close the lid and flush the toilet twice.  Do this for a few day after you have had chemotherapy.   Effects of chemotherapy on your sex life Some changes are simple and won't last long. They won't affect your sex life permanently.  Sometimes you may feel: too tired not strong enough to be very active sick or sore  not in the mood anxious or low  Your anxiety might not seem related to sex. For example, you may be worried about the cancer and how your treatment is going. Or you may be worried about money, or about how you family are coping with your illness.  These things can cause stress, which can affect your interest in sex. It's important to talk to your partner about how you feel. Remember - the changes to your sex life don't usually last long. There's usually no medical reason to stop having sex during chemo. The drugs won't have any long term physical effects on your performance or enjoyment of sex. Cancer can't be passed on to your partner  during sex  Contraception It's important to use reliable contraception during treatment. Avoid getting pregnant while you or your partner are having chemotherapy. This is because the drugs may harm the baby. Sometimes chemotherapy drugs can leave a man or woman infertile.  This means you would not be able to have children in the future. You might want to talk to someone about permanent infertility. It can be very difficult to learn that you may no longer be able to have children. Some people find counselling helpful.  There might be ways to preserve your fertility, although this is easier for men than for women. You may want to speak to a fertility expert. You can talk about sperm banking or harvesting your eggs. You can also ask about other fertility options, such as donor eggs.  If you have or have had breast cancer, your doctor might advise you not to take the  contraceptive pill. This is because the hormones in it might affect the cancer.  It is not known for sure whether or not chemotherapy drugs can be passed on through semen or secretions from the vagina. Because of this some doctors advise people to use a barrier method if you have sex during treatment. This applies to vaginal, anal or oral sex.  Generally, doctors advise a barrier method only for the time you are actually having the treatment and for about a week after your treatment.  Advice like this can be worrying, but this does not mean that you have to avoid being intimate with your partner. You can still have close contact with your partner and continue to enjoy sex.  Animals If you have cats or birds we just ask that you not change the litter or change the cage.  Please have someone else do this for you while you are on chemotherapy.   Food Safety During and After Cancer Treatment Food safety is important for people both during and after cancer treatment. Cancer and cancer treatments, such as chemotherapy, radiation therapy, and stem cell/bone marrow transplantation, often weaken the immune system. This makes it harder for your body to protect itself from foodborne illness, also called food poisoning. Foodborne illness is caused by eating food that contains harmful bacteria, parasites, or viruses.  Foods to avoid Some foods have a higher risk of becoming tainted with bacteria. These include: Unwashed fresh fruit and vegetables, especially leafy vegetables that can hide dirt and other contaminants Raw sprouts, such as alfalfa sprouts Raw or undercooked beef, especially ground beef, or other raw or undercooked meat and poultry Fatty, fried, or spicy foods immediately before or after treatment.  These can sit heavy on your stomach and make you feel nauseous. Raw or undercooked shellfish, such as oysters. Sushi and sashimi, which often contain raw fish.  Unpasteurized beverages, such as  unpasteurized fruit juices, raw milk, raw yogurt, or cider Undercooked eggs, such as soft boiled, over easy, and poached; raw, unpasteurized eggs; or foods made with raw egg, such as homemade raw cookie dough and homemade mayonnaise  Simple steps for food safety  Shop smart. Do not buy food stored or displayed in an unclean area. Do not buy bruised or damaged fruits or vegetables. Do not buy cans that have cracks, dents, or bulges. Pick up foods that can spoil at the end of your shopping trip and store them in a cooler on the way home.  Prepare and clean up foods carefully. Rinse all fresh fruits and vegetables under running water, and dry them  with a clean towel or paper towel. Clean the top of cans before opening them. After preparing food, wash your hands for 20 seconds with hot water and soap. Pay special attention to areas between fingers and under nails. Clean your utensils and dishes with hot water and soap. Disinfect your kitchen and cutting boards using 1 teaspoon of liquid, unscented bleach mixed into 1 quart of water.    Dispose of old food. Eat canned and packaged food before its expiration date (the "use by" or "best before" date). Consume refrigerated leftovers within 3 to 4 days. After that time, throw out the food. Even if the food does not smell or look spoiled, it still may be unsafe. Some bacteria, such as Listeria, can grow even on foods stored in the refrigerator if they are kept for too long.  Take precautions when eating out. At restaurants, avoid buffets and salad bars where food sits out for a long time and comes in contact with many people. Food can become contaminated when someone with a virus, often a norovirus, or another "bug" handles it. Put any leftover food in a "to-go" container yourself, rather than having the server do it. And, refrigerate leftovers as soon as you get home. Choose restaurants that are clean and that are willing to prepare your food as you  order it cooked.   AT HOME MEDICATIONS:                                                                                                                                                                Compazine /Prochlorperazine  10mg  tablet. Take 1 tablet every 6 hours as needed for nausea/vomiting. (This can make you sleepy)   EMLA  cream. Apply a quarter size amount to port site 1 hour prior to chemo. Do not rub in. Cover with plastic wrap.    Diarrhea Sheet   If you are having loose stools/diarrhea, please purchase Imodium and begin taking as outlined:  At the first sign of poorly formed or loose stools you should begin taking Imodium (loperamide) 2 mg capsules.  Take two tablets (4mg ) followed by one tablet (2mg ) every 2 hours - DO NOT EXCEED 8 tablets in 24 hours.  If it is bedtime and you are having loose stools, take 2 tablets at bedtime, then 2 tablets every 4 hours until morning.   Always call the Cancer Center if you are having loose stools/diarrhea that you can't get under control.  Loose stools/diarrhea leads to dehydration (loss of water) in your body.  We have other options of trying to get the loose stools/diarrhea to stop but you must let us  know!   Constipation Sheet  Colace - 100 mg capsules - take 2 capsules daily.  If this doesn't help then you can increase  to 2 capsules twice daily.  Please call if the above does not work for you. Do not go more than 2 days without a bowel movement.  It is very important that you do not become constipated.  It will make you feel sick to your stomach (nausea) and can cause abdominal pain and vomiting.  Nausea Sheet   Compazine /Prochlorperazine  10mg  tablet. Take 1 tablet every 6 hours as needed for nausea/vomiting (This can make you drowsy).  If you are having persistent nausea (nausea that does not stop) please call the Cancer Center and let us  know the amount of nausea that you are experiencing.  If you begin to vomit, you need to call  the Cancer Center and if it is the weekend and you have vomited more than one time and can't get it to stop, go to the Emergency Room.  Persistent nausea/vomiting can lead to dehydration (loss of fluid in your body) and will make you feel very weak and unwell. Ice chips, sips of clear liquids, foods that are at room temperature, crackers, and toast tend to be better tolerated.   SYMPTOMS TO REPORT AS SOON AS POSSIBLE AFTER TREATMENT:  FEVER GREATER THAN 100.5 F  CHILLS WITH OR WITHOUT FEVER  NAUSEA AND VOMITING THAT IS NOT CONTROLLED WITH YOUR NAUSEA MEDICATION  UNUSUAL SHORTNESS OF BREATH  UNUSUAL BRUISING OR BLEEDING  TENDERNESS IN MOUTH AND THROAT WITH OR WITHOUT   PRESENCE OF ULCERS  URINARY PROBLEMS  BOWEL PROBLEMS  UNUSUAL RASH    Wear comfortable clothing and clothing appropriate for easy access to any Portacath or PICC line. Let us  know if there is anything that we can do to make your therapy better!   What to do if you need assistance after hours or on the weekends: CALL [phone].  HOLD on the line, do not hang up.  You will hear multiple messages but at the end you will be connected with a nurse triage line.  They will contact the doctor if necessary.  Most of the time they will be able to assist you.  Do not call the hospital operator.     I have been informed and understand all of the instructions given to me and have received a copy. I have been instructed to call the clinic [phone] or my family physician as soon as possible for continued medical care, if indicated. I do not have any more questions at this time but understand that I may call the Cancer Center or the Patient Navigator at [phone] during office hours should I have questions or need assistance in obtaining follow-up care.

## 2024-03-07 ENCOUNTER — Other Ambulatory Visit: Payer: Self-pay | Admitting: Radiology

## 2024-03-08 ENCOUNTER — Emergency Department (HOSPITAL_COMMUNITY)

## 2024-03-08 ENCOUNTER — Inpatient Hospital Stay

## 2024-03-08 ENCOUNTER — Encounter (HOSPITAL_COMMUNITY): Payer: Self-pay

## 2024-03-08 ENCOUNTER — Telehealth: Payer: Self-pay | Admitting: Radiation Oncology

## 2024-03-08 ENCOUNTER — Ambulatory Visit: Admission: RE | Admit: 2024-03-08 | Discharge: 2024-03-08 | Attending: Oncology | Admitting: Radiology

## 2024-03-08 ENCOUNTER — Other Ambulatory Visit

## 2024-03-08 ENCOUNTER — Emergency Department (HOSPITAL_COMMUNITY)
Admission: EM | Admit: 2024-03-08 | Discharge: 2024-03-08 | Disposition: A | Discharge status: Home / Self Care | Attending: Emergency Medicine | Admitting: Emergency Medicine

## 2024-03-08 ENCOUNTER — Inpatient Hospital Stay: Admitting: Hematology and Oncology

## 2024-03-08 ENCOUNTER — Other Ambulatory Visit: Payer: Self-pay

## 2024-03-08 DIAGNOSIS — C7951 Secondary malignant neoplasm of bone: Secondary | ICD-10-CM | POA: Diagnosis not present

## 2024-03-08 DIAGNOSIS — R0602 Shortness of breath: Secondary | ICD-10-CM | POA: Diagnosis not present

## 2024-03-08 DIAGNOSIS — J9811 Atelectasis: Secondary | ICD-10-CM | POA: Diagnosis not present

## 2024-03-08 DIAGNOSIS — M549 Dorsalgia, unspecified: Secondary | ICD-10-CM | POA: Diagnosis not present

## 2024-03-08 DIAGNOSIS — M546 Pain in thoracic spine: Secondary | ICD-10-CM | POA: Diagnosis not present

## 2024-03-08 DIAGNOSIS — C3490 Malignant neoplasm of unspecified part of unspecified bronchus or lung: Secondary | ICD-10-CM | POA: Diagnosis not present

## 2024-03-08 DIAGNOSIS — C3492 Malignant neoplasm of unspecified part of left bronchus or lung: Secondary | ICD-10-CM

## 2024-03-08 DIAGNOSIS — R918 Other nonspecific abnormal finding of lung field: Secondary | ICD-10-CM | POA: Diagnosis not present

## 2024-03-08 DIAGNOSIS — I2510 Atherosclerotic heart disease of native coronary artery without angina pectoris: Secondary | ICD-10-CM | POA: Diagnosis not present

## 2024-03-08 LAB — BASIC METABOLIC PANEL WITH GFR
Anion gap: 15 (ref 5–15)
BUN: 9 mg/dL (ref 6–20)
CO2: 22 mmol/L (ref 22–32)
Calcium: 9.4 mg/dL (ref 8.9–10.3)
Chloride: 102 mmol/L (ref 98–111)
Creatinine, Ser: 0.67 mg/dL (ref 0.61–1.24)
GFR, Estimated: >60 mL/min (ref >60)
Glucose, Bld: 111 mg/dL — ABNORMAL HIGH (ref 70–99)
Potassium: 3.9 mmol/L (ref 3.5–5.1)
Sodium: 139 mmol/L (ref 135–145)

## 2024-03-08 LAB — CBC WITH DIFFERENTIAL/PLATELET
Abs Immature Granulocytes: 0.05 K/uL (ref 0.00–0.07)
Basophils Absolute: 0.1 K/uL (ref 0.0–0.1)
Basophils Relative: 1 %
Eosinophils Absolute: 0.1 K/uL (ref 0.0–0.5)
Eosinophils Relative: 1 %
HCT: 41.7 % (ref 39.0–52.0)
Hemoglobin: 13.7 g/dL (ref 13.0–17.0)
Immature Granulocytes: 1 %
Lymphocytes Relative: 12 %
Lymphs Abs: 1.2 K/uL (ref 0.7–4.0)
MCH: 28.6 pg (ref 26.0–34.0)
MCHC: 32.9 g/dL (ref 30.0–36.0)
MCV: 87.1 fL (ref 80.0–100.0)
Monocytes Absolute: 0.5 K/uL (ref 0.1–1.0)
Monocytes Relative: 5 %
Neutro Abs: 8.6 K/uL — ABNORMAL HIGH (ref 1.7–7.7)
Neutrophils Relative %: 80 %
Platelets: 392 K/uL (ref 150–400)
RBC: 4.79 MIL/uL (ref 4.22–5.81)
RDW: 12.7 % (ref 11.5–15.5)
WBC: 10.5 K/uL (ref 4.0–10.5)
nRBC: 0 % (ref 0.0–0.2)

## 2024-03-08 LAB — TROPONIN T, HIGH SENSITIVITY
Troponin T High Sensitivity: <15 ng/L (ref 0–19)
Troponin T High Sensitivity: <15 ng/L (ref 0–19)

## 2024-03-08 LAB — PRO BRAIN NATRIURETIC PEPTIDE: Pro Brain Natriuretic Peptide: 76.3 pg/mL (ref <300.0)

## 2024-03-08 LAB — PROTIME-INR
INR: 1.1 (ref 0.8–1.2)
Prothrombin Time: 14.4 s (ref 11.4–15.2)

## 2024-03-08 MED ORDER — SODIUM CHLORIDE 0.9 % IV BOLUS
500.0000 mL | Freq: Once | INTRAVENOUS | Status: AC
  Administered 2024-03-08: 500 mL via INTRAVENOUS

## 2024-03-08 MED ORDER — HYDROMORPHONE HCL 1 MG/ML IJ SOLN
1.0000 mg | Freq: Once | INTRAMUSCULAR | Status: AC
  Administered 2024-03-08: 1 mg via INTRAVENOUS
  Filled 2024-03-08: qty 1

## 2024-03-08 MED ORDER — ONDANSETRON HCL 4 MG/2ML IJ SOLN
4.0000 mg | Freq: Once | INTRAMUSCULAR | Status: AC
  Administered 2024-03-08: 4 mg via INTRAVENOUS
  Filled 2024-03-08: qty 2

## 2024-03-08 MED ORDER — IOHEXOL 350 MG/ML SOLN
75.0000 mL | Freq: Once | INTRAVENOUS | Status: AC | PRN
  Administered 2024-03-08: 75 mL via INTRAVENOUS

## 2024-03-08 MED ORDER — SODIUM CHLORIDE 0.9 % IV BOLUS
1000.0000 mL | Freq: Once | INTRAVENOUS | Status: AC
  Administered 2024-03-08: 1000 mL via INTRAVENOUS
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
## 2024-03-08 NOTE — Discharge Instructions (Signed)
 Take your pain medication as scheduled.  Included below is some things to try for constipation.  Follow-up with your oncology team.  Keep well-hydrated.  Return if any worsening or concerning symptoms.  For constipation we also recommend a diet high in fiber (beans, fruits, vegetables, whole grains). Take Colace 100-200 mg up to three times per day. You may take along with Senokot 1-2 tabs, ingest with full glass of water.  You may also take MiraLAX  1-2 capfuls 1-2 times a day until stools become soft and then slowly decrease the amount of MiraLAX  used.  Maintain fluid intake 6-8 glasses per day. Please increase fibers in your diet. You may also take Milk of Magnesia 30 mL as needed for constipation, you may repeat in 2 hours again if no bowl movement.

## 2024-03-08 NOTE — ED Provider Notes (Signed)
 [place] EMERGENCY DEPARTMENT AT [facility] Provider Note   CSN: [id number] Arrival date & time: 03/08/24  1017     Patient presents with: Shortness of Breath and Back Pain   Jeremy Hancock is a 53 y.o. male.  He is brought in by ambulance from home for worsening shortness of breath worsening left upper back pain shoulder pain.  He was diagnosed with metastatic lung cancer about 3 weeks ago.  Has seen oncology and is on oxycodone .  He said his pain and shortness of breath got worse since yesterday.  Nonproductive cough no hemoptysis no fever.  Nausea and lack of appetite.  He said he was afraid to take his pain medicine, unclear reason.  Was supposed to start chemo today.   The history is provided by the patient and the EMS personnel.  Shortness of Breath Severity:  Moderate Onset quality:  Gradual Duration:  2 days Timing:  Constant Progression:  Unchanged Chronicity:  New Relieved by:  Nothing Worsened by:  Movement and activity Associated symptoms: cough   Associated symptoms: no abdominal pain, no chest pain, no fever, no hemoptysis and no sputum production   Back Pain Associated symptoms: no abdominal pain, no chest pain and no fever        Prior to Admission medications   Medication Sig Start Date End Date Taking? Authorizing Provider  dexamethasone  (DECADRON ) 4 MG tablet Take 1 tab 2 times daily starting day before pemetrexed . Then take 2 tabs daily x 3 days starting day after carboplatin . Take with food. 03/05/24   [name], MD  Evolocumab  (REPATHA  SURECLICK) 140 MG/ML SOAJ ADMINISTER 1 ML UNDER THE SKIN EVERY 14 DAYS 01/20/24   Court Dorn PARAS, MD  folic acid  (FOLVITE ) 1 MG tablet Take 1 tablet (1 mg total) by mouth daily. Start 7 days before pemetrexed  chemotherapy. Continue until 21 days after pemetrexed  completed. 03/05/24   Kandala, Hyndavi, MD  ibuprofen  (ADVIL ) 800 MG tablet Take 800 mg by mouth every 8 (eight) hours as needed for moderate  pain. Patient not taking: Reported on 03/03/2024    [provider]  Lactulose  20 GM/30ML SOLN Take 30 mL at bedtime every night to assist with regular bowel movements.  Titrate down if you are having multiple bowel movements.  If a bowel movement has not occurred in 3-4 days, take 30 mL every 3 hours until a bowel movement has occurred. 03/03/24   Kandala, Hyndavi, MD  lidocaine -prilocaine  (EMLA ) cream Apply 1 Application topically as needed. 03/03/24   Kandala, Hyndavi, MD  lidocaine -prilocaine  (EMLA ) cream Apply to affected area once 03/05/24   Kandala, Hyndavi, MD  meloxicam  (MOBIC ) 15 MG tablet Take 1 tablet (15 mg total) by mouth daily. Patient not taking: Reported on 03/03/2024 02/03/24   Duanne Victoria Euceda DASEN, MD  Multiple Vitamin (MULTIVITAMIN) tablet Take 1 tablet by mouth daily.    [provider]  ondansetron  (ZOFRAN ) 8 MG tablet Take 1 tablet (8 mg total) by mouth every 8 (eight) hours as needed for nausea or vomiting. Start on the third day after carboplatin . 03/05/24   [name], MD  oxyCODONE  (OXY IR/ROXICODONE ) 5 MG immediate release tablet Take 2 tablets (10 mg total) by mouth every 6 (six) hours as needed for severe pain (pain score 7-10). 03/03/24   Kandala, Hyndavi, MD  prochlorperazine  (COMPAZINE ) 10 MG tablet Take 1 tablet (10 mg total) by mouth every 6 (six) hours as needed for nausea or vomiting. 03/05/24   [name], MD  traMADol  (ULTRAM ) 50 MG tablet Take 1 tablet (50 mg total) by mouth every 6 (six) hours as needed. 02/12/24 03/13/24  Duanne Delio Slates DASEN, MD    Allergies: Amoxicillin , Codeine, Crestor [rosuvastatin], Hydrocodone -acetaminophen , Lipitor [atorvastatin], Morphine and codeine, Statins, Zetia  [ezetimibe ], and Zyrtec  [cetirizine ]    Review of Systems  Constitutional:  Negative for fever.  Respiratory:  Positive for cough and shortness of breath. Negative for hemoptysis and sputum production.   Cardiovascular:  Negative for chest pain.   Gastrointestinal:  Positive for nausea. Negative for abdominal pain.  Musculoskeletal:  Positive for back pain.    Updated Vital Signs BP (!) 104/93 (BP Location: Left Arm)   Pulse 81   Temp 98 F (36.7 C) (Oral)   Resp 14   Ht 5' 5 (1.651 m)   Wt 63.5 kg   SpO2 93%   BMI 23.30 kg/m   Physical Exam Vitals and nursing note reviewed.  Constitutional:      General: He is not in acute distress.    Appearance: He is well-developed.  HENT:     Head: Normocephalic and atraumatic.  Eyes:     Conjunctiva/sclera: Conjunctivae normal.  Cardiovascular:     Rate and Rhythm: Normal rate and regular rhythm.     Heart sounds: No murmur heard. Pulmonary:     Effort: Pulmonary effort is normal. No respiratory distress.     Breath sounds: Normal breath sounds.  Abdominal:     Palpations: Abdomen is soft.     Tenderness: There is no abdominal tenderness.  Musculoskeletal:        General: No swelling.     Cervical back: Neck supple.     Right lower leg: No edema.     Left lower leg: No edema.  Skin:    General: Skin is warm and dry.     Capillary Refill: Capillary refill takes less than 2 seconds.  Neurological:     General: No focal deficit present.     Mental Status: He is alert.     (all labs ordered are listed, but only abnormal results are displayed) Labs Reviewed  BASIC METABOLIC PANEL WITH GFR - Abnormal; Notable for the following components:      Result Value   Glucose, Bld 111 (*)    All other components within normal limits  CBC WITH DIFFERENTIAL/PLATELET - Abnormal; Notable for the following components:   Neutro Abs 8.6 (*)    All other components within normal limits  PRO BRAIN NATRIURETIC PEPTIDE  PROTIME-INR  TROPONIN T, HIGH SENSITIVITY  TROPONIN T, HIGH SENSITIVITY    EKG: EKG Interpretation Date/Time:  Monday March 08 2024 10:46:08 EST Ventricular Rate:  69 PR Interval:  146 QRS Duration:  90 QT Interval:  406 QTC Calculation: 435 R  Axis:   -62  Text Interpretation: Sinus rhythm Left anterior fascicular block Abnormal R-wave progression, late transition Borderline T abnormalities, diffuse leads No significant change since prior 11/25 Confirmed by Towana Sharper [phone]) on 03/08/2024 10:50:17 AM  Radiology: CT Angio Chest PE W/Cm &/Or Wo Cm Result Date: 03/08/2024 CLINICAL DATA:  Pulmonary embolism (PE) suspected, high prob Shortness of breath and upper back pain. Recent lung cancer diagnosis. EXAM: CT ANGIOGRAPHY CHEST WITH CONTRAST TECHNIQUE: Multidetector CT imaging of the chest was performed using the standard protocol during bolus administration of intravenous contrast. Multiplanar CT image reconstructions and MIPs were obtained to evaluate the vascular anatomy. RADIATION DOSE REDUCTION: This exam was performed according to the departmental dose-optimization program which includes  automated exposure control, adjustment of the mA and/or kV according to patient size and/or use of iterative reconstruction technique. CONTRAST:  75mL OMNIPAQUE  IOHEXOL  350 MG/ML SOLN COMPARISON:  Radiographs 03/08/2024, PET-CT 02/22/2024 and chest CT 02/14/2024. FINDINGS: Cardiovascular: The pulmonary arteries are well opacified with contrast to the level of the segmental branches. There is no evidence of acute pulmonary embolism. No acute systemic arterial abnormalities are identified. There is coronary artery atherosclerosis. The heart size is normal. There is no pericardial effusion. Mediastinum/Nodes: 1.7 cm posterior AP window node on image 49/5 corresponds with hypermetabolic adenopathy on PET-CT. No other enlarged mediastinal, hilar or axillary lymph nodes identified. The thyroid gland, trachea and esophagus demonstrate no significant findings. Lungs/Pleura: No pleural effusion or pneumothorax. There is increased dependent atelectasis in both lungs. Similar appearance of known left upper lobe mass which was hypermetabolic on PET-CT, measuring  approximately 5.2 x 3.9 cm on image 36/5. This abuts the superior mediastinum and anterior chest wall. Upper abdomen: The visualized upper abdomen appears stable, without acute findings. Musculoskeletal/Chest wall: No acute osseous findings demonstrated. Known left scapular metastasis is partially imaged, grossly stable. Grossly stable Schmorl's node or metastasis within the superior endplate of T4. Review of the MIP images confirms the above findings. IMPRESSION: 1. No evidence of acute pulmonary embolism or other acute vascular findings in the chest. 2. Similar appearance of known left upper lobe mass and AP window adenopathy consistent with metastatic lung cancer. 3. Grossly stable osseous metastases. 4. Increased dependent atelectasis in both lungs. Electronically Signed   By: Elsie Perone M.D.   On: 03/08/2024 14:51   DG Chest Port 1 View Result Date: 03/08/2024 EXAM: 1 VIEW(S) XRAY OF THE CHEST 03/08/2024 11:06:20 AM COMPARISON: CT 02/14/2024. CLINICAL HISTORY: sob FINDINGS: LUNGS AND PLEURA: Medial left upper lobe mass again noted measuring 5.5 cm unchanged from comparison to CT. No pulmonary edema or pneumonia. No pleural effusion. No pneumothorax. HEART AND MEDIASTINUM: Normal trachea. Normal cardiac silhouette. BONES AND SOFT TISSUES: Large aggressive lesion along the lateral border of the left scapula. IMPRESSION: 1. Medial left upper lobe mass measuring 5.5 cm, unchanged from prior CT on 02/14/2024. 2. Large aggressive lesion along the lateral border of the left scapula. 3. No significant change from comparison CT. Electronically signed by: Norleen Boxer MD 03/08/2024 12:16 PM EST RP Workstation: [id number]     Procedures   Medications Ordered in the ED  HYDROmorphone  (DILAUDID ) injection 1 mg (1 mg Intravenous Given 03/08/24 1110)  sodium chloride  0.9 % bolus 500 mL (0 mLs Intravenous Stopped 03/08/24 1229)  ondansetron  (ZOFRAN ) injection 4 mg (4 mg Intravenous Given 03/08/24 1110)   HYDROmorphone  (DILAUDID ) injection 1 mg (1 mg Intravenous Given 03/08/24 1240)  iohexol  (OMNIPAQUE ) 350 MG/ML injection 75 mL (75 mLs Intravenous Contrast Given 03/08/24 1343)  sodium chloride  0.9 % bolus 1,000 mL (0 mLs Intravenous Stopped 03/08/24 1534)    Clinical Course as of 03/08/24 1717  Mon Mar 08, 2024  1053 I reached out to the cancer center PA Pennington and updated her that the patient was in the department.  Looks like he was supposed to start her treatment today and also go to [facility] for question port [MB]  1123 Chest x-ray interpreted by me as left upper arm mass otherwise no clear infiltrate. [MB]  1455 CTs do not show any acute findings.  I reviewed this with him and his family members.  He is comfortable plan for discharge.  We discussed taking his pain medicine and staying on  top of the pain instead of trying to catch up.  We also discussed bowel management with constipation from the pain medicine.  Oncology updated that he is going home and will need outpatient follow-up. [MB]    Clinical Course User Index [MB] Towana Ozell BROCKS, MD                                 Medical Decision Making Amount and/or Complexity of Data Reviewed Labs: ordered. Radiology: ordered.  Risk Prescription drug management.   This patient complains of worsening pain in his upper back, nausea, decreased appetite; this involves an extensive number of treatment Options and is a complaint that carries with it a high risk of complications and morbidity. The differential includes metastatic disease, pneumonia, PE, dehydration  I ordered, reviewed and interpreted labs, which included CBC normal chemistries normal troponins flat I ordered medication IV fluids IV pain medication nausea medication and reviewed PMP when indicated. I ordered imaging studies which included chest x-ray, CT angio chest and I independently    visualized and interpreted imaging which showed no acute PE or infiltrate.  Does  redemonstrate primary lung cancer with metastases Additional history obtained from patient's family member Previous records obtained and reviewed in epic including recent oncology notes I consulted oncology PA Pennington and discussed lab and imaging findings and discussed disposition.  Cardiac monitoring reviewed, sinus rhythm Social determinants considered, tobacco use Critical Interventions: None  After the interventions stated above, I reevaluated the patient and found patient's pain to be improved and blood pressure soft but awake and alert Admission and further testing considered, at this point he feels he can go home.  Recommended taking his pain medication more on schedule and closely following up with his oncology team.  Return instructions discussed.      Final diagnoses:  Primary malignant neoplasm of lung metastatic to other site, unspecified laterality (HCC)  Upper back pain on left side    ED Discharge Orders     None          Towana Ozell BROCKS, MD 03/08/24 1719

## 2024-03-08 NOTE — Telephone Encounter (Signed)
LVM to schedule consult with Dr. Squire ?

## 2024-03-09 ENCOUNTER — Inpatient Hospital Stay

## 2024-03-09 ENCOUNTER — Encounter: Payer: Self-pay | Admitting: Oncology

## 2024-03-09 DIAGNOSIS — C3492 Malignant neoplasm of unspecified part of left bronchus or lung: Secondary | ICD-10-CM

## 2024-03-09 DIAGNOSIS — Z5111 Encounter for antineoplastic chemotherapy: Secondary | ICD-10-CM | POA: Diagnosis not present

## 2024-03-09 MED ORDER — CYANOCOBALAMIN 1000 MCG/ML IJ SOLN
1000.0000 ug | Freq: Once | INTRAMUSCULAR | Status: AC
  Administered 2024-03-09: 1000 ug via INTRAMUSCULAR
  Filled 2024-03-09: qty 1

## 2024-03-09 NOTE — Patient Instructions (Signed)
 [facility] [place] - A DEPT OF [facility]  Discharge Instructions: Thank you for choosing [facility] to provide your oncology and hematology care.  If you have a lab appointment with the Cancer Center - please note that after April 8th, 2024, all labs will be drawn in the cancer center.  You do not have to check in or register with the main entrance as you have in the past but will complete your check-in in the cancer center.  Wear comfortable clothing and clothing appropriate for easy access to any Portacath or PICC line.   We strive to give you quality time with your provider. You may need to reschedule your appointment if you arrive late (15 or more minutes).  Arriving late affects you and other patients whose appointments are after yours.  Also, if you miss three or more appointments without notifying the office, you may be dismissed from the clinic at the provider's discretion.      For prescription refill requests, have your pharmacy contact our office and allow 72 hours for refills to be completed.    Today you received the following B12 return as scheduled.   To help prevent nausea and vomiting after your treatment, we encourage you to take your nausea medication as directed.  BELOW ARE SYMPTOMS THAT SHOULD BE REPORTED IMMEDIATELY: *FEVER GREATER THAN 100.4 F (38 C) OR HIGHER *CHILLS OR SWEATING *NAUSEA AND VOMITING THAT IS NOT CONTROLLED WITH YOUR NAUSEA MEDICATION *UNUSUAL SHORTNESS OF BREATH *UNUSUAL BRUISING OR BLEEDING *URINARY PROBLEMS (pain or burning when urinating, or frequent urination) *BOWEL PROBLEMS (unusual diarrhea, constipation, pain near the anus) TENDERNESS IN MOUTH AND THROAT WITH OR WITHOUT PRESENCE OF ULCERS (sore throat, sores in mouth, or a toothache) UNUSUAL RASH, SWELLING OR PAIN  UNUSUAL VAGINAL DISCHARGE OR ITCHING   Items with * indicate a potential emergency and should be followed up as soon as possible or go to  the Emergency Department if any problems should occur.  Please show the CHEMOTHERAPY ALERT CARD or IMMUNOTHERAPY ALERT CARD at check-in to the Emergency Department and triage nurse.  Should you have questions after your visit or need to cancel or reschedule your appointment, please contact [facility] [place] - A DEPT OF [facility] [phone]  and follow the prompts.  Office hours are 8:00 a.m. to 4:30 p.m. Monday - Friday. Please note that voicemails left after 4:00 p.m. may not be returned until the following business day.  We are closed weekends and major holidays. You have access to a nurse at all times for urgent questions. Please call the main number to the clinic [phone] and follow the prompts.  For any non-urgent questions, you may also contact your provider using MyChart. We now offer e-Visits for anyone 33 and older to request care online for non-urgent symptoms. For details visit [email or web address].   Also download the MyChart app! Go to the app store, search "MyChart", open the app, select [facility], and log in with your MyChart username and password.

## 2024-03-09 NOTE — Progress Notes (Signed)
 Patient tolerated injection with no complaints voiced. Site clean and dry with no bruising or swelling noted at site. See MAR for details. Band aid applied.  Patient stable during and after injection. VSS with discharge and left in satisfactory condition with no s/s of distress noted.

## 2024-03-09 NOTE — Progress Notes (Signed)

## 2024-03-09 NOTE — Progress Notes (Signed)
 Pharmacist Chemotherapy Monitoring - Initial Assessment    Anticipated start date: 03/10/24   The following has been reviewed per standard work regarding the patient's treatment regimen: The patient's diagnosis, treatment plan and drug doses, and organ/hematologic function Lab orders and baseline tests specific to treatment regimen  The treatment plan start date, drug sequencing, and pre-medications Prior authorization status  Patient's documented medication list, including drug-drug interaction screen and prescriptions for anti-emetics and supportive care specific to the treatment regimen The drug concentrations, fluid compatibility, administration routes, and timing of the medications to be used The patient's access for treatment and lifetime cumulative dose history, if applicable  The patient's medication allergies and previous infusion related reactions, if applicable   Changes made to treatment plan:  treatment plan date  Was in ED 03/08/24 for worsening cough.  Will proceed with treatement on 03/10/24 B-12 to be given 03/09/24 with education appt. RX's at home. Port to be rescheduled due to ED visit.  Follow up needed:  N/A   Jeremy Hancock, [facility], 03/09/2024  11:21 AM

## 2024-03-09 NOTE — Progress Notes (Signed)
 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 States he has a tender mass on right gluteal area. The pain gets worse with movement.  Past/Anticipated chemotherapy by medical oncology, if any:  03/03/2024 Davonna, MD   02/22/2024 PET Scan   Patient's main complaints related to symptomatic tumor(s) are:    Pain on a scale of 0-10 is:  7 out of 10 pain    If Spine Met(s), symptoms, if any, include: Bowel/Bladder retention or incontinence (please describe): Bowel retention.  Numbness or weakness in extremities (please describe): None Current Decadron  regimen, if applicable: Yes   Ambulatory status? Walker? Wheelchair?: None  SAFETY ISSUES: Prior radiation? None Pacemaker/ICD? None Possible current pregnancy? None Is the patient on methotrexate? None  Additional Complaints / other details:  None

## 2024-03-10 ENCOUNTER — Inpatient Hospital Stay

## 2024-03-10 VITALS — BP 109/68 | HR 69 | Temp 97.0°F | Resp 18 | Wt 139.6 lb

## 2024-03-10 DIAGNOSIS — Z5111 Encounter for antineoplastic chemotherapy: Secondary | ICD-10-CM | POA: Diagnosis not present

## 2024-03-10 DIAGNOSIS — C3492 Malignant neoplasm of unspecified part of left bronchus or lung: Secondary | ICD-10-CM

## 2024-03-10 LAB — COMPREHENSIVE METABOLIC PANEL WITH GFR
ALT: 30 U/L (ref 0–44)
AST: 27 U/L (ref 15–41)
Albumin: 4 g/dL (ref 3.5–5.0)
Alkaline Phosphatase: 160 U/L — ABNORMAL HIGH (ref 38–126)
Anion gap: 15 (ref 5–15)
BUN: 12 mg/dL (ref 6–20)
CO2: 22 mmol/L (ref 22–32)
Calcium: 9.9 mg/dL (ref 8.9–10.3)
Chloride: 102 mmol/L (ref 98–111)
Creatinine, Ser: 0.72 mg/dL (ref 0.61–1.24)
GFR, Estimated: >60 mL/min (ref >60)
Glucose, Bld: 112 mg/dL — ABNORMAL HIGH (ref 70–99)
Potassium: 4.3 mmol/L (ref 3.5–5.1)
Sodium: 139 mmol/L (ref 135–145)
Total Bilirubin: 0.2 mg/dL (ref 0.0–1.2)
Total Protein: 7.8 g/dL (ref 6.5–8.1)

## 2024-03-10 LAB — CBC WITH DIFFERENTIAL/PLATELET
Abs Immature Granulocytes: 0.04 K/uL (ref 0.00–0.07)
Basophils Absolute: 0 K/uL (ref 0.0–0.1)
Basophils Relative: 0 %
Eosinophils Absolute: 0 K/uL (ref 0.0–0.5)
Eosinophils Relative: 0 %
HCT: 42.5 % (ref 39.0–52.0)
Hemoglobin: 13.9 g/dL (ref 13.0–17.0)
Immature Granulocytes: 0 %
Lymphocytes Relative: 11 %
Lymphs Abs: 1.1 K/uL (ref 0.7–4.0)
MCH: 28.4 pg (ref 26.0–34.0)
MCHC: 32.7 g/dL (ref 30.0–36.0)
MCV: 86.7 fL (ref 80.0–100.0)
Monocytes Absolute: 0.5 K/uL (ref 0.1–1.0)
Monocytes Relative: 5 %
Neutro Abs: 8.2 K/uL — ABNORMAL HIGH (ref 1.7–7.7)
Neutrophils Relative %: 84 %
Platelets: 439 K/uL — ABNORMAL HIGH (ref 150–400)
RBC: 4.9 MIL/uL (ref 4.22–5.81)
RDW: 12.6 % (ref 11.5–15.5)
WBC: 9.9 K/uL (ref 4.0–10.5)
nRBC: 0 % (ref 0.0–0.2)

## 2024-03-10 LAB — MAGNESIUM: Magnesium: 2.4 mg/dL (ref 1.7–2.4)

## 2024-03-10 MED ORDER — SODIUM CHLORIDE 0.9 % IV SOLN
150.0000 mg | Freq: Once | INTRAVENOUS | Status: AC
  Administered 2024-03-10: 150 mg via INTRAVENOUS
  Filled 2024-03-10: qty 150

## 2024-03-10 MED ORDER — SODIUM CHLORIDE 0.9 % IV SOLN: INTRAVENOUS | Status: DC

## 2024-03-10 MED ORDER — SODIUM CHLORIDE 0.9 % IV SOLN
615.5000 mg | Freq: Once | INTRAVENOUS | Status: AC
  Administered 2024-03-10: 620 mg via INTRAVENOUS
  Filled 2024-03-10: qty 62

## 2024-03-10 MED ORDER — DEXAMETHASONE SOD PHOSPHATE PF 10 MG/ML IJ SOLN
10.0000 mg | Freq: Once | INTRAMUSCULAR | Status: AC
  Administered 2024-03-10: 10 mg via INTRAVENOUS

## 2024-03-10 MED ORDER — PALONOSETRON HCL INJECTION 0.25 MG/5ML
0.2500 mg | Freq: Once | INTRAVENOUS | Status: AC
  Administered 2024-03-10: 0.25 mg via INTRAVENOUS
  Filled 2024-03-10: qty 5

## 2024-03-10 MED ORDER — SODIUM CHLORIDE 0.9 % IV SOLN
500.0000 mg/m2 | Freq: Once | INTRAVENOUS | Status: AC
  Administered 2024-03-10: 900 mg via INTRAVENOUS
  Filled 2024-03-10: qty 20

## 2024-03-10 NOTE — Progress Notes (Signed)
 SPIRITUAL CARE AND COUNSELING CONSULT NOTE   VISIT SUMMARY   Reason for Visit: Chaplain identified Pt on the schedule as a Pt I had not connected with yet and visited to deliver introduction to Spiritual Care and create a supportive contact  Description of Visit: Upon arrival I found [name] seated in the recliner receiving treatment, and there was no support person present.  Pt was intently watching something on his phone while talking to me.  Eventually he showed me the phone and revealed that he was watching cameras in his own home, keeping an eye on his dad.  His father has dementia and Camellia is his sole caregiver.  Pt states that this is a significant source of anxiety and stress for him.    Pt also discussed his feelings of isolation and loneliness in his situation- states that he wishes there was somebody he could talk with from time to time.  At this point I delivered my normal chaplain education explaining that a big part of my supportive role involves creating space for him to talk and be heard.  This was encouraging to Pt.   Pt is agreeable to be providing support.  Plan of Care: I will continue to follow up with Camellia on a bi-weekly basis for the time being.  SPIRITUAL ENCOUNTER                                                                                                                                                                      Type of Visit: Initial Care provided to:: Patient Referral source: Chaplain assessment Reason for visit:  (Introduction to Spiritual Care) OnCall Visit: No   SPIRITUAL FRAMEWORK  Presenting Themes: Meaning/purpose/sources of inspiration, Goals in life/care, Values and beliefs, Significant life change, Caregiving needs, Coping tools, Impactful experiences and emotions, Courage hope and growth Values/beliefs: Camellia [employer] faith and believes that God is in control of his life and health. 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  He also is dealing with a great deal of pain in his body Patient Stress Factors: Exhausted, Health changes, Major life changes Family Stress Factors: Lack of caregivers, Loss of control   GOALS   Self/Personal Goals: Hoping to survive beyond 6 months Clinical Care Goals: Build relationship of Care and Support.  Camellia needs a microbiologist.   INTERVENTIONS   Spiritual Care Interventions Made: Established relationship of care and support, Compassionate presence, Reflective listening, Narrative/life review, Explored values/beliefs/practices/strengths (Asked guide questions about faith)    INTERVENTION OUTCOMES   Outcomes: Connection to spiritual care, Awareness of support, Reduced isolation  SPIRITUAL CARE PLAN   Spiritual Care Issues Still Outstanding: Chaplain will continue to  follow   Maude Roll, MDiv Chaplain, [facility] [email or web address] [phone] 03/10/2024 11:18 AM

## 2024-03-10 NOTE — Progress Notes (Signed)
 Patient presents today for C1D1 Alimta, Carboplatin  peripheral. Vital signs stable. Patient given literature on pre-medications. Teaching packet at the bedside.   Treatment given today per MD orders. Tolerated infusion without adverse affects. Vital signs stable. No complaints at this time. Discharged from clinic ambulatory in stable condition. Alert and oriented x 3. F/U with [facility] as scheduled.

## 2024-03-10 NOTE — Patient Instructions (Signed)
 [facility] [place] - A DEPT OF [facility]  Discharge Instructions: Thank you for choosing [facility] to provide your oncology and hematology care.  If you have a lab appointment with the Cancer Center - please note that after April 8th, 2024, all labs will be drawn in the cancer center.  You do not have to check in or register with the main entrance as you have in the past but will complete your check-in in the cancer center.  Wear comfortable clothing and clothing appropriate for easy access to any Portacath or PICC line.   We strive to give you quality time with your provider. You may need to reschedule your appointment if you arrive late (15 or more minutes).  Arriving late affects you and other patients whose appointments are after yours.  Also, if you miss three or more appointments without notifying the office, you may be dismissed from the clinic at the providers discretion.      For prescription refill requests, have your pharmacy contact our office and allow 72 hours for refills to be completed.    Today you received the following chemotherapy and/or immunotherapy agents Alimta, Carboplatin .  Carboplatin  Injection What is this medication? CARBOPLATIN  (KAR boe pla tin) treats some types of cancer. It works by slowing down the growth of cancer cells. This medicine may be used for other purposes; ask your health care provider or pharmacist if you have questions. COMMON BRAND NAME(S): Paraplatin  What should I tell my care team before I take this medication? They need to know if you have any of these conditions: Blood disorders Hearing problems Kidney disease Recent or ongoing radiation therapy An unusual or allergic reaction to carboplatin , cisplatin, other medications, foods, dyes, or preservatives Pregnant or trying to get pregnant Breast-feeding How should I use this medication? This medication is injected into a vein. It is given by your  care team in a hospital or clinic setting. Talk to your care team about the use of this medication in children. Special care may be needed. Overdosage: If you think you have taken too much of this medicine contact a poison control center or emergency room at once. NOTE: This medicine is only for you. Do not share this medicine with others. What if I miss a dose? Keep appointments for follow-up doses. It is important not to miss your dose. Call your care team if you are unable to keep an appointment. What may interact with this medication? Medications for seizures Some antibiotics, such as amikacin, gentamicin , neomycin, streptomycin, tobramycin Vaccines This list may not describe all possible interactions. Give your health care provider a list of all the medicines, herbs, non-prescription drugs, or dietary supplements you use. Also tell them if you smoke, drink alcohol, or use illegal drugs. Some items may interact with your medicine. What should I watch for while using this medication? Your condition will be monitored carefully while you are receiving this medication. You may need blood work while taking this medication. This medication may make you feel generally unwell. This is not uncommon, as chemotherapy can affect healthy cells as well as cancer cells. Report any side effects. Continue your course of treatment even though you feel ill unless your care team tells you to stop. In some cases, you may be given additional medications to help with side effects. Follow all directions for their use. This medication may increase your risk of getting an infection. Call your care team for advice  if you get a fever, chills, sore throat, or other symptoms of a cold or flu. Do not treat yourself. Try to avoid being around people who are sick. Avoid taking medications that contain aspirin , acetaminophen , ibuprofen , naproxen , or ketoprofen unless instructed by your care team. These medications may hide a  fever. Be careful brushing or flossing your teeth or using a toothpick because you may get an infection or bleed more easily. If you have any dental work done, tell your dentist you are receiving this medication. Talk to your care team if you wish to become pregnant or think you might be pregnant. This medication can cause serious birth defects. Talk to your care team about effective forms of contraception. Do not breast-feed while taking this medication. What side effects may I notice from receiving this medication? Side effects that you should report to your care team as soon as possible: Allergic reactions--skin rash, itching, hives, swelling of the face, lips, tongue, or throat Infection--fever, chills, cough, sore throat, wounds that don't heal, pain or trouble when passing urine, general feeling of discomfort or being unwell Low red blood cell level--unusual weakness or fatigue, dizziness, headache, trouble breathing Pain, tingling, or numbness in the hands or feet, muscle weakness, change in vision, confusion or trouble speaking, loss of balance or coordination, trouble walking, seizures Unusual bruising or bleeding Side effects that usually do not require medical attention (report to your care team if they continue or are bothersome): Hair loss Nausea Unusual weakness or fatigue Vomiting This list may not describe all possible side effects. Call your doctor for medical advice about side effects. You may report side effects to FDA at 1-800-FDA-1088. Where should I keep my medication? This medication is given in a hospital or clinic. It will not be stored at home. NOTE: This sheet is a summary. It may not cover all possible information. If you have questions about this medicine, talk to your doctor, pharmacist, or health care provider.  2024 Elsevier/Gold Standard (2021-07-10 00:00:00)Pemetrexed  Injection What is this medication? PEMETREXED  (PEM e TREX ed) treats some types of cancer. It  works by slowing down the growth of cancer cells. This medicine may be used for other purposes; ask your health care provider or pharmacist if you have questions. COMMON BRAND NAME(S): Alimta, PEMFEXY, PEMRYDI RTU What should I tell my care team before I take this medication? They need to know if you have any of these conditions: Infection, such as chickenpox, cold sores, or herpes Kidney disease Low blood cell levels (white cells, red cells, and platelets) Lung or breathing disease, such as asthma Radiation therapy An unusual or allergic reaction to pemetrexed , other medications, foods, dyes, or preservatives If you or your partner are pregnant or trying to get pregnant Breast-feeding How should I use this medication? This medication is injected into a vein. It is given by your care team in a hospital or clinic setting. Talk to your care team about the use of this medication in children. Special care may be needed. Overdosage: If you think you have taken too much of this medicine contact a poison control center or emergency room at once. NOTE: This medicine is only for you. Do not share this medicine with others. What if I miss a dose? Keep appointments for follow-up doses. It is important not to miss your dose. Call your care team if you are unable to keep an appointment. What may interact with this medication? Do not take this medication with any of the following:  Live virus vaccines This medication may also interact with the following: Ibuprofen  This list may not describe all possible interactions. Give your health care provider a list of all the medicines, herbs, non-prescription drugs, or dietary supplements you use. Also tell them if you smoke, drink alcohol, or use illegal drugs. Some items may interact with your medicine. What should I watch for while using this medication? Your condition will be monitored carefully while you are receiving this medication. This medication may  make you feel generally unwell. This is not uncommon as chemotherapy can affect healthy cells as well as cancer cells. Report any side effects. Continue your course of treatment even though you feel ill unless your care team tells you to stop. This medication can cause serious side effects. To reduce the risk, your care team may give you other medications to take before receiving this one. Be sure to follow the directions from your care team. This medication can cause a rash or redness in areas of the body that have previously had radiation therapy. If you have had radiation therapy, tell your care team if you notice a rash in this area. This medication may increase your risk of getting an infection. Call your care team for advice if you get a fever, chills, sore throat, or other symptoms of a cold or flu. Do not treat yourself. Try to avoid being around people who are sick. Be careful brushing or flossing your teeth or using a toothpick because you may get an infection or bleed more easily. If you have any dental work done, tell your dentist you are receiving this medication. Avoid taking medications that contain aspirin , acetaminophen , ibuprofen , naproxen , or ketoprofen unless instructed by your care team. These medications may hide a fever. Check with your care team if you have severe diarrhea, nausea, and vomiting, or if you sweat a lot. The loss of too much body fluid may make it dangerous for you to take this medication. Talk to your care team if you or your partner wish to become pregnant or think either of you might be pregnant. This medication can cause serious birth defects if taken during pregnancy and for 6 months after the last dose. A negative pregnancy test is required before starting this medication. A reliable form of contraception is recommended while taking this medication and for 6 months after the last dose. Talk to your care team about reliable forms of contraception. Do not father a  child while taking this medication and for 3 months after the last dose. Use a condom while having sex during this time period. Do not breastfeed while taking this medication and for 1 week after the last dose. This medication may cause infertility. Talk to your care team if you are concerned about your fertility. What side effects may I notice from receiving this medication? Side effects that you should report to your care team as soon as possible: Allergic reactions--skin rash, itching, hives, swelling of the face, lips, tongue, or throat Dry cough, shortness of breath or trouble breathing Infection--fever, chills, cough, sore throat, wounds that don't heal, pain or trouble when passing urine, general feeling of discomfort or being unwell Kidney injury--decrease in the amount of urine, swelling of the ankles, hands, or feet Low red blood cell level--unusual weakness or fatigue, dizziness, headache, trouble breathing Redness, blistering, peeling, or loosening of the skin, including inside the mouth Unusual bruising or bleeding Side effects that usually do not require medical attention (report to your care team  if they continue or are bothersome): Fatigue Loss of appetite Nausea Vomiting This list may not describe all possible side effects. Call your doctor for medical advice about side effects. You may report side effects to FDA at 1-800-FDA-1088. Where should I keep my medication? This medication is given in a hospital or clinic. It will not be stored at home. NOTE: This sheet is a summary. It may not cover all possible information. If you have questions about this medicine, talk to your doctor, pharmacist, or health care provider.  2024 Elsevier/Gold Standard (2021-07-24 00:00:00)      To help prevent nausea and vomiting after your treatment, we encourage you to take your nausea medication as directed.  BELOW ARE SYMPTOMS THAT SHOULD BE REPORTED IMMEDIATELY: *FEVER GREATER THAN 100.4 F  (38 C) OR HIGHER *CHILLS OR SWEATING *NAUSEA AND VOMITING THAT IS NOT CONTROLLED WITH YOUR NAUSEA MEDICATION *UNUSUAL SHORTNESS OF BREATH *UNUSUAL BRUISING OR BLEEDING *URINARY PROBLEMS (pain or burning when urinating, or frequent urination) *BOWEL PROBLEMS (unusual diarrhea, constipation, pain near the anus) TENDERNESS IN MOUTH AND THROAT WITH OR WITHOUT PRESENCE OF ULCERS (sore throat, sores in mouth, or a toothache) UNUSUAL RASH, SWELLING OR PAIN  UNUSUAL VAGINAL DISCHARGE OR ITCHING   Items with * indicate a potential emergency and should be followed up as soon as possible or go to the Emergency Department if any problems should occur.  Please show the CHEMOTHERAPY ALERT CARD or IMMUNOTHERAPY ALERT CARD at check-in to the Emergency Department and triage nurse.  Should you have questions after your visit or need to cancel or reschedule your appointment, please contact [facility] [place] - A DEPT OF [facility] [phone]  and follow the prompts.  Office hours are 8:00 a.m. to 4:30 p.m. Monday - Friday. Please note that voicemails left after 4:00 p.m. may not be returned until the following business day.  We are closed weekends and major holidays. You have access to a nurse at all times for urgent questions. Please call the main number to the clinic [phone] and follow the prompts.  For any non-urgent questions, you may also contact your provider using MyChart. We now offer e-Visits for anyone 21 and older to request care online for non-urgent symptoms. For details visit [email or web address].   Also download the MyChart app! Go to the app store, search MyChart, open the app, select [facility], and log in with your MyChart username and password.

## 2024-03-11 ENCOUNTER — Telehealth: Payer: Self-pay

## 2024-03-11 NOTE — Telephone Encounter (Signed)
 Chemotherapy 24 hour call.  Patient reviewed medications.  No other complaints voiced.  Reviewed numbers and when to call with understanding verbalized.

## 2024-03-11 NOTE — Progress Notes (Signed)
 Patient called with questions about dexamethasone , folic acid , and lactulose . He wanted to make sure he was taking them correctly. Reiterated to patient education on medication he received in clinic and confirmed what the prescription stated. Patient verbalized understanding. Informed him to call with any other questions.

## 2024-03-12 ENCOUNTER — Other Ambulatory Visit: Payer: Self-pay | Admitting: Interventional Radiology

## 2024-03-12 NOTE — Progress Notes (Signed)
 Patient for IR Port Placement on Mon 03/15/24, I called and spoke with the patient on the phone and gave pre-procedure instructions. Pt was made aware to be here at 1p, NPO after MN prior to procedure as well as driver post procedure/recovery/discharge. Pt stated understanding.  Called   03/12/24

## 2024-03-13 DIAGNOSIS — C3490 Malignant neoplasm of unspecified part of unspecified bronchus or lung: Secondary | ICD-10-CM | POA: Diagnosis not present

## 2024-03-15 ENCOUNTER — Ambulatory Visit: Admission: RE | Admit: 2024-03-15 | Discharge: 2024-03-15 | Attending: Oncology | Admitting: Radiology

## 2024-03-15 ENCOUNTER — Other Ambulatory Visit: Payer: Self-pay

## 2024-03-15 ENCOUNTER — Encounter: Payer: Self-pay | Admitting: Radiology

## 2024-03-15 DIAGNOSIS — I2510 Atherosclerotic heart disease of native coronary artery without angina pectoris: Secondary | ICD-10-CM | POA: Diagnosis not present

## 2024-03-15 DIAGNOSIS — Z452 Encounter for adjustment and management of vascular access device: Secondary | ICD-10-CM | POA: Diagnosis not present

## 2024-03-15 DIAGNOSIS — M25552 Pain in left hip: Secondary | ICD-10-CM | POA: Diagnosis not present

## 2024-03-15 DIAGNOSIS — C3492 Malignant neoplasm of unspecified part of left bronchus or lung: Secondary | ICD-10-CM

## 2024-03-15 DIAGNOSIS — Z87891 Personal history of nicotine dependence: Secondary | ICD-10-CM | POA: Insufficient documentation

## 2024-03-15 DIAGNOSIS — C3412 Malignant neoplasm of upper lobe, left bronchus or lung: Secondary | ICD-10-CM | POA: Diagnosis not present

## 2024-03-15 HISTORY — PX: IR IMAGING GUIDED PORT INSERTION: IMG5740

## 2024-03-15 MED ORDER — LIDOCAINE-EPINEPHRINE 1 %-1:100000 IJ SOLN
20.0000 mL | Freq: Once | INTRAMUSCULAR | Status: AC
  Administered 2024-03-15: 15:00:00 15 mL

## 2024-03-15 MED ORDER — SODIUM CHLORIDE 0.9 % IV SOLN: INTRAVENOUS | Status: DC

## 2024-03-15 MED ORDER — MIDAZOLAM HCL 2 MG/2ML IJ SOLN
INTRAMUSCULAR | Status: AC
  Filled 2024-03-15: qty 4

## 2024-03-15 MED ORDER — MIDAZOLAM HCL (PF) 2 MG/2ML IJ SOLN
INTRAMUSCULAR | Status: AC | PRN
  Administered 2024-03-15: 15:00:00 2 mg via INTRAVENOUS

## 2024-03-15 MED ORDER — HEPARIN SOD (PORK) LOCK FLUSH 100 UNIT/ML IV SOLN
500.0000 [IU] | Freq: Once | INTRAVENOUS | Status: AC
  Administered 2024-03-15: 15:00:00 500 [IU] via INTRAVENOUS

## 2024-03-15 MED ORDER — LIDOCAINE-EPINEPHRINE 1 %-1:100000 IJ SOLN
INTRAMUSCULAR | Status: AC
  Filled 2024-03-15: qty 1

## 2024-03-15 MED ORDER — HEPARIN SOD (PORK) LOCK FLUSH 100 UNIT/ML IV SOLN
INTRAVENOUS | Status: AC
  Filled 2024-03-15: qty 5

## 2024-03-15 NOTE — Procedures (Signed)
 Interventional Radiology Procedure Note  Procedure: Placement of a RIGHT internal jugular Bard Clearvue power portacatheter.  Tip is positioned at the superior cavoatrial junction and catheter is ready for immediate use.   Complications: No immediate  Recommendations:  - Ok to shower tomorrow - Do not submerge for 7 days - Routine line care     Signed,  Wilkie LOIS Lent, MD

## 2024-03-15 NOTE — H&P (Signed)
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 with associated foraminal stenosis. SOFT TISSUES: No prevertebral soft tissue swelling. IMPRESSION: 1. No acute intracranial abnormality. 2. No acute fracture or traumatic malalignment of the cervical spine. Electronically signed by: Gilmore Molt MD 02/14/2024 11:43 PM EST RP Workstation: [id number]   CT CERVICAL SPINE WO CONTRAST Result Date: 02/14/2024 EXAM: CT HEAD AND CERVICAL SPINE 02/14/2024 11:25:46 PM TECHNIQUE: CT of the head and cervical spine was performed without the administration of intravenous contrast. Multiplanar reformatted images are provided for review. Automated exposure control, iterative reconstruction, and/or weight based adjustment of the mA/kV was utilized to reduce the radiation dose to as low as reasonably achievable. COMPARISON: None available. CLINICAL HISTORY: Brain metastases suspected FINDINGS: CT HEAD BRAIN AND VENTRICLES: No acute intracranial hemorrhage. No mass effect or midline shift. No abnormal extra-axial fluid collection. No evidence of acute infarct. No hydrocephalus. ORBITS: No acute abnormality. SINUSES AND MASTOIDS: No acute abnormality. SOFT  TISSUES AND SKULL: No acute skull fracture. No acute soft tissue abnormality. CT CERVICAL SPINE BONES AND ALIGNMENT: No acute fracture or traumatic malalignment. DEGENERATIVE CHANGES: Severe right C2-C3 facet arthropathy. C5-C6 facet and uncovertebral hypertrophy with associated foraminal stenosis. SOFT TISSUES: No prevertebral soft tissue swelling. IMPRESSION: 1. No acute intracranial abnormality. 2. No acute fracture or traumatic malalignment of the cervical spine. Electronically signed by: Gilmore Molt MD 02/14/2024 11:43 PM EST RP Workstation: [id number]    Labs:  CBC: Recent Labs    02/14/24 2211 02/17/24 0903 03/08/24 1059 03/10/24 0809  WBC 9.7 10.8* 10.5 9.9  HGB 13.9 15.8 13.7 13.9  HCT 42.6 48.9 41.7 42.5  PLT 364 387 392 439*    COAGS: Recent Labs    03/08/24 1059  INR 1.1    BMP: Recent Labs    02/14/24 2211 02/17/24 0903 03/08/24 1059 03/10/24 0809  NA 140 141 139 139  K  3.0* 4.2 3.9 4.3  CL 102 101 102 102  CO2 24 29 22 22   GLUCOSE 161* 92 111* 112*  BUN 6 8 9 12   CALCIUM 9.1 10.3 9.4 9.9  CREATININE 0.79 0.71 0.67 0.72  GFRNONAA >60 >60 >60 >60    LIVER FUNCTION TESTS: Recent Labs    01/07/24 0815 02/03/24 0906 02/14/24 2211 02/17/24 0903 03/10/24 0809  BILITOT 0.4 0.4 <0.2 0.4 0.2  AST 20 15 25 20 27   ALT 26 16 27 24 30   ALKPHOS 131*  --  118 137* 160*  PROT 6.9 6.7 6.7 8.0 7.8  ALBUMIN 4.3  --  3.9 4.4 4.0    TUMOR MARKERS: No results for input(s): AFPTM, CEA, CA199, CHROMGRNA in the last 8760 hours.  Assessment and Plan:  Metastatic Lung Cancer: Jeremy Hancock is a 53 y.o. male with a history of CAD and tobacco abuse recently found to have metastatic lung cancer who presents to [facility] department for an image-guided port-a-catheter insertion with Dr. VEAR Lent. Procedure to be performed under moderate sedation.  -NPO since ~10am -Patient is aware that he is not able to have moderate sedation  due to oral intake this morning -He is agreeable to move forward with one medication; Dr. Lent in agreement -Plan for port insertion with Dr. Lent with Versed  only   Risks and benefits of image guided port-a-catheter placement was discussed with the patient including, but not limited to bleeding, infection, pneumothorax, or fibrin sheath development and need for additional procedures.  All of the patient's questions were answered, patient is agreeable to proceed. Consent signed and in chart.   Thank you for allowing our service to participate in VAIDEN ADAMES 's care.    Electronically Signed: Alinah Sheard M Lillah Standre, PA-C   03/15/2024, 1:56 PM     I spent a total of  30 Minutes  in face to face in clinical consultation, greater than 50% of which was counseling/coordinating care for port

## 2024-03-15 NOTE — OR Nursing (Signed)
 Pt reported he forgot he was to be NPO. Dr Karalee notiifed, plan to give only one med versed  or fentanyl  and lidocaine  local. Pt agreeable

## 2024-03-15 NOTE — Progress Notes (Signed)
 Patient clinically stable post IR Port placement with Versed  2 mg IV per Dr Karalee, tolerated procedure well. Report given to Alyson Patterson RN post procedure/specials/20.

## 2024-03-16 ENCOUNTER — Ambulatory Visit
Admission: RE | Admit: 2024-03-16 | Discharge: 2024-03-16 | Disposition: A | Source: Ambulatory Visit | Attending: Radiation Oncology

## 2024-03-16 ENCOUNTER — Ambulatory Visit
Admission: RE | Admit: 2024-03-16 | Discharge: 2024-03-16 | Attending: Radiation Oncology | Admitting: Radiation Oncology

## 2024-03-16 VITALS — BP 99/70 | HR 69 | Temp 97.6°F | Resp 18 | Ht 65.0 in | Wt 135.2 lb

## 2024-03-16 DIAGNOSIS — C7951 Secondary malignant neoplasm of bone: Secondary | ICD-10-CM | POA: Diagnosis not present

## 2024-03-16 DIAGNOSIS — C3412 Malignant neoplasm of upper lobe, left bronchus or lung: Secondary | ICD-10-CM | POA: Diagnosis not present

## 2024-03-16 DIAGNOSIS — Z87891 Personal history of nicotine dependence: Secondary | ICD-10-CM | POA: Diagnosis not present

## 2024-03-16 NOTE — Progress Notes (Addendum)
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 higher frequency such as every hour. Emergency room evaluation advised if no relief after 24 hours due to potential rectal tumor obstruction. - Take Miralax  once an hour until bowel movement occurs, then reduce to 2-3 times daily for maintenance. - Consider mineral oil suppository if no relief after 24 hours of Miralax  use. - Seek emergency room evaluation if no bowel movement after 24 hours of treatment due to potential rectal tumor obstruction.   On date of service, in total, I spent 60 minutes on this encounter. Patient was seen in person.   __________________________________________   Lauraine Golden, MD  This document serves as a record of services personally performed by Lauraine Golden, MD. It was created on her behalf by Reymundo Cartwright, a trained medical scribe. The creation of this record is based on the scribe's personal observations and the provider's statements to them. This document has been checked and approved by the attending provider.

## 2024-03-17 ENCOUNTER — Other Ambulatory Visit (HOSPITAL_COMMUNITY)

## 2024-03-18 ENCOUNTER — Encounter (HOSPITAL_COMMUNITY): Payer: Self-pay

## 2024-03-22 ENCOUNTER — Telehealth: Payer: Self-pay | Admitting: Dietician

## 2024-03-22 ENCOUNTER — Inpatient Hospital Stay: Admitting: Dietician

## 2024-03-22 ENCOUNTER — Other Ambulatory Visit: Payer: Self-pay | Admitting: Oncology

## 2024-03-22 DIAGNOSIS — K5900 Constipation, unspecified: Secondary | ICD-10-CM

## 2024-03-22 NOTE — Telephone Encounter (Signed)
 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  Patient receiving carboplatin  + alimta  q21d. Pending start of palliative radiation to painful bone lesions. Patient under the care of Dr. Davonna  Past medical history includes HLD, MRSA infection, chronic osteomyelitis of right foot, bilateral hearing loss, lumbar radiculopathy, C diff, internal hemorrhoids  Spoke with patient via telephone. He reports significant pain in upper back and shoulder. He has to take scheduled pain medication that provides some relief. Patient reports recently moving in the mom and step-dad which has been good. He previously was taking care of his dad who has dementia. Patient no longer feeling chronic stress from care giving. Appetite is good. His mom prepares breakfast and supper meals. Patient snacks in the afternoon (gummies, ice cream, chips, vienna sausage). Today he had leftover steak, baked potato, honey bun, and some pringles for lunch. Drinking 2 premier proteins and lots of water.   Patient reports constipation has resolved. Taking dulcolax every other day which is working well. He denies nausea or vomiting.   Nutrition Focused Physical Exam: unable to complete (telephone visit)  Medications: folvite , lactulose , mobic , MVI, zofran , oxycodone , compazine , decadron    Labs: 12/10 labs reviewed    Anthropometrics:   Height: 5' 5 Weight: 135 lb 3.2 oz (12/16) UBW: 150 lb (January) BMI: 22.50  NUTRITION DIAGNOSIS: Unintended wt loss related to cancer as evidenced by 2.9% wt loss in 6 days (pt 139 lb 9.6 oz on 12/10) which is severe for time frame   INTERVENTION:  Educated on importance of adequate calorie/protein energy intake to maintain strength/weights  Encourage small frequent meals/snacks  Continue drinking 2 premier protein Continue bowel  regimen Support and encouragement    MONITORING, EVALUATION, GOAL: Pt will tolerate increased calories and protein to minimize further weight loss during treatment    Next Visit: Monday January 12 via telephone

## 2024-03-23 MED ORDER — REPATHA SURECLICK 140 MG/ML ~~LOC~~ SOAJ: 140.0000 mg | SUBCUTANEOUS | 1 refills | Status: DC

## 2024-03-23 NOTE — Telephone Encounter (Signed)
 Pt c/o medication issue:  1. Name of Medication:   Evolocumab  (REPATHA  SURECLICK) 140 MG/ML SOAJ   2. How are you currently taking this medication (dosage and times per day)?   3. Are you having a reaction (difficulty breathing--STAT)?   4. What is your medication issue?    Patient stated he took his last injection on Friday, 12/19 and will need the refill sent to his mother's address:  [address] [place], [place] [address]

## 2024-03-23 NOTE — Addendum Note (Signed)
 Addended by: DARRELL BRUCKNER on: 03/23/2024 03:01 PM   Modules accepted: Orders

## 2024-03-24 ENCOUNTER — Ambulatory Visit
Admission: RE | Admit: 2024-03-24 | Discharge: 2024-03-24 | Disposition: A | Source: Ambulatory Visit | Attending: Radiation Oncology | Admitting: Radiation Oncology

## 2024-03-24 ENCOUNTER — Other Ambulatory Visit: Payer: Self-pay | Admitting: Oncology

## 2024-03-24 DIAGNOSIS — C3412 Malignant neoplasm of upper lobe, left bronchus or lung: Secondary | ICD-10-CM | POA: Insufficient documentation

## 2024-03-24 DIAGNOSIS — Z87891 Personal history of nicotine dependence: Secondary | ICD-10-CM | POA: Diagnosis not present

## 2024-03-24 DIAGNOSIS — C3492 Malignant neoplasm of unspecified part of left bronchus or lung: Secondary | ICD-10-CM

## 2024-03-24 DIAGNOSIS — C7951 Secondary malignant neoplasm of bone: Secondary | ICD-10-CM | POA: Diagnosis present

## 2024-03-24 DIAGNOSIS — G893 Neoplasm related pain (acute) (chronic): Secondary | ICD-10-CM | POA: Diagnosis not present

## 2024-03-29 ENCOUNTER — Inpatient Hospital Stay (HOSPITAL_BASED_OUTPATIENT_CLINIC_OR_DEPARTMENT_OTHER): Admitting: Oncology

## 2024-03-29 ENCOUNTER — Other Ambulatory Visit (HOSPITAL_COMMUNITY): Payer: Self-pay

## 2024-03-29 ENCOUNTER — Other Ambulatory Visit: Payer: Self-pay

## 2024-03-29 ENCOUNTER — Other Ambulatory Visit (HOSPITAL_BASED_OUTPATIENT_CLINIC_OR_DEPARTMENT_OTHER): Payer: Self-pay

## 2024-03-29 ENCOUNTER — Encounter: Payer: Self-pay | Admitting: *Deleted

## 2024-03-29 ENCOUNTER — Inpatient Hospital Stay

## 2024-03-29 ENCOUNTER — Telehealth: Payer: Self-pay | Admitting: Cardiovascular Disease

## 2024-03-29 DIAGNOSIS — K5900 Constipation, unspecified: Secondary | ICD-10-CM | POA: Diagnosis not present

## 2024-03-29 DIAGNOSIS — R931 Abnormal findings on diagnostic imaging of heart and coronary circulation: Secondary | ICD-10-CM

## 2024-03-29 DIAGNOSIS — G893 Neoplasm related pain (acute) (chronic): Secondary | ICD-10-CM

## 2024-03-29 DIAGNOSIS — F17210 Nicotine dependence, cigarettes, uncomplicated: Secondary | ICD-10-CM | POA: Diagnosis not present

## 2024-03-29 DIAGNOSIS — E782 Mixed hyperlipidemia: Secondary | ICD-10-CM

## 2024-03-29 DIAGNOSIS — C3492 Malignant neoplasm of unspecified part of left bronchus or lung: Secondary | ICD-10-CM

## 2024-03-29 DIAGNOSIS — K629 Disease of anus and rectum, unspecified: Secondary | ICD-10-CM

## 2024-03-29 DIAGNOSIS — Z8249 Family history of ischemic heart disease and other diseases of the circulatory system: Secondary | ICD-10-CM

## 2024-03-29 DIAGNOSIS — Z5111 Encounter for antineoplastic chemotherapy: Secondary | ICD-10-CM | POA: Diagnosis not present

## 2024-03-29 LAB — CBC WITH DIFFERENTIAL/PLATELET
Abs Immature Granulocytes: 0.01 K/uL (ref 0.00–0.07)
Basophils Absolute: 0 K/uL (ref 0.0–0.1)
Basophils Relative: 0 %
Eosinophils Absolute: 0.1 K/uL (ref 0.0–0.5)
Eosinophils Relative: 2 %
HCT: 39.6 % (ref 39.0–52.0)
Hemoglobin: 12.9 g/dL — ABNORMAL LOW (ref 13.0–17.0)
Immature Granulocytes: 0 %
Lymphocytes Relative: 45 %
Lymphs Abs: 1.6 K/uL (ref 0.7–4.0)
MCH: 28.2 pg (ref 26.0–34.0)
MCHC: 32.6 g/dL (ref 30.0–36.0)
MCV: 86.5 fL (ref 80.0–100.0)
Monocytes Absolute: 0.5 K/uL (ref 0.1–1.0)
Monocytes Relative: 14 %
Neutro Abs: 1.5 K/uL — ABNORMAL LOW (ref 1.7–7.7)
Neutrophils Relative %: 39 %
Platelets: 484 K/uL — ABNORMAL HIGH (ref 150–400)
RBC: 4.58 MIL/uL (ref 4.22–5.81)
RDW: 13.5 % (ref 11.5–15.5)
WBC: 3.7 K/uL — ABNORMAL LOW (ref 4.0–10.5)
nRBC: 0 % (ref 0.0–0.2)

## 2024-03-29 LAB — COMPREHENSIVE METABOLIC PANEL WITH GFR
ALT: 82 U/L — ABNORMAL HIGH (ref 0–44)
AST: 37 U/L (ref 15–41)
Albumin: 4.2 g/dL (ref 3.5–5.0)
Alkaline Phosphatase: 170 U/L — ABNORMAL HIGH (ref 38–126)
Anion gap: 15 (ref 5–15)
BUN: 10 mg/dL (ref 6–20)
CO2: 23 mmol/L (ref 22–32)
Calcium: 9.6 mg/dL (ref 8.9–10.3)
Chloride: 100 mmol/L (ref 98–111)
Creatinine, Ser: 0.75 mg/dL (ref 0.61–1.24)
GFR, Estimated: >60 mL/min
Glucose, Bld: 91 mg/dL (ref 70–99)
Potassium: 4.1 mmol/L (ref 3.5–5.1)
Sodium: 139 mmol/L (ref 135–145)
Total Bilirubin: 0.4 mg/dL (ref 0.0–1.2)
Total Protein: 7.4 g/dL (ref 6.5–8.1)

## 2024-03-29 LAB — MAGNESIUM: Magnesium: 2.2 mg/dL (ref 1.7–2.4)

## 2024-03-29 MED ORDER — REPATHA SURECLICK 140 MG/ML ~~LOC~~ SOAJ
140.0000 mg | SUBCUTANEOUS | 1 refills | Status: AC
  Filled 2024-03-29 (×2): qty 6, 84d supply, fill #0
  Filled 2024-04-05: qty 2, 28d supply, fill #0

## 2024-03-29 MED ORDER — DOCUSATE SODIUM 100 MG PO CAPS: 100.0000 mg | ORAL_CAPSULE | Freq: Two times a day (BID) | ORAL | 0 refills | Status: AC

## 2024-03-29 MED ORDER — SENNA 8.6 MG PO TABS: 1.0000 | ORAL_TABLET | Freq: Every day | ORAL | 0 refills | Status: AC

## 2024-03-29 NOTE — Progress Notes (Unsigned)
 " Patient Care Team: Duanne Butler DASEN, MD as PCP - General (Family Medicine) [name], MD as Medical Oncologist (Medical Oncology) Celestia [name], RN as Oncology Nurse Navigator (Medical Oncology)  Clinic Day:  03/29/2024  Referring physician: Duanne Butler DASEN, MD   CHIEF COMPLAINT:  CC: Metastatic Non small cell lung carcinoma   ASSESSMENT & PLAN:   Assessment & Plan: Jeremy Hancock  is a 52 y.o. 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 Biopsy confirmed. PET scan shows metastatic spread.  MRI brain negative for brain metastasis Started on chemotherapy with carboplatin  and pemetrexed  on 03/10/2024 [facility] NGS with no actionable mutations and PDL1 of 0%   - Discussed adding dual immunotherapy with Ipilimumab and Nivolumab based on above NGS results - Reviewed risks Vs benefits along with side effects in detail. Immunotherapy side effects to include rash, thyroid abnormalities, inflammation of lungs, liver and colon.  - Provided printed information on dual immunotherapy. - Will Coordinate with radiation oncology for timing of chemotherapy and radiation. - Planned PET scan after four chemotherapy cycles to assess response. - Discussed that treatment would be with a palliative intent and not curative intent with continuing treatments as long as the patient tolerates it and has no disease progression. - Advised to report rash, dyspnea, diarrhea, or abdominal pain.  Return to clinic with next cycle to assess for response  Constipation due to opioid use and possible rectal mass Severe constipation likely from opioid use and possible rectal obstruction. Bowel regimen ineffective. Gastroenterology evaluation and colonoscopy  pending.  - Ordered two additional stool softeners- Colace and senna. - Advised consistent use of bowel regimen including lactulose , stool softeners, increased dietary fiber, hydration, and ambulation. - Will contact gastroenterology to expedite evaluation and colonoscopy. - Discussed possible obstruction from rectal mass and need for diagnosis prior to radiation. - Advised to monitor for worsening symptoms and report persistent obstruction or severe constipation.  Pain due to malignancy Pain in hip, shoulder, and posterior leg likely related to malignancy, possibly exacerbated by constipation. Current opioid use every six hours but patient unsure if he needs it.  - Advised reduction of opioid dose to 5 mg every six hours, with escalation only if needed. - Reinforced use of analgesics as needed rather than on a fixed schedule. - Assessed pain control and impact on bowel function. - Palliative radiation planned to start next week.   Nicotine dependence, currently not smoking Nicotine dependence with abstinence since before first chemotherapy cycle. Minimal vaping, motivated to maintain abstinence.  - Provided positive reinforcement for smoking cessation. - Encouraged continued abstinence from nicotine products.    The patient understands the plans discussed today and is in agreement with them.  He knows to contact our office if he develops concerns prior to his next appointment.  30 minutes of total time was spent for this patient encounter, including preparation,face-to-face counseling with the patient and coordination of care, physical exam, and documentation of the encounter.   Siad Davonna, MD  [facility] [place] - A DEPT OF [facility] [address] [place] [address] Dept: [phone] Dept Fax: [phone]   Orders Placed This Encounter  Procedures   CBC with Differential    Standing Status:  Future     Expected Date:   04/08/2024    Expiration Date:   04/08/2025   Comprehensive metabolic panel    Standing Status:   Future    Expected Date:   04/08/2024    Expiration Date:   04/08/2025   T4    Standing Status:   Future    Expected Date:   04/08/2024    Expiration Date:   04/08/2025   TSH    Standing Status:   Future    Expected Date:   04/08/2024    Expiration Date:   04/08/2025   CBC with Differential    Standing Status:   Future    Expected Date:   04/29/2024    Expiration Date:   04/29/2025   Comprehensive metabolic panel    Standing Status:   Future    Expected Date:   04/29/2024    Expiration Date:   04/29/2025   CBC with Differential    Standing Status:   Future    Expected Date:   05/20/2024    Expiration Date:   05/20/2025   Comprehensive metabolic panel    Standing Status:   Future    Expected Date:   05/20/2024    Expiration Date:   05/20/2025   T4    Standing Status:   Future    Expected Date:   05/20/2024    Expiration Date:   05/20/2025   TSH    Standing Status:   Future    Expected Date:   05/20/2024    Expiration Date:   05/20/2025   CBC with Differential    Standing Status:   Future    Expected Date:   06/10/2024    Expiration Date:   06/10/2025   Comprehensive metabolic panel    Standing Status:   Future    Expected Date:   06/10/2024    Expiration Date:   06/10/2025   CBC with Differential    Standing Status:   Future    Expected Date:   07/01/2024    Expiration Date:   07/01/2025   Comprehensive metabolic panel    Standing Status:   Future    Expected Date:   07/01/2024    Expiration Date:   07/01/2025   T4    Standing Status:   Future    Expected Date:   07/01/2024    Expiration Date:   07/01/2025   TSH    Standing Status:   Future    Expected Date:   07/01/2024    Expiration Date:   07/01/2025   CBC with Differential    Standing Status:   Future    Expected Date:   07/22/2024    Expiration Date:   07/22/2025   Comprehensive metabolic panel    Standing Status:   Future     Expected Date:   07/22/2024    Expiration Date:   07/22/2025   CBC with Differential    Standing Status:   Future    Expected Date:   08/12/2024    Expiration Date:   08/12/2025   Comprehensive metabolic panel    Standing Status:   Future    Expected Date:   08/12/2024    Expiration Date:   08/12/2025   T4    Standing Status:   Future    Expected Date:   08/12/2024    Expiration Date:   08/12/2025   TSH    Standing Status:   Future    Expected  Date:   08/12/2024    Expiration Date:   08/12/2025   CBC with Differential    Standing Status:   Future    Expected Date:   09/02/2024    Expiration Date:   09/02/2025   Comprehensive metabolic panel    Standing Status:   Future    Expected Date:   09/02/2024    Expiration Date:   09/02/2025   CBC with Differential    Standing Status:   Future    Expected Date:   09/23/2024    Expiration Date:   09/23/2025   Comprehensive metabolic panel    Standing Status:   Future    Expected Date:   09/23/2024    Expiration Date:   09/23/2025   T4    Standing Status:   Future    Expected Date:   09/23/2024    Expiration Date:   09/23/2025   TSH    Standing Status:   Future    Expected Date:   09/23/2024    Expiration Date:   09/23/2025   CBC with Differential    Standing Status:   Future    Expected Date:   10/14/2024    Expiration Date:   10/14/2025   Comprehensive metabolic panel    Standing Status:   Future    Expected Date:   10/14/2024    Expiration Date:   10/14/2025   CBC with Differential    Standing Status:   Future    Expected Date:   11/04/2024    Expiration Date:   11/04/2025   Comprehensive metabolic panel    Standing Status:   Future    Expected Date:   11/04/2024    Expiration Date:   11/04/2025   T4    Standing Status:   Future    Expected Date:   11/04/2024    Expiration Date:   11/04/2025   TSH    Standing Status:   Future    Expected Date:   11/04/2024    Expiration Date:   11/04/2025   CBC with Differential    Standing Status:   Future    Expected  Date:   11/25/2024    Expiration Date:   11/25/2025   Comprehensive metabolic panel    Standing Status:   Future    Expected Date:   11/25/2024    Expiration Date:   11/25/2025   CBC with Differential    Standing Status:   Future    Expected Date:   12/16/2024    Expiration Date:   12/16/2025   Comprehensive metabolic panel    Standing Status:   Future    Expected Date:   12/16/2024    Expiration Date:   12/16/2025   T4    Standing Status:   Future    Expected Date:   12/16/2024    Expiration Date:   12/16/2025   TSH    Standing Status:   Future    Expected Date:   12/16/2024    Expiration Date:   12/16/2025   CBC with Differential    Standing Status:   Future    Expected Date:   01/06/2025    Expiration Date:   01/06/2026   Comprehensive metabolic panel    Standing Status:   Future    Expected Date:   01/06/2025    Expiration Date:   01/06/2026     ONCOLOGY HISTORY:   Diagnosis: Metastatic Non small cell lung carcinoma   -02/05/2024: Left  shoulder x-ray: Soft tissue density in the region of the aortic knob with irregular contour, possible underlying mass. -02/14/2024: CTA CAP: Left apical paramediastinal mass with mediastinal invasion and abutment of the aortic arch, suspicious for a primary bronchogenic malignancy. Associated Pathologic left hilar adenopathy. Lytic osseous metastases involving the left scapula and left iliac wing, as well as Right gluteal subcutaneous soft tissue mass, suspicious for soft tissue metastasis. Despite the lesion noted within the rectum, these are favored to represent metastases related to a primary bronchogenic malignancy. Exophytic eccentric rectal mass, suspicious for a concurrent primary rectal malignancy. -02/14/2024: CT Head and Cervical spine: NED -02/17/2024: CEA: 46.0. Chromogranin A: Normal. CA 19-9: Normal. -02/20/2024: Right gluteal mass biopsy.  Pathology: Metastatic carcinoma, consistent with lung primary. Tumor cells stain strongly and diffusely  positively for TTF-1, and are negative for CDX2, GATA3, NKX3.1.  -02/22/2024: Initial PET: Hypermetabolic medial left upper lobe lung mass measuring 4.7 x 3.2 cm (SUV max 13.2), compatible with primary malignancy. Hypermetabolic nodal metastases in the AP window and a 13 mm left peribronchial node. Hypermetabolic rounded lesion anterior to the mid rectum compatible with malignancy. Multiple hypermetabolic osseous metastases with lytic/destructive lesions. Hypermetabolic soft tissue metastasis within the right flank superficial to the gluteus maximus muscle measuring 3.6 cm. -02/23/2024: MRI Brain: NED -03/10/2024- Current: Carboplatin  + Pemetrexed  -03/17/2024: [facility] NGS: Negative for ALK, BRAF, EGFR, MET, RET, ROS1, MTRK 1/2/3, PDL1: TPS:0  - KRAS p.G12D: Positive  Current Treatment:  Carboplatin  + Pemetrexed   INTERVAL HISTORY:   Discussed the use of AI scribe software for clinical note transcription with the patient, who gave verbal consent to proceed.  History of Present Illness Jeremy Hancock is a 53 year old male with advanced non-small cell lung cancer undergoing chemotherapy who presents for oncology follow-up. He is accompanied by his father and mother today.  He completed his first cycle of chemotherapy for NSCLC without experiencing nausea or vomiting and notes a significant reduction in the size of the previously biopsied gluteal mass. Radiation therapy is scheduled to begin next Monday. There are ongoing discussions with radiation oncology regarding inclusion of the rectal region in the radiation field, pending tissue diagnosis. Molecular testing revealed no actionable mutations and PDL1 expression is 0%.  He has not had a bowel movement in approximately five days, despite adequate oral intake and use of prune juice, lactulose , and stool softeners. He attributes constipation to both opioid use and possible obstruction from the rectal mass.  He describes pain in the hip,  shoulder, and posterior leg, which he associates with both malignancy and constipation. He is currently taking opioid analgesics every six hours, two tablets at a time. He recalls a prior episode of inadequate pain control resulting in an emergency department visit for shortness of breath and back pain.  He quit smoking the day before his last chemotherapy session, with no whole cigarettes in the past four to five days, though he occasionally takes a puff and uses a vape infrequently.    I have reviewed the past medical history, past surgical history, social history and family history with the patient and they are unchanged from previous note.  ALLERGIES:  is allergic to amoxicillin , codeine, crestor [rosuvastatin], hydrocodone -acetaminophen , lipitor [atorvastatin], morphine and codeine, statins, zetia  [ezetimibe ], and zyrtec  [cetirizine ].  MEDICATIONS:  Current Outpatient Medications  Medication Sig Dispense Refill   docusate sodium  (COLACE) 100 MG capsule Take 1 capsule (100 mg total) by mouth 2 (two) times daily. 10 capsule 0   ibuprofen  (ADVIL ) 800 MG  tablet Take 800 mg by mouth every 8 (eight) hours as needed for moderate pain.     lidocaine -prilocaine  (EMLA ) cream Apply 1 Application topically as needed. 30 g 0   meloxicam  (MOBIC ) 15 MG tablet Take 1 tablet (15 mg total) by mouth daily. 90 tablet 2   Multiple Vitamin (MULTIVITAMIN) tablet Take 1 tablet by mouth daily.     oxyCODONE  (OXY IR/ROXICODONE ) 5 MG immediate release tablet Take 2 tablets (10 mg total) by mouth every 6 (six) hours as needed for severe pain (pain score 7-10). 240 tablet 0   senna (SENOKOT) 8.6 MG TABS tablet Take 1 tablet (8.6 mg total) by mouth daily. 120 tablet 0   Evolocumab  (REPATHA  SURECLICK) 140 MG/ML SOAJ Inject 140 mg into the skin every 14 (fourteen) days. 6 mL 1   Evolocumab  (REPATHA  SURECLICK) 140 MG/ML SOAJ Inject 140 mg into the skin every 14 (fourteen) days. 6 mL 1   lactulose  (CHRONULAC ) 10 GM/15ML  solution TAKE 30 MLS BY MOUTH AT BEDTIME EVERY NIGHT TO ASSIST WITH REGULAR BOWEL MOVEMENTS. (Patient not taking: Reported on 03/29/2024) 473 mL 0   No current facility-administered medications for this visit.     VITALS:  There were no vitals taken for this visit.  Wt Readings from Last 3 Encounters:  03/29/24 134 lb 7.7 oz (61 kg)  03/16/24 135 lb 3.2 oz (61.3 kg)  03/10/24 139 lb 9.6 oz (63.3 kg)    There is no height or weight on file to calculate BMI.  Performance status (ECOG): 0 - Asymptomatic  PHYSICAL EXAM:   GENERAL:alert, no distress and comfortable SKIN: skin color, texture, turgor are normal, no rashes or significant lesions LYMPH:  no palpable lymphadenopathy in the cervical, axillary or inguinal LUNGS: clear to auscultation and percussion with normal breathing effort HEART: regular rate & rhythm and no murmurs and no lower extremity edema ABDOMEN:abdomen soft, non-tender and normal bowel sounds Musculoskeletal:no cyanosis of digits and no clubbing  NEURO: alert & oriented x 3 with fluent speech  LABORATORY DATA:  I have reviewed the data as listed     Component Value Date/Time   NA 139 03/29/2024 1340   NA 140 04/05/2013 1943   K 4.1 03/29/2024 1340   K 3.7 04/05/2013 1943   CL 100 03/29/2024 1340   CL 106 04/05/2013 1943   CO2 23 03/29/2024 1340   CO2 30 04/05/2013 1943   GLUCOSE 91 03/29/2024 1340   GLUCOSE 84 04/05/2013 1943   BUN 10 03/29/2024 1340   BUN 8 04/05/2013 1943   CREATININE 0.75 03/29/2024 1340   CREATININE 0.87 02/03/2024 0906   CALCIUM 9.6 03/29/2024 1340   CALCIUM 8.9 04/05/2013 1943   PROT 7.4 03/29/2024 1340   PROT 6.9 01/07/2024 0815   PROT 7.1 04/05/2013 1943   ALBUMIN 4.2 03/29/2024 1340   ALBUMIN 4.3 01/07/2024 0815   ALBUMIN 3.7 04/05/2013 1943   AST 37 03/29/2024 1340   AST 28 04/05/2013 1943   ALT 82 (H) 03/29/2024 1340   ALT 49 04/05/2013 1943   ALKPHOS 170 (H) 03/29/2024 1340   ALKPHOS 98 04/05/2013 1943    BILITOT 0.4 03/29/2024 1340   BILITOT 0.4 01/07/2024 0815   BILITOT 0.2 04/05/2013 1943   GFRNONAA >60 03/29/2024 1340   GFRNONAA >89 07/31/2015 0846   GFRAA >60 05/24/2019 1246   GFRAA >89 07/31/2015 0846     Lab Results  Component Value Date   WBC 3.7 (L) 03/29/2024   NEUTROABS 1.5 (L) 03/29/2024  HGB 12.9 (L) 03/29/2024   HCT 39.6 03/29/2024   MCV 86.5 03/29/2024   PLT 484 (H) 03/29/2024      Chemistry      Component Value Date/Time   NA 139 03/29/2024 1340   NA 140 04/05/2013 1943   K 4.1 03/29/2024 1340   K 3.7 04/05/2013 1943   CL 100 03/29/2024 1340   CL 106 04/05/2013 1943   CO2 23 03/29/2024 1340   CO2 30 04/05/2013 1943   BUN 10 03/29/2024 1340   BUN 8 04/05/2013 1943   CREATININE 0.75 03/29/2024 1340   CREATININE 0.87 02/03/2024 0906      Component Value Date/Time   CALCIUM 9.6 03/29/2024 1340   CALCIUM 8.9 04/05/2013 1943   ALKPHOS 170 (H) 03/29/2024 1340   ALKPHOS 98 04/05/2013 1943   AST 37 03/29/2024 1340   AST 28 04/05/2013 1943   ALT 82 (H) 03/29/2024 1340   ALT 49 04/05/2013 1943   BILITOT 0.4 03/29/2024 1340   BILITOT 0.4 01/07/2024 0815   BILITOT 0.2 04/05/2013 1943        RADIOGRAPHIC STUDIES: I have personally reviewed the radiological images as listed and agreed with the findings in the report.  "

## 2024-03-29 NOTE — Telephone Encounter (Signed)
" °*  STAT* If patient is at the pharmacy, call can be transferred to refill team.   1. Which medications need to be refilled? (please list name of each medication and dose if known)   Evolocumab  (REPATHA  SURECLICK) 140 MG/ML SOAJ     2. Would you like to learn more about the convenience, safety, & potential cost savings by using the [facility]? NO    3. Are you open to using the [facility] ([facility]. No    4. Which pharmacy/location (including street and city if local pharmacy) is medication to be sent to?[place] [facility]    5. Do they need a 30 day or 90 day supply?   Pt asking he be  "

## 2024-03-29 NOTE — Patient Instructions (Signed)
 [facility] Discharge Instructions   You were seen and examined today by Dr. Davonna.  She reviewed the results of your lab work which are normal/stable.   We will proceed with your treatment on 12/31.   Return as scheduled.    Thank you for choosing [facility] to provide your oncology and hematology care.  To afford each patient quality time with our provider, please arrive at least 15 minutes before your scheduled appointment time.   If you have a lab appointment with the Cancer Center please come in thru the Main Entrance and check in at the main information desk.  You need to re-schedule your appointment should you arrive 10 or more minutes late.  We strive to give you quality time with our providers, and arriving late affects you and other patients whose appointments are after yours.  Also, if you no show three or more times for appointments you may be dismissed from the clinic at the providers discretion.     Again, thank you for choosing [facility].  Our hope is that these requests will decrease the amount of time that you wait before being seen by our physicians.       _____________________________________________________________  Should you have questions after your visit to [facility], please contact our office at [phone] and follow the prompts.  Our office hours are 8:00 a.m. and 4:30 p.m. Monday - Friday.  Please note that voicemails left after 4:00 p.m. may not be returned until the following business day.  We are closed weekends and major holidays.  You do have access to a nurse 24-7, just call the main number to the clinic [phone] and do not press any options, hold on the line and a nurse will answer the phone.    For prescription refill requests, have your pharmacy contact our office and allow 72 hours.    Due to Covid, you will need to wear a mask upon entering  the hospital. If you do not have a mask, a mask will be given to you at the Main Entrance upon arrival. For doctor visits, patients may have 1 support person age 27 or older with them. For treatment visits, patients can not have anyone with them due to social distancing guidelines and our immunocompromised population.

## 2024-03-29 NOTE — Progress Notes (Signed)
 Patients port flushed without difficulty.  Good blood return noted with no bruising or swelling noted at site.  Labs drawn per orders.  VSS with discharge and left in satisfactory condition with no s/s of distress noted. All follow ups as scheduled.       Jeremy Hancock

## 2024-03-30 ENCOUNTER — Encounter: Payer: Self-pay | Admitting: Oncology

## 2024-03-30 ENCOUNTER — Other Ambulatory Visit (HOSPITAL_COMMUNITY): Payer: Self-pay

## 2024-03-30 ENCOUNTER — Other Ambulatory Visit (HOSPITAL_BASED_OUTPATIENT_CLINIC_OR_DEPARTMENT_OTHER): Payer: Self-pay

## 2024-03-30 MED ORDER — REPATHA SURECLICK 140 MG/ML ~~LOC~~ SOAJ
1.0000 mL | SUBCUTANEOUS | 1 refills | Status: AC
  Filled 2024-03-30: qty 6, 84d supply, fill #0
  Filled 2024-04-28: qty 2, 28d supply, fill #0

## 2024-03-31 ENCOUNTER — Inpatient Hospital Stay

## 2024-04-01 ENCOUNTER — Encounter: Payer: Self-pay | Admitting: Oncology

## 2024-04-02 ENCOUNTER — Encounter: Payer: Self-pay | Admitting: Oncology

## 2024-04-02 NOTE — Telephone Encounter (Signed)
" °*  STAT* If patient is at the pharmacy, call can be transferred to refill team.   1. Which medications need to be refilled? (please list name of each medication and dose if known)   Evolocumab  (REPATHA  SURECLICK) 140 MG/ML SOAJ   2. Would you like to learn more about the convenience, safety, & potential cost savings by using the [facility]?   3. Are you open to using the [facility] (Type [facility]. ).  4. Which pharmacy/location (including street and city if local pharmacy) is medication to be sent to?  [place] [facility]   5. Do they need a 30 day or 90 day supply?   Patient stated his medication has not been delivered as yet and noted he is temporarily staying at [address], [place], [place].  Patient noted he is completely out of this medication and his next shot is due today. "

## 2024-04-02 NOTE — Progress Notes (Signed)
" °  Rapid Diagnostic Service for Malignancies [facility]  Diagnostic Nurse Navigator Treatment Team Hand-Off Note  04/02/2024  Patient Name:  Jeremy Hancock Patient MRN:  [id number] Patient DOB:  09-09-1970   Patient Care Team: Duanne Butler DASEN, MD as PCP - General (Family Medicine) [name], MD as Medical Oncologist (Medical Oncology) Celestia [name], RN as Oncology Nurse Navigator (Medical Oncology)  Chief Complaint Lung mass  Oncology History  Malignant neoplasm of left lung [facility])  03/04/2024 Initial Diagnosis   Malignant neoplasm of left lung (HCC)   03/10/2024 - 03/10/2024 Chemotherapy   Patient is on Treatment Plan : LUNG NSCLC Pemetrexed  + Carboplatin  q21d x 4 Cycles     04/08/2024 -  Chemotherapy   Patient is on Treatment Plan : LUNG NSCLC NON-SQUAMOUS Nivolumab + Ipilimumab + Carboplatin  + Pemetrexed  q42d X 1 cycle / Maintenance Nivolumab + Ipilimumab q42d       Cancer Staging  No matching staging information was found for the patient.   SDOH Screening and Interventions Updated:  Yes  SDOH Screenings   Food Insecurity: No Food Insecurity (03/16/2024)  Housing: Low Risk (03/16/2024)  Transportation Needs: No Transportation Needs (03/16/2024)  Utilities: Not At Risk (03/16/2024)  Alcohol Screen: Low Risk (09/11/2023)  Depression (PHQ2-9): Low Risk (03/16/2024)  Financial Resource Strain: Low Risk (09/11/2023)  Physical Activity: Insufficiently Active (09/11/2023)  Social Connections: Moderately Isolated (08/08/2022)  Stress: No Stress Concern Present (09/11/2023)  Tobacco Use: Medium Risk (03/15/2024)  Health Literacy: Adequate Health Literacy (09/11/2023)     Genetics Assessment Completed:  No Genetics Referral Made:  no  Care Team Updated:  Yes  "

## 2024-04-05 ENCOUNTER — Ambulatory Visit
Admission: RE | Admit: 2024-04-05 | Discharge: 2024-04-05 | Disposition: A | Discharge status: Home / Self Care | Source: Ambulatory Visit | Attending: Radiation Oncology | Admitting: Radiation Oncology

## 2024-04-05 ENCOUNTER — Encounter: Payer: Self-pay | Admitting: Oncology

## 2024-04-05 ENCOUNTER — Other Ambulatory Visit (HOSPITAL_COMMUNITY): Payer: Self-pay

## 2024-04-05 ENCOUNTER — Inpatient Hospital Stay: Payer: Self-pay

## 2024-04-05 ENCOUNTER — Other Ambulatory Visit: Payer: Self-pay

## 2024-04-05 DIAGNOSIS — G893 Neoplasm related pain (acute) (chronic): Secondary | ICD-10-CM | POA: Insufficient documentation

## 2024-04-05 DIAGNOSIS — Z87891 Personal history of nicotine dependence: Secondary | ICD-10-CM | POA: Insufficient documentation

## 2024-04-05 DIAGNOSIS — C3412 Malignant neoplasm of upper lobe, left bronchus or lung: Secondary | ICD-10-CM | POA: Diagnosis not present

## 2024-04-05 DIAGNOSIS — C7951 Secondary malignant neoplasm of bone: Secondary | ICD-10-CM | POA: Insufficient documentation

## 2024-04-05 LAB — RAD ONC ARIA SESSION SUMMARY

## 2024-04-05 NOTE — Telephone Encounter (Signed)
 Message sent to [facility]

## 2024-04-06 ENCOUNTER — Ambulatory Visit

## 2024-04-06 ENCOUNTER — Ambulatory Visit
Admission: RE | Admit: 2024-04-06 | Discharge: 2024-04-06 | Disposition: A | Source: Ambulatory Visit | Attending: Radiation Oncology | Admitting: Radiation Oncology

## 2024-04-06 ENCOUNTER — Encounter: Payer: Self-pay | Admitting: *Deleted

## 2024-04-06 ENCOUNTER — Other Ambulatory Visit: Payer: Self-pay

## 2024-04-06 DIAGNOSIS — C7951 Secondary malignant neoplasm of bone: Secondary | ICD-10-CM | POA: Diagnosis not present

## 2024-04-06 LAB — RAD ONC ARIA SESSION SUMMARY

## 2024-04-07 ENCOUNTER — Other Ambulatory Visit: Payer: Self-pay

## 2024-04-07 ENCOUNTER — Ambulatory Visit
Admission: RE | Admit: 2024-04-07 | Discharge: 2024-04-07 | Disposition: A | Discharge status: Home / Self Care | Source: Ambulatory Visit | Attending: Radiation Oncology | Admitting: Radiation Oncology

## 2024-04-07 DIAGNOSIS — C7951 Secondary malignant neoplasm of bone: Secondary | ICD-10-CM | POA: Diagnosis not present

## 2024-04-07 LAB — RAD ONC ARIA SESSION SUMMARY

## 2024-04-08 ENCOUNTER — Inpatient Hospital Stay: Payer: Self-pay

## 2024-04-08 ENCOUNTER — Inpatient Hospital Stay: Payer: Self-pay | Attending: Oncology

## 2024-04-08 ENCOUNTER — Ambulatory Visit

## 2024-04-08 VITALS — BP 98/65 | HR 76 | Temp 96.7°F | Resp 20 | Wt 130.0 lb

## 2024-04-08 VITALS — BP 91/57 | HR 68

## 2024-04-08 DIAGNOSIS — Z5111 Encounter for antineoplastic chemotherapy: Secondary | ICD-10-CM | POA: Diagnosis present

## 2024-04-08 DIAGNOSIS — Z5112 Encounter for antineoplastic immunotherapy: Secondary | ICD-10-CM | POA: Insufficient documentation

## 2024-04-08 DIAGNOSIS — Z87891 Personal history of nicotine dependence: Secondary | ICD-10-CM | POA: Insufficient documentation

## 2024-04-08 DIAGNOSIS — C7951 Secondary malignant neoplasm of bone: Secondary | ICD-10-CM | POA: Diagnosis not present

## 2024-04-08 DIAGNOSIS — C3492 Malignant neoplasm of unspecified part of left bronchus or lung: Secondary | ICD-10-CM | POA: Diagnosis not present

## 2024-04-08 DIAGNOSIS — C7989 Secondary malignant neoplasm of other specified sites: Secondary | ICD-10-CM | POA: Diagnosis not present

## 2024-04-08 LAB — CBC WITH DIFFERENTIAL/PLATELET
Abs Immature Granulocytes: 0.05 K/uL (ref 0.00–0.07)
Basophils Absolute: 0.1 K/uL (ref 0.0–0.1)
Basophils Relative: 1 %
Eosinophils Absolute: 0 K/uL (ref 0.0–0.5)
Eosinophils Relative: 1 %
HCT: 37 % — ABNORMAL LOW (ref 39.0–52.0)
Hemoglobin: 12.1 g/dL — ABNORMAL LOW (ref 13.0–17.0)
Immature Granulocytes: 1 %
Lymphocytes Relative: 20 %
Lymphs Abs: 1.6 K/uL (ref 0.7–4.0)
MCH: 27.8 pg (ref 26.0–34.0)
MCHC: 32.7 g/dL (ref 30.0–36.0)
MCV: 85.1 fL (ref 80.0–100.0)
Monocytes Absolute: 1.1 K/uL — ABNORMAL HIGH (ref 0.1–1.0)
Monocytes Relative: 13 %
Neutro Abs: 5.2 K/uL (ref 1.7–7.7)
Neutrophils Relative %: 64 %
Platelets: 420 K/uL — ABNORMAL HIGH (ref 150–400)
RBC: 4.35 MIL/uL (ref 4.22–5.81)
RDW: 13.7 % (ref 11.5–15.5)
WBC: 8.1 K/uL (ref 4.0–10.5)
nRBC: 0 % (ref 0.0–0.2)

## 2024-04-08 LAB — COMPREHENSIVE METABOLIC PANEL WITH GFR
ALT: 33 U/L (ref 0–44)
AST: 23 U/L (ref 15–41)
Albumin: 3.8 g/dL (ref 3.5–5.0)
Alkaline Phosphatase: 152 U/L — ABNORMAL HIGH (ref 38–126)
Anion gap: 15 (ref 5–15)
BUN: 6 mg/dL (ref 6–20)
CO2: 23 mmol/L (ref 22–32)
Calcium: 9.4 mg/dL (ref 8.9–10.3)
Chloride: 102 mmol/L (ref 98–111)
Creatinine, Ser: 0.7 mg/dL (ref 0.61–1.24)
GFR, Estimated: >60 mL/min
Glucose, Bld: 85 mg/dL (ref 70–99)
Potassium: 3.8 mmol/L (ref 3.5–5.1)
Sodium: 139 mmol/L (ref 135–145)
Total Bilirubin: 0.3 mg/dL (ref 0.0–1.2)
Total Protein: 7 g/dL (ref 6.5–8.1)

## 2024-04-08 LAB — TSH: TSH: 1.68 u[IU]/mL (ref 0.350–4.500)

## 2024-04-08 LAB — MAGNESIUM: Magnesium: 2.2 mg/dL (ref 1.7–2.4)

## 2024-04-08 MED ORDER — PROCHLORPERAZINE MALEATE 10 MG PO TABS: 10.0000 mg | ORAL_TABLET | Freq: Four times a day (QID) | ORAL | 1 refills | Status: AC | PRN

## 2024-04-08 MED ORDER — PALONOSETRON HCL INJECTION 0.25 MG/5ML
0.2500 mg | Freq: Once | INTRAVENOUS | Status: AC
  Administered 2024-04-08: 0.25 mg via INTRAVENOUS
  Filled 2024-04-08: qty 5

## 2024-04-08 MED ORDER — SODIUM CHLORIDE 0.9 % IV SOLN
500.0000 mg/m2 | Freq: Once | INTRAVENOUS | Status: AC
  Administered 2024-04-08: 800 mg via INTRAVENOUS
  Filled 2024-04-08: qty 20

## 2024-04-08 MED ORDER — DEXAMETHASONE SOD PHOSPHATE PF 10 MG/ML IJ SOLN
10.0000 mg | Freq: Once | INTRAMUSCULAR | Status: AC
  Administered 2024-04-08: 10 mg via INTRAVENOUS
  Filled 2024-04-08: qty 1

## 2024-04-08 MED ORDER — SODIUM CHLORIDE 0.9 % IV SOLN: INTRAVENOUS | Status: DC

## 2024-04-08 MED ORDER — SODIUM CHLORIDE 0.9 % IV SOLN
150.0000 mg | Freq: Once | INTRAVENOUS | Status: AC
  Administered 2024-04-08: 150 mg via INTRAVENOUS
  Filled 2024-04-08: qty 150

## 2024-04-08 MED ORDER — DEXAMETHASONE 4 MG PO TABS: ORAL_TABLET | ORAL | 1 refills | Status: AC

## 2024-04-08 MED ORDER — CYANOCOBALAMIN 1000 MCG/ML IJ SOLN
1000.0000 ug | Freq: Once | INTRAMUSCULAR | Status: AC
  Administered 2024-04-08: 1000 ug via INTRAMUSCULAR
  Filled 2024-04-08: qty 1

## 2024-04-08 MED ORDER — DIPHENHYDRAMINE HCL 50 MG/ML IJ SOLN
25.0000 mg | Freq: Once | INTRAMUSCULAR | Status: AC
  Administered 2024-04-08: 25 mg via INTRAVENOUS
  Filled 2024-04-08: qty 1

## 2024-04-08 MED ORDER — SODIUM CHLORIDE 0.9 % IV SOLN
1.0000 mg/kg | Freq: Once | INTRAVENOUS | Status: AC
  Administered 2024-04-08: 60 mg via INTRAVENOUS
  Filled 2024-04-08: qty 12

## 2024-04-08 MED ORDER — SODIUM CHLORIDE 0.9 % IV SOLN
360.0000 mg | Freq: Once | INTRAVENOUS | Status: AC
  Administered 2024-04-08: 360 mg via INTRAVENOUS
  Filled 2024-04-08: qty 24

## 2024-04-08 MED ORDER — SODIUM CHLORIDE 0.9 % IV SOLN
596.5000 mg | Freq: Once | INTRAVENOUS | Status: AC
  Administered 2024-04-08: 600 mg via INTRAVENOUS
  Filled 2024-04-08: qty 60

## 2024-04-08 MED ORDER — LIDOCAINE-PRILOCAINE 2.5-2.5 % EX CREA: TOPICAL_CREAM | CUTANEOUS | 3 refills | Status: AC

## 2024-04-08 MED ORDER — CYANOCOBALAMIN 1000 MCG/ML IJ SOLN: 1000.0000 ug | Freq: Once | INTRAMUSCULAR | Status: DC

## 2024-04-08 MED ORDER — FAMOTIDINE IN NACL 20-0.9 MG/50ML-% IV SOLN
20.0000 mg | Freq: Once | INTRAVENOUS | Status: AC
  Administered 2024-04-08: 20 mg via INTRAVENOUS
  Filled 2024-04-08: qty 50

## 2024-04-08 MED ORDER — FOLIC ACID 1 MG PO TABS: 1.0000 mg | ORAL_TABLET | Freq: Every day | ORAL | 3 refills | Status: AC

## 2024-04-08 MED ORDER — ONDANSETRON HCL 8 MG PO TABS: 8.0000 mg | ORAL_TABLET | Freq: Three times a day (TID) | ORAL | 1 refills | Status: AC | PRN

## 2024-04-08 NOTE — Progress Notes (Signed)
 Ipilimumab  (YERVOY ) Patient Monitoring Assessment   Is the patient experiencing any of the following general symptoms?:  [ ] Difficulty performing normal activities [ ] Feeling sluggish or cold all the time [ ] Unusual weight gain [ ] Constant or unusual headaches [ ] Feeling dizzy or faint [ ] Changes in eyesight (blurry vision, double vision, or other vision problems) [ ] Changes in mood or behavior (ex: decreased sex drive, irritability, or forgetfulness) [ ] Starting new medications (ex: steroids, other medications that lower immune response) [X ]Patient is not experiencing any of the general symptoms above.   Gastrointestinal  Patient is having 1 bowel movements each day.  Is this different from baseline? [ ] Yes [X ]No Are your stools watery or do they have a foul smell? [ ] Yes [ X]No Have you seen blood in your stools? [ ] Yes [ X]No Are your stools dark, tarry, or sticky? [ ] Yes [X ]No Are you having pain or tenderness in your belly? [ ] Yes [ X]No  Skin Does your skin itch? [ ] Yes [ X]No Do you have a rash? [ ] Yes [X ]No Has your skin blistered and/or peeled? [ ] Yes [X ]No Do you have sores in your mouth? [ ] Yes [ X]No  Hepatic Has your urine been dark or tea colored? [ ] Yes [ X]No Have you noticed that your skin or the whites of your eyes are turning yellow? [ ] Yes [ X]No Are you bleeding or bruising more easily than normal? [ ] Yes [ X]No Are you nauseous and/or vomiting? [ ] Yes [ X]No Do you have pain on the right side of your stomach? [ ] Yes [X ]No  Neurologic  Are you having unusual weakness of legs, arms, or face? [ ] Yes [ X]No Are you having numbness or tingling in your hands or feet? [ ] Yes [ X]No  Jeremy Hancock

## 2024-04-08 NOTE — Progress Notes (Signed)
 Patient presents today for C1D1 chemotherapy Opdivo /Yervoy  infusion.  Patient is in satisfactory condition with no new complaints voiced.  Vital signs are stable.  Labs reviewed and all labs are within treatment parameters.  We will proceed with treatment per MD orders.

## 2024-04-08 NOTE — Progress Notes (Signed)
 Pharmacist Chemotherapy Monitoring - Initial Assessment    Anticipated start date: 04/08/24   The following has been reviewed per standard work regarding the patient's treatment regimen: The patient's diagnosis, treatment plan and drug doses, and organ/hematologic function Lab orders and baseline tests specific to treatment regimen  The treatment plan start date, drug sequencing, and pre-medications Prior authorization status  Patient's documented medication list, including drug-drug interaction screen and prescriptions for anti-emetics and supportive care specific to the treatment regimen The drug concentrations, fluid compatibility, administration routes, and timing of the medications to be used The patient's access for treatment and lifetime cumulative dose history, if applicable  The patient's medication allergies and previous infusion related reactions, if applicable   Changes made to treatment plan:  magnesium  lab ordered to monitor Mag routinely with   Date plan changed  Follow up needed:  N/A PA approved per Jon Shope.   Jeremy Hancock, RPH, 04/08/2024  8:15 AM

## 2024-04-08 NOTE — Progress Notes (Signed)
Treatment given per orders. Patient tolerated it well without problems. Vitals stable and discharged home from clinic ambulatory. Follow up as scheduled.  

## 2024-04-08 NOTE — Patient Instructions (Signed)
 [facility] [place] - A DEPT OF [facility]  Discharge Instructions: Thank you for choosing [facility] to provide your oncology and hematology care.  If you have a lab appointment with the Cancer Center - please note that after April 8th, 2024, all labs will be drawn in the cancer center.  You do not have to check in or register with the main entrance as you have in the past but will complete your check-in in the cancer center.  Wear comfortable clothing and clothing appropriate for easy access to any Portacath or PICC line.   We strive to give you quality time with your provider. You may need to reschedule your appointment if you arrive late (15 or more minutes).  Arriving late affects you and other patients whose appointments are after yours.  Also, if you miss three or more appointments without notifying the office, you may be dismissed from the clinic at the providers discretion.      For prescription refill requests, have your pharmacy contact our office and allow 72 hours for refills to be completed.    Today you received the following chemotherapy and/or immunotherapy agents carboplatin , alimta , opdivo , yervoy    To help prevent nausea and vomiting after your treatment, we encourage you to take your nausea medication as directed.  BELOW ARE SYMPTOMS THAT SHOULD BE REPORTED IMMEDIATELY: *FEVER GREATER THAN 100.4 F (38 C) OR HIGHER *CHILLS OR SWEATING *NAUSEA AND VOMITING THAT IS NOT CONTROLLED WITH YOUR NAUSEA MEDICATION *UNUSUAL SHORTNESS OF BREATH *UNUSUAL BRUISING OR BLEEDING *URINARY PROBLEMS (pain or burning when urinating, or frequent urination) *BOWEL PROBLEMS (unusual diarrhea, constipation, pain near the anus) TENDERNESS IN MOUTH AND THROAT WITH OR WITHOUT PRESENCE OF ULCERS (sore throat, sores in mouth, or a toothache) UNUSUAL RASH, SWELLING OR PAIN  UNUSUAL VAGINAL DISCHARGE OR ITCHING   Items with * indicate a potential emergency  and should be followed up as soon as possible or go to the Emergency Department if any problems should occur.  Please show the CHEMOTHERAPY ALERT CARD or IMMUNOTHERAPY ALERT CARD at check-in to the Emergency Department and triage nurse.  Should you have questions after your visit or need to cancel or reschedule your appointment, please contact [facility] [place] - A DEPT OF [facility] [phone]  and follow the prompts.  Office hours are 8:00 a.m. to 4:30 p.m. Monday - Friday. Please note that voicemails left after 4:00 p.m. may not be returned until the following business day.  We are closed weekends and major holidays. You have access to a nurse at all times for urgent questions. Please call the main number to the clinic [phone] and follow the prompts.  For any non-urgent questions, you may also contact your provider using MyChart. We now offer e-Visits for anyone 78 and older to request care online for non-urgent symptoms. For details visit [email or web address].   Also download the MyChart app! Go to the app store, search MyChart, open the app, select [place], and log in with your MyChart username and password.

## 2024-04-08 NOTE — Progress Notes (Signed)
 SPIRITUAL CARE AND COUNSELING CONSULT NOTE   VISIT SUMMARY   Reason for Visit: Chaplain making scheduled follow-up with Pt I previously connected with.  Description of Visit: I entered the room and found Ollie sitting in the chair receiving his treatment, with no one there as a support person.    Mayjor immediatly remembered me and was open to my offer of support.  We explored his treatment and some small success since our last visit.  He reports feeling some decrease in the size of a tumor.  He is still experiencing pain in his leg/hip, but is optimistic that radiation will bring that relief soon.   Pt reports that since our last visit he has left his father's house where he was a main caregiver, and has moved in with his mother/step-father.  Jackston states that this move has brought him peace and calm- his father, who is suffering with dementia, is often aggressive toward him and it was a stressful environment.  Doss states that his step-father is very supportive and helpful in getting Pt to and from his many appointments.   Ismaeel talks a lot about his faith, stating over and over that God is in control and that he (the Pt) will accept whatever God decides.  He says this in over and over leading me to suspect that this is what he WANTS to feel, but is still in the process of wrestling to feel that way.  I will attempt to explore deeper in future visits.  Plan of Care: I will continue to follow Lynwood on a bi-weekly basis.   SPIRITUAL ENCOUNTER                                                                                                                                                                      Type of Visit: Follow up Care provided to:: Patient Referral source: Chaplain assessment Reason for visit: Routine spiritual support OnCall Visit: No   SPIRITUAL FRAMEWORK  Presenting Themes: Meaning/purpose/sources of inspiration, Goals in life/care, Values and beliefs, Coping tools,  Caregiving needs Values/beliefs: yashar inclan faith; he values peace and has made changes in his home life to move away from choas and pressure toward a more calming and supportive environment Community/Connection: Family Strengths: Spirituality, Hope, and perseverance Needs/Challenges/Barriers: Right now he continues to expereince pain in his hip and leg from tumor Patient Stress Factors: Family relationships (His father has increasing dementia and is aggressinve toward him) Family Stress Factors: Health changes, Loss of control   GOALS   Self/Personal Goals: To survive and return to a normal life Clinical Care Goals: Build relationship of care and support   INTERVENTIONS   Spiritual Care Interventions Made: Established relationship of care and support, Compassionate presence,  Reflective listening, Narrative/life review, Explored values/beliefs/practices/strengths, Prayer    INTERVENTION OUTCOMES   Outcomes: Connection to spiritual care, Reduced anxiety, Awareness of support  SPIRITUAL CARE PLAN   Spiritual Care Issues Still Outstanding: Chaplain will continue to follow   Maude Roll, MDiv Chaplain, [email or web address] [phone] 04/08/2024 11:40 AM

## 2024-04-09 ENCOUNTER — Other Ambulatory Visit: Payer: Self-pay

## 2024-04-09 ENCOUNTER — Ambulatory Visit
Admission: RE | Admit: 2024-04-09 | Discharge: 2024-04-09 | Disposition: A | Source: Ambulatory Visit | Attending: Radiation Oncology

## 2024-04-09 DIAGNOSIS — C7951 Secondary malignant neoplasm of bone: Secondary | ICD-10-CM | POA: Diagnosis not present

## 2024-04-09 LAB — RAD ONC ARIA SESSION SUMMARY
Course Elapsed Days: 4
Plan Fractions Treated to Date: 4
Plan Fractions Treated to Date: 4
Plan Prescribed Dose Per Fraction: 3 Gy
Plan Prescribed Dose Per Fraction: 3 Gy
Plan Total Fractions Prescribed: 10
Plan Total Fractions Prescribed: 10
Plan Total Prescribed Dose: 30 Gy
Plan Total Prescribed Dose: 30 Gy
Reference Point Dosage Given to Date: 12 Gy
Reference Point Dosage Given to Date: 12 Gy
Reference Point Session Dosage Given: 3 Gy
Reference Point Session Dosage Given: 3 Gy
Session Number: 4

## 2024-04-09 LAB — T4: T4, Total: 7.8 ug/dL (ref 4.5–12.0)

## 2024-04-09 NOTE — Progress Notes (Signed)
 24 hour call back,  No answer.  No voicemail to leave message

## 2024-04-12 ENCOUNTER — Inpatient Hospital Stay: Payer: Self-pay | Admitting: Dietician

## 2024-04-12 ENCOUNTER — Inpatient Hospital Stay

## 2024-04-12 ENCOUNTER — Other Ambulatory Visit: Payer: Self-pay | Admitting: *Deleted

## 2024-04-12 ENCOUNTER — Telehealth: Payer: Self-pay | Admitting: *Deleted

## 2024-04-12 ENCOUNTER — Ambulatory Visit
Admission: RE | Admit: 2024-04-12 | Discharge: 2024-04-12 | Disposition: A | Source: Ambulatory Visit | Attending: Radiation Oncology

## 2024-04-12 ENCOUNTER — Other Ambulatory Visit: Payer: Self-pay

## 2024-04-12 ENCOUNTER — Ambulatory Visit: Admission: RE | Admit: 2024-04-12 | Discharge: 2024-04-12 | Attending: Radiation Oncology

## 2024-04-12 VITALS — BP 106/72 | HR 77 | Temp 97.4°F | Resp 20

## 2024-04-12 DIAGNOSIS — Z5111 Encounter for antineoplastic chemotherapy: Secondary | ICD-10-CM | POA: Diagnosis not present

## 2024-04-12 DIAGNOSIS — C3492 Malignant neoplasm of unspecified part of left bronchus or lung: Secondary | ICD-10-CM

## 2024-04-12 DIAGNOSIS — C7951 Secondary malignant neoplasm of bone: Secondary | ICD-10-CM | POA: Diagnosis not present

## 2024-04-12 DIAGNOSIS — R918 Other nonspecific abnormal finding of lung field: Secondary | ICD-10-CM

## 2024-04-12 LAB — CBC WITH DIFFERENTIAL/PLATELET
Abs Immature Granulocytes: 0.03 K/uL (ref 0.00–0.07)
Basophils Absolute: 0 K/uL (ref 0.0–0.1)
Basophils Relative: 1 %
Eosinophils Absolute: 0.1 K/uL (ref 0.0–0.5)
Eosinophils Relative: 2 %
HCT: 34.4 % — ABNORMAL LOW (ref 39.0–52.0)
Hemoglobin: 11.6 g/dL — ABNORMAL LOW (ref 13.0–17.0)
Immature Granulocytes: 0 %
Lymphocytes Relative: 8 %
Lymphs Abs: 0.6 K/uL — ABNORMAL LOW (ref 0.7–4.0)
MCH: 28.6 pg (ref 26.0–34.0)
MCHC: 33.7 g/dL (ref 30.0–36.0)
MCV: 84.7 fL (ref 80.0–100.0)
Monocytes Absolute: 0.2 K/uL (ref 0.1–1.0)
Monocytes Relative: 2 %
Neutro Abs: 6.4 K/uL (ref 1.7–7.7)
Neutrophils Relative %: 87 %
Platelets: 297 K/uL (ref 150–400)
RBC: 4.06 MIL/uL — ABNORMAL LOW (ref 4.22–5.81)
RDW: 13.5 % (ref 11.5–15.5)
WBC: 7.4 K/uL (ref 4.0–10.5)
nRBC: 0 % (ref 0.0–0.2)

## 2024-04-12 LAB — RAD ONC ARIA SESSION SUMMARY
Course Elapsed Days: 7
Plan Fractions Treated to Date: 5
Plan Fractions Treated to Date: 5
Plan Prescribed Dose Per Fraction: 3 Gy
Plan Prescribed Dose Per Fraction: 3 Gy
Plan Total Fractions Prescribed: 10
Plan Total Fractions Prescribed: 10
Plan Total Prescribed Dose: 30 Gy
Plan Total Prescribed Dose: 30 Gy
Reference Point Dosage Given to Date: 15 Gy
Reference Point Dosage Given to Date: 15 Gy
Reference Point Session Dosage Given: 3 Gy
Reference Point Session Dosage Given: 3 Gy
Session Number: 5

## 2024-04-12 LAB — COMPREHENSIVE METABOLIC PANEL WITH GFR
ALT: 43 U/L (ref 0–44)
AST: 30 U/L (ref 15–41)
Albumin: 3.5 g/dL (ref 3.5–5.0)
Alkaline Phosphatase: 142 U/L — ABNORMAL HIGH (ref 38–126)
Anion gap: 9 (ref 5–15)
BUN: 18 mg/dL (ref 6–20)
CO2: 29 mmol/L (ref 22–32)
Calcium: 8.9 mg/dL (ref 8.9–10.3)
Chloride: 99 mmol/L (ref 98–111)
Creatinine, Ser: 0.58 mg/dL — ABNORMAL LOW (ref 0.61–1.24)
GFR, Estimated: >60 mL/min
Glucose, Bld: 97 mg/dL (ref 70–99)
Potassium: 3.8 mmol/L (ref 3.5–5.1)
Sodium: 137 mmol/L (ref 135–145)
Total Bilirubin: 0.4 mg/dL (ref 0.0–1.2)
Total Protein: 6.6 g/dL (ref 6.5–8.1)

## 2024-04-12 LAB — MAGNESIUM: Magnesium: 1.9 mg/dL (ref 1.7–2.4)

## 2024-04-12 MED ORDER — CHLORPROMAZINE HCL 10 MG PO TABS: 10.0000 mg | ORAL_TABLET | Freq: Four times a day (QID) | ORAL | 3 refills | Status: AC | PRN

## 2024-04-12 MED ORDER — OXYCODONE HCL 5 MG PO TABS: 10.0000 mg | ORAL_TABLET | Freq: Four times a day (QID) | ORAL | 0 refills | Status: DC | PRN

## 2024-04-12 MED ORDER — SODIUM CHLORIDE 0.9 % IV SOLN: INTRAVENOUS | Status: DC

## 2024-04-12 NOTE — Progress Notes (Signed)
 Patient c/o intractable hiccups.  Per standing order Thorazine  10 mg tablets Q 6 hours PRN #30 with 3 refills sent to patient's preferred pharmacy and is aware of how to take.

## 2024-04-12 NOTE — Patient Instructions (Signed)
 [facility] [place] - A DEPT OF [facility]  Discharge Instructions: Thank you for choosing [facility] to provide your oncology and hematology care.  If you have a lab appointment with the Cancer Center - please note that after April 8th, 2024, all labs will be drawn in the cancer center.  You do not have to check in or register with the main entrance as you have in the past but will complete your check-in in the cancer center.  Wear comfortable clothing and clothing appropriate for easy access to any Portacath or PICC line.   We strive to give you quality time with your provider. You may need to reschedule your appointment if you arrive late (15 or more minutes).  Arriving late affects you and other patients whose appointments are after yours.  Also, if you miss three or more appointments without notifying the office, you may be dismissed from the clinic at the providers discretion.      For prescription refill requests, have your pharmacy contact our office and allow 72 hours for refills to be completed.    Today you received the following IV fluids of normal saline.  Take one 4 mg tablet twice daily the day before pemetrexed . Then take two tablets (8 mg) daily starting the day after carboplatin  x 3 days    To help prevent nausea and vomiting after your treatment, we encourage you to take your nausea medication as directed.  BELOW ARE SYMPTOMS THAT SHOULD BE REPORTED IMMEDIATELY: *FEVER GREATER THAN 100.4 F (38 C) OR HIGHER *CHILLS OR SWEATING *NAUSEA AND VOMITING THAT IS NOT CONTROLLED WITH YOUR NAUSEA MEDICATION *UNUSUAL SHORTNESS OF BREATH *UNUSUAL BRUISING OR BLEEDING *URINARY PROBLEMS (pain or burning when urinating, or frequent urination) *BOWEL PROBLEMS (unusual diarrhea, constipation, pain near the anus) TENDERNESS IN MOUTH AND THROAT WITH OR WITHOUT PRESENCE OF ULCERS (sore throat, sores in mouth, or a toothache) UNUSUAL RASH, SWELLING OR  PAIN  UNUSUAL VAGINAL DISCHARGE OR ITCHING   Items with * indicate a potential emergency and should be followed up as soon as possible or go to the Emergency Department if any problems should occur.  Please show the CHEMOTHERAPY ALERT CARD or IMMUNOTHERAPY ALERT CARD at check-in to the Emergency Department and triage nurse.  Should you have questions after your visit or need to cancel or reschedule your appointment, please contact [facility] [place] - A DEPT OF [facility] [phone]  and follow the prompts.  Office hours are 8:00 a.m. to 4:30 p.m. Monday - Friday. Please note that voicemails left after 4:00 p.m. may not be returned until the following business day.  We are closed weekends and major holidays. You have access to a nurse at all times for urgent questions. Please call the main number to the clinic [phone] and follow the prompts.  For any non-urgent questions, you may also contact your provider using MyChart. We now offer e-Visits for anyone 27 and older to request care online for non-urgent symptoms. For details visit [email or web address].   Also download the MyChart app! Go to the app store, search MyChart, open the app, select [place], and log in with your MyChart username and password.

## 2024-04-12 NOTE — Progress Notes (Signed)
 Patients port flushed without difficulty.  No bruising or swelling noted at site. Unable to get blood return. Patient denies any pain. Port assessed by Leotis Nasuti RN. Patient to get NS infusion and recheck for blood return after.

## 2024-04-12 NOTE — Progress Notes (Signed)
 Patient presents today for fluids per T.Myers RN . Patient called the triage line. Patient has complaints of fatigue not relieved with rest. Patient denies nausea, vomiting, diarrhea and is able to eat and drink . Patient teaching performed. Patient educated on steroid administration by RN in detail.   1 liter of normal saline given today per MD orders. Tolerated infusion without adverse affects. Vital signs stable. No complaints at this time. Discharged from clinic ambulatory in stable condition. Alert and oriented x 3. F/U with [facility] as scheduled.

## 2024-04-12 NOTE — Telephone Encounter (Signed)
 "  NURSING SYMPTOM TRIAGE ASSESSMENT & DISPOSITION  Mode of interaction:  Telephone Date of symptom triage interaction:  04/12/2024 Time of symptom triage interaction:  10:00AM  Cancer diagnosis:  No diagnosis found. Patient is on Treatment Plan :  LUNG NSCLC NON-SQUAMOUS Nivolumab  + Ipilimumab  + Carboplatin  + Pemetrexed  q42d X 1 cycle / Maintenance Nivolumab  + Ipilimumab  q42d  Last treatment date:  04/08/2024 Oral chemotherapy:  No  Symptom Triage Protocol:  Fatigue  History of the problem:  Fatigue Severity: 0-10 scale with 0 as not severe and 10 as unimaginably severe 10 = Worst imaginable  Onset: 04/08/2024  Duration: Ongoing since treatment  Relieving factors: None  Intensifying factors: Activity  Changes in ADLs: Difficulty having energy to complete tasks  Associated symptoms: Sleep disturbances, Decreased appetite     Additional commentary: Patient states he did not take decadron  after treatment on Thursday and has been weak and has had little intake.  Denies fever, diarrhea, nausea or vomiting.   Triage Nurse Guidance Signs and Symptoms Action  Unable to wake up Seek emergency care. Call an ambulance immediately.  Severe fatigue that is disabling; patient is bedridden. Temperature higher than 100.50F (38C) with suspected neutropenia Adverse reaction to psychostimulant (e.g., methylpheni-date, modafinil, prednisone , dexamethasone ) Seek emergency care.   Severe fatigue or loss of ability to perform some activities Dizziness Temperature higher than 100.50F (38C) without sus-pected neutropenia Schedule office visit in 24-48 hours.   Moderate fatigue or difficulty performing some activities of daily living Follow homecare instruc-tions. Notify MD if no improvement.  Increased fatigue over baseline but not altering daily life-style Follow homecare instruc-tions. Notify MD if no improvement.   Provider Consulted  Provider name and credentials: Mickiel Dry, MD  Provider  instruction: Will bring patient in for labs and fluids today   Patient Instruction  Disclaimer:  Patient specific and [employer] instruction provided verbally and sent through MyChart for active users.    Report the Following Problems: Blood in urine or stool Weight loss Fever (temperature above the normal or baseline temperature) without neutropenia Inability to perform activities of daily living Inability to conceptualize thoughts  Seek Emergency Care Immediately if Any of the Following Occurs: Fainting Unconsciousness Temperature higher than 100.50F (38C) with suspected neutropenia  Participate in the appropriate and optimal physical activity/exercise for the individual: Start slowly (e.g., walking, gentle stretching, yoga). Try short periods of exercise with frequent rest breaks. Avoid uneven surfaces. Avoid activity that puts you at risk for falls or injury. Aim for daily physical activity.  If able, include endurance activities (e.g., walking, swimming, cycling) and resis-tance training (e.g., weights, resistance bands).  Seek a physical therapist or exercise specialist for assessment and an exercise prescription.  Exercise is recommended with caution in patients with the following:  Bone metastases Thrombocytopenia Anemia Fever or active infection Physical limitations secondary to metastases or comorbid conditions Safety issues (risk of falls)  Manage stress through various means: In-person or online support groups Expressive writing, journaling Supportive counseling Relaxation breathing Progressive muscle relaxation Mindfulness-based stress reduction Meditation  Practice energy conservation: Prioritize important tasks and eliminate or delegate others. Keep frequently used items within reach.  Do not stand too long. Sit when preparing meals, washing dishes, ironing, etc.  Alternate periods of activity and rest throughout the day.  Plan ahead, as rushing uses  energy.  Keep an activity journal for a few weeks to identify patterns of energy and fatigue. Use adaptive devices such as a jar opener, reaching or grabbing tool, shower  chair, or bedside commode.  Optimize sleep quality: Cognitive behavioral therapy can address negative thought patterns and behav-iors that interfere with sleep. Sleep restriction: Match the time in bed with sleep requirements.  Avoid caffeine at least six to eight hours before bedtime.  Stop smoking.  Avoid alcohol and high-sugar foods in the evening.  Use the bedroom only for sleep and sex.  Limit daytime naps to 30 minutes.  Establish a routine prior to sleep that promotes relaxation.  Create a conducive sleep environment (dark, quiet, and comfortable).  Avoid gaming, watching TV, and using a computer or cell phone late at night.  Encourage a balanced diet and adequate intake of fluids, electrolytes, calories, protein, carbohydrates, fat, vitamins, and minerals.  Teach Back Method used:  Yes   Nurse Triage Priority:  Urgent (obtain medical orders as indicated with instruction for 8-24 hour or sooner follow-up)  Barriers to Care:  None identified  Nurse Triage Disposition:  In-person follow-up visit:  04/12/2024  Protocol Source:  Ruthine HERO., & Eldonna CANDIE Pee.). (2019). Telephone triage for oncology nurses (3rd ed.). Oncology Nursing Society.  "

## 2024-04-12 NOTE — Progress Notes (Unsigned)
 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 Patient receiving carboplatin  + alimta  q21d. Pending start of palliative radiation to painful bone lesions. Patient under the care of Dr. Davonna      Medications: reviewed   Labs:   Anthropometrics: Last wt 130 lb on 1/8  12/29 - 134 lb 7.7 oz 12/8 - 140 lb  11/4 - 142 lb 3.2 oz    NUTRITION DIAGNOSIS: Unintended wt loss - ongoing       INTERVENTION: ***    MONITORING, EVALUATION, GOAL: wt trends, intake    NEXT VISIT: ***

## 2024-04-13 ENCOUNTER — Other Ambulatory Visit: Payer: Self-pay

## 2024-04-13 ENCOUNTER — Ambulatory Visit: Admission: RE | Admit: 2024-04-13 | Discharge: 2024-04-13 | Attending: Radiation Oncology

## 2024-04-13 DIAGNOSIS — C7951 Secondary malignant neoplasm of bone: Secondary | ICD-10-CM | POA: Diagnosis not present

## 2024-04-13 LAB — RAD ONC ARIA SESSION SUMMARY
Course Elapsed Days: 8
Plan Fractions Treated to Date: 6
Plan Fractions Treated to Date: 6
Plan Prescribed Dose Per Fraction: 3 Gy
Plan Prescribed Dose Per Fraction: 3 Gy
Plan Total Fractions Prescribed: 10
Plan Total Fractions Prescribed: 10
Plan Total Prescribed Dose: 30 Gy
Plan Total Prescribed Dose: 30 Gy
Reference Point Dosage Given to Date: 18 Gy
Reference Point Dosage Given to Date: 18 Gy
Reference Point Session Dosage Given: 3 Gy
Reference Point Session Dosage Given: 3 Gy
Session Number: 6

## 2024-04-14 ENCOUNTER — Other Ambulatory Visit: Payer: Self-pay

## 2024-04-14 ENCOUNTER — Ambulatory Visit
Admission: RE | Admit: 2024-04-14 | Discharge: 2024-04-14 | Disposition: A | Discharge status: Home / Self Care | Source: Ambulatory Visit | Attending: Radiation Oncology | Admitting: Radiation Oncology

## 2024-04-14 DIAGNOSIS — C7951 Secondary malignant neoplasm of bone: Secondary | ICD-10-CM | POA: Diagnosis not present

## 2024-04-14 LAB — RAD ONC ARIA SESSION SUMMARY
Course Elapsed Days: 9
Plan Fractions Treated to Date: 7
Plan Fractions Treated to Date: 7
Plan Prescribed Dose Per Fraction: 3 Gy
Plan Prescribed Dose Per Fraction: 3 Gy
Plan Total Fractions Prescribed: 10
Plan Total Fractions Prescribed: 10
Plan Total Prescribed Dose: 30 Gy
Plan Total Prescribed Dose: 30 Gy
Reference Point Dosage Given to Date: 21 Gy
Reference Point Dosage Given to Date: 21 Gy
Reference Point Session Dosage Given: 3 Gy
Reference Point Session Dosage Given: 3 Gy
Session Number: 7

## 2024-04-15 ENCOUNTER — Encounter: Payer: Self-pay | Admitting: Oncology

## 2024-04-15 ENCOUNTER — Other Ambulatory Visit (HOSPITAL_COMMUNITY): Payer: Self-pay

## 2024-04-15 ENCOUNTER — Telehealth: Payer: Self-pay | Admitting: Pharmacy Technician

## 2024-04-15 ENCOUNTER — Other Ambulatory Visit: Payer: Self-pay

## 2024-04-15 ENCOUNTER — Ambulatory Visit
Admission: RE | Admit: 2024-04-15 | Discharge: 2024-04-15 | Disposition: A | Source: Ambulatory Visit | Attending: Radiation Oncology

## 2024-04-15 DIAGNOSIS — C7951 Secondary malignant neoplasm of bone: Secondary | ICD-10-CM | POA: Diagnosis not present

## 2024-04-15 LAB — RAD ONC ARIA SESSION SUMMARY
Course Elapsed Days: 10
Plan Fractions Treated to Date: 8
Plan Fractions Treated to Date: 8
Plan Prescribed Dose Per Fraction: 3 Gy
Plan Prescribed Dose Per Fraction: 3 Gy
Plan Total Fractions Prescribed: 10
Plan Total Fractions Prescribed: 10
Plan Total Prescribed Dose: 30 Gy
Plan Total Prescribed Dose: 30 Gy
Reference Point Dosage Given to Date: 24 Gy
Reference Point Dosage Given to Date: 24 Gy
Reference Point Session Dosage Given: 3 Gy
Reference Point Session Dosage Given: 3 Gy
Session Number: 8

## 2024-04-15 NOTE — Telephone Encounter (Signed)
 Oral Oncology Patient Advocate Encounter   New authorization   Received notification that prior authorization for oxyCODONE  (OXY IR/ROXICODONE ) 5 MG immediate release tablet is required.   PA submitted on CMM via Lat[employer] [id number] Status is pending     Jeremy Hancock (Patty) Chet Burnet, CPhT  [facility], [name], [facility] - Oral Chemotherapy Patient Advocate Specialist III Phone: [phone]  Fax: [phone]

## 2024-04-15 NOTE — Telephone Encounter (Signed)
 Oral Oncology Patient Advocate Encounter  Prior Authorization for oxyCODONE  (OXY IR/ROXICODONE ) 5 MG immediate release tablet has been approved.    PA# [id number] Effective dates: 04/15/2024 through 04/15/2025  Barbette (Patty) Chet Burnet, CPhT  [facility], [name], [place] Hematology/Oncology - Oral Chemotherapy Patient Advocate Specialist III Phone: [phone]  Fax: [phone]

## 2024-04-16 ENCOUNTER — Ambulatory Visit

## 2024-04-16 ENCOUNTER — Other Ambulatory Visit: Payer: Self-pay

## 2024-04-16 ENCOUNTER — Ambulatory Visit
Admission: RE | Admit: 2024-04-16 | Discharge: 2024-04-16 | Disposition: A | Discharge status: Home / Self Care | Source: Ambulatory Visit | Attending: Radiation Oncology | Admitting: Radiation Oncology

## 2024-04-16 DIAGNOSIS — C7951 Secondary malignant neoplasm of bone: Secondary | ICD-10-CM | POA: Diagnosis not present

## 2024-04-16 LAB — RAD ONC ARIA SESSION SUMMARY
Course Elapsed Days: 11
Plan Fractions Treated to Date: 9
Plan Fractions Treated to Date: 9
Plan Prescribed Dose Per Fraction: 3 Gy
Plan Prescribed Dose Per Fraction: 3 Gy
Plan Total Fractions Prescribed: 10
Plan Total Fractions Prescribed: 10
Plan Total Prescribed Dose: 30 Gy
Plan Total Prescribed Dose: 30 Gy
Reference Point Dosage Given to Date: 27 Gy
Reference Point Dosage Given to Date: 27 Gy
Reference Point Session Dosage Given: 3 Gy
Reference Point Session Dosage Given: 3 Gy
Session Number: 9

## 2024-04-19 ENCOUNTER — Ambulatory Visit

## 2024-04-19 ENCOUNTER — Other Ambulatory Visit: Payer: Self-pay

## 2024-04-19 ENCOUNTER — Ambulatory Visit
Admission: RE | Admit: 2024-04-19 | Discharge: 2024-04-19 | Disposition: A | Source: Ambulatory Visit | Attending: Radiation Oncology

## 2024-04-19 DIAGNOSIS — C7951 Secondary malignant neoplasm of bone: Secondary | ICD-10-CM | POA: Diagnosis not present

## 2024-04-19 LAB — RAD ONC ARIA SESSION SUMMARY
Course Elapsed Days: 14
Plan Fractions Treated to Date: 10
Plan Fractions Treated to Date: 10
Plan Prescribed Dose Per Fraction: 3 Gy
Plan Prescribed Dose Per Fraction: 3 Gy
Plan Total Fractions Prescribed: 10
Plan Total Fractions Prescribed: 10
Plan Total Prescribed Dose: 30 Gy
Plan Total Prescribed Dose: 30 Gy
Reference Point Dosage Given to Date: 30 Gy
Reference Point Dosage Given to Date: 30 Gy
Reference Point Session Dosage Given: 3 Gy
Reference Point Session Dosage Given: 3 Gy
Session Number: 10

## 2024-04-21 ENCOUNTER — Other Ambulatory Visit (HOSPITAL_COMMUNITY): Payer: Self-pay

## 2024-04-21 ENCOUNTER — Encounter: Payer: Self-pay | Admitting: Gastroenterology
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
## 2024-04-21 NOTE — Radiation Completion Notes (Signed)
 Patient Name: Jeremy Hancock, Jeremy Hancock MRN: [id number] Date of Birth: 1970-11-15 Referring Physician: MICKIEL DRY, M.D. Date of Service: 2024-04-21 Radiation Oncologist: Lauraine Golden, M.D. [facility] [place]                             RADIATION ONCOLOGY END OF TREATMENT NOTE     Diagnosis: C79.51 Secondary malignant neoplasm of bone Intent: Palliative     ==========DELIVERED PLANS==========  First Treatment Date: 2024-04-05 Last Treatment Date: 2024-04-19   Plan Name: Chest_L_Scap Site: Scapula, Left Technique: 3D Mode: Photon Dose Per Fraction: 3 Gy Prescribed Dose (Delivered / Prescribed): 30 Gy / 30 Gy Prescribed Fxs (Delivered / Prescribed): 10 / 10   Plan Name: Pelvis_L Site: Pelvis Technique: 3D Mode: Photon Dose Per Fraction: 3 Gy Prescribed Dose (Delivered / Prescribed): 30 Gy / 30 Gy Prescribed Fxs (Delivered / Prescribed): 10 / 10     ==========ON TREATMENT VISIT DATES========== 2024-04-05, 2024-04-12, 2024-04-19     ==========UPCOMING VISITS========== 05/20/2024 [facility] INFUSION 2HR (120) [facility] CHAIR 1  05/20/2024 [facility] OFFICE VISIT 20 Dry Mickiel, MD  05/20/2024 [facility] PORT FLUSH W/LAB [facility] NURSE  04/29/2024 [facility] INFUSION 2HR15MIN (135) [facility] CHAIR 1  04/29/2024 [facility] OFFICE VISIT 20 Dry Mickiel, MD  04/29/2024 [facility] PORT FLUSH W/LAB [facility] NURSE  04/22/2024 [facility] NEW PATIENT 30 [place], Charmaine CROME, NP        ==========APPENDIX - ON TREATMENT VISIT NOTES==========   See weekly On Treatment Notes in Epic for details in the Media tab (listed as Progress notes on the On Treatment Visit Dates listed above).

## 2024-04-22 ENCOUNTER — Other Ambulatory Visit: Payer: Self-pay | Admitting: *Deleted

## 2024-04-22 ENCOUNTER — Encounter: Payer: Self-pay | Admitting: *Deleted

## 2024-04-22 ENCOUNTER — Telehealth: Payer: Self-pay | Admitting: Gastroenterology

## 2024-04-22 ENCOUNTER — Ambulatory Visit (INDEPENDENT_AMBULATORY_CARE_PROVIDER_SITE_OTHER): Admitting: Gastroenterology

## 2024-04-22 ENCOUNTER — Encounter: Payer: Self-pay | Admitting: Gastroenterology

## 2024-04-22 VITALS — BP 110/73 | HR 85 | Temp 98.0°F | Ht 65.0 in | Wt 122.2 lb

## 2024-04-22 DIAGNOSIS — C3492 Malignant neoplasm of unspecified part of left bronchus or lung: Secondary | ICD-10-CM

## 2024-04-22 DIAGNOSIS — C7951 Secondary malignant neoplasm of bone: Secondary | ICD-10-CM | POA: Diagnosis not present

## 2024-04-22 DIAGNOSIS — K6289 Other specified diseases of anus and rectum: Secondary | ICD-10-CM | POA: Diagnosis not present

## 2024-04-22 DIAGNOSIS — R948 Abnormal results of function studies of other organs and systems: Secondary | ICD-10-CM

## 2024-04-22 DIAGNOSIS — R634 Abnormal weight loss: Secondary | ICD-10-CM

## 2024-04-22 DIAGNOSIS — E46 Unspecified protein-calorie malnutrition: Secondary | ICD-10-CM | POA: Diagnosis not present

## 2024-04-22 DIAGNOSIS — T402X5A Adverse effect of other opioids, initial encounter: Secondary | ICD-10-CM

## 2024-04-22 DIAGNOSIS — K5903 Drug induced constipation: Secondary | ICD-10-CM

## 2024-04-22 DIAGNOSIS — Z800 Family history of malignant neoplasm of digestive organs: Secondary | ICD-10-CM | POA: Diagnosis not present

## 2024-04-22 MED ORDER — PEG 3350-KCL-NA BICARB-NACL 420 G PO SOLR: 4000.0000 mL | Freq: Once | ORAL | 0 refills | Status: AC

## 2024-04-22 NOTE — Telephone Encounter (Signed)
 Please request prior colonoscopy and pathology records from [facility].

## 2024-04-22 NOTE — Telephone Encounter (Signed)
 Request has been sent to request re ports from [facility]

## 2024-04-22 NOTE — Patient Instructions (Signed)
 For your constipation which you to continue taking 2 stool softeners daily and senna at nighttime.  If you start noticing it is harder to go your stools are more firm or you are having to strain or having worsening abdominal pain please let me know so I can give you further instructions for bowel regimen.  Initially if you start noticing it harder to go or stools were more firm then please start taking 2 senna tablets nightly instead of 1.  Also add MiraLAX  more regularly if symptoms are worsened.  We will get you scheduled for colonoscopy soon as possible, we will reach out to Dr. Davonna in regards to timing of your colonoscopy around your chemo treatments.  As soon as we hear from her we will reach out to you.  It was a pleasure to see you today. I want to create trusting relationships with patients. If you receive a survey regarding your visit,  I greatly appreciate you taking time to fill this out on paper or through your MyChart. I value your feedback.  Charmaine Melia, MSN, FNP-BC, AGACNP-BC [facility]

## 2024-04-26 ENCOUNTER — Inpatient Hospital Stay: Payer: Self-pay | Admitting: Oncology

## 2024-04-26 ENCOUNTER — Inpatient Hospital Stay: Payer: Self-pay

## 2024-04-28 ENCOUNTER — Other Ambulatory Visit (HOSPITAL_COMMUNITY): Payer: Self-pay

## 2024-04-28 ENCOUNTER — Other Ambulatory Visit: Payer: Self-pay

## 2024-04-29 ENCOUNTER — Inpatient Hospital Stay: Payer: Self-pay

## 2024-04-29 ENCOUNTER — Inpatient Hospital Stay: Payer: Self-pay | Admitting: Oncology

## 2024-04-29 ENCOUNTER — Telehealth: Payer: Self-pay | Admitting: Cardiovascular Disease

## 2024-04-29 VITALS — BP 104/75 | HR 64 | Temp 97.6°F | Resp 16

## 2024-04-29 DIAGNOSIS — K5900 Constipation, unspecified: Secondary | ICD-10-CM | POA: Diagnosis not present

## 2024-04-29 DIAGNOSIS — C3492 Malignant neoplasm of unspecified part of left bronchus or lung: Secondary | ICD-10-CM

## 2024-04-29 DIAGNOSIS — K629 Disease of anus and rectum, unspecified: Secondary | ICD-10-CM | POA: Diagnosis not present

## 2024-04-29 DIAGNOSIS — Z5111 Encounter for antineoplastic chemotherapy: Secondary | ICD-10-CM | POA: Diagnosis not present

## 2024-04-29 DIAGNOSIS — R748 Abnormal levels of other serum enzymes: Secondary | ICD-10-CM | POA: Diagnosis not present

## 2024-04-29 DIAGNOSIS — G893 Neoplasm related pain (acute) (chronic): Secondary | ICD-10-CM | POA: Diagnosis not present

## 2024-04-29 LAB — COMPREHENSIVE METABOLIC PANEL WITH GFR
ALT: 78 U/L — ABNORMAL HIGH (ref 0–44)
AST: 48 U/L — ABNORMAL HIGH (ref 15–41)
Albumin: 4.1 g/dL (ref 3.5–5.0)
Alkaline Phosphatase: 141 U/L — ABNORMAL HIGH (ref 38–126)
Anion gap: 16 — ABNORMAL HIGH (ref 5–15)
BUN: 10 mg/dL (ref 6–20)
CO2: 21 mmol/L — ABNORMAL LOW (ref 22–32)
Calcium: 9.7 mg/dL (ref 8.9–10.3)
Chloride: 103 mmol/L (ref 98–111)
Creatinine, Ser: 0.64 mg/dL (ref 0.61–1.24)
GFR, Estimated: >60 mL/min
Glucose, Bld: 154 mg/dL — ABNORMAL HIGH (ref 70–99)
Potassium: 3.5 mmol/L (ref 3.5–5.1)
Sodium: 140 mmol/L (ref 135–145)
Total Bilirubin: 0.2 mg/dL (ref 0.0–1.2)
Total Protein: 7.3 g/dL (ref 6.5–8.1)

## 2024-04-29 LAB — CBC WITH DIFFERENTIAL/PLATELET
Abs Immature Granulocytes: 0.07 10*3/uL (ref 0.00–0.07)
Basophils Absolute: 0 10*3/uL (ref 0.0–0.1)
Basophils Relative: 0 %
Eosinophils Absolute: 0 10*3/uL (ref 0.0–0.5)
Eosinophils Relative: 0 %
HCT: 34.2 % — ABNORMAL LOW (ref 39.0–52.0)
Hemoglobin: 11 g/dL — ABNORMAL LOW (ref 13.0–17.0)
Immature Granulocytes: 1 %
Lymphocytes Relative: 17 %
Lymphs Abs: 1.5 10*3/uL (ref 0.7–4.0)
MCH: 27.7 pg (ref 26.0–34.0)
MCHC: 32.2 g/dL (ref 30.0–36.0)
MCV: 86.1 fL (ref 80.0–100.0)
Monocytes Absolute: 0.5 10*3/uL (ref 0.1–1.0)
Monocytes Relative: 6 %
Neutro Abs: 6.5 10*3/uL (ref 1.7–7.7)
Neutrophils Relative %: 76 %
Platelets: 387 10*3/uL (ref 150–400)
RBC: 3.97 MIL/uL — ABNORMAL LOW (ref 4.22–5.81)
RDW: 15.9 % — ABNORMAL HIGH (ref 11.5–15.5)
WBC: 8.7 10*3/uL (ref 4.0–10.5)
nRBC: 0 % (ref 0.0–0.2)

## 2024-04-29 LAB — MAGNESIUM: Magnesium: 2 mg/dL (ref 1.7–2.4)

## 2024-04-29 MED ORDER — DEXAMETHASONE SOD PHOSPHATE PF 10 MG/ML IJ SOLN
10.0000 mg | Freq: Once | INTRAMUSCULAR | Status: AC
  Administered 2024-04-29: 10 mg via INTRAVENOUS
  Filled 2024-04-29: qty 1

## 2024-04-29 MED ORDER — SODIUM CHLORIDE 0.9 % IV SOLN
596.5000 mg | Freq: Once | INTRAVENOUS | Status: AC
  Administered 2024-04-29: 600 mg via INTRAVENOUS
  Filled 2024-04-29: qty 60

## 2024-04-29 MED ORDER — SODIUM CHLORIDE 0.9 % IV SOLN
500.0000 mg/m2 | Freq: Once | INTRAVENOUS | Status: AC
  Administered 2024-04-29: 800 mg via INTRAVENOUS
  Filled 2024-04-29: qty 20

## 2024-04-29 MED ORDER — SODIUM CHLORIDE 0.9 % IV SOLN
360.0000 mg | Freq: Once | INTRAVENOUS | Status: AC
  Administered 2024-04-29: 360 mg via INTRAVENOUS
  Filled 2024-04-29: qty 24

## 2024-04-29 MED ORDER — PALONOSETRON HCL INJECTION 0.25 MG/5ML
0.2500 mg | Freq: Once | INTRAVENOUS | Status: AC
  Administered 2024-04-29: 0.25 mg via INTRAVENOUS
  Filled 2024-04-29: qty 5

## 2024-04-29 MED ORDER — SODIUM CHLORIDE 0.9 % IV SOLN
150.0000 mg | Freq: Once | INTRAVENOUS | Status: AC
  Administered 2024-04-29: 150 mg via INTRAVENOUS
  Filled 2024-04-29: qty 150

## 2024-04-29 MED ORDER — SODIUM CHLORIDE 0.9 % IV SOLN: INTRAVENOUS | Status: DC

## 2024-04-29 NOTE — Progress Notes (Signed)
Labs reviewed, ok to treat per MD.   Treatment given per orders. Patient tolerated it well without problems. Vitals stable and discharged home from clinic ambulatory. Follow up as scheduled.  

## 2024-04-29 NOTE — Progress Notes (Signed)
 " Patient Care Team: Jeremy Butler DASEN, MD as PCP - General (Family Medicine) Jeremy Siad, MD as Medical Oncologist (Medical Oncology) Jeremy [name], RN as Oncology Nurse Navigator (Medical Oncology)  Clinic Day:  03/29/2024  Referring physician: Duanne Butler DASEN, MD   CHIEF COMPLAINT:  CC: Metastatic Non small cell lung carcinoma   ASSESSMENT & PLAN:   Assessment & Plan: Jeremy Hancock  is a 54 y.o. 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 Biopsy confirmed. PET scan shows metastatic spread.  MRI brain negative for brain metastasis Started on chemotherapy with carboplatin  and pemetrexed  on 03/10/2024. Added Ipilimumab  and nivolumab  on 04/08/2024 with cycle 2 [facility] NGS with no actionable mutations and PDL1 of 0%   -C3D1 today. Patient tolerated previous cycles well. -Labs reviewed today as below - Physical exam stable today. Will proceed with treatment today.  - Planned PET scan after four chemotherapy cycles to assess response. - Discussed that treatment would be with a palliative intent and not curative intent with continuing treatments as long as the patient tolerates it and has no disease progression. - Advised to report rash, dyspnea, diarrhea, or abdominal pain.  Return to clinic with next cycle to assess for response  Constipation due to opioid use and possible rectal mass Severe constipation likely from opioid use and possible rectal obstruction. Bowel regimen ineffective. Gastroenterology evaluation and colonoscopy pending. Planned for next week  -Constipation significantly improved.   Pain due to malignancy Pain in hip, shoulder, and posterior leg likely related to malignancy. Improved after palliative  radiation Not taking opioid daily at this time.   -Take opioids as needed  Elevated liver enzymes Mild elevation likely related to immunotherapy.  - Proceed with chemotherapy and immunotherapy today. - Monitor liver enzymes closely on subsequent labs.  Nicotine dependence, currently not smoking Abstinent from smoking, motivated to remain smoke-free.    The patient understands the plans discussed today and is in agreement with them.  He knows to contact our office if he develops concerns prior to his next appointment.  20 minutes of total time was spent for this patient encounter, including preparation,face-to-face counseling with the patient and coordination of care, physical exam, and documentation of the encounter.   Hancock Davonna, MD  [facility] [place] - A DEPT OF [facility] [address] [place] [address] Dept: [phone] Dept Fax: [phone]   No orders of the defined types were placed in this encounter.    ONCOLOGY HISTORY:   Diagnosis: Metastatic Non small cell lung carcinoma   -02/05/2024: Left shoulder x-ray: Soft tissue density in the region of the aortic knob with irregular contour, possible underlying mass. -02/14/2024: CTA CAP: Left apical paramediastinal mass with mediastinal invasion and abutment of the aortic arch, suspicious for a primary bronchogenic malignancy. Associated Pathologic left hilar adenopathy. Lytic osseous metastases involving the left scapula and left iliac wing, as well as Right gluteal subcutaneous soft tissue mass, suspicious for soft tissue metastasis. Despite the lesion noted within the rectum, these are favored to represent metastases related to a primary bronchogenic malignancy. Exophytic eccentric rectal mass, suspicious for a concurrent primary rectal malignancy. -02/14/2024: CT Head and Cervical spine: NED -02/17/2024: CEA: 46.0. Chromogranin A: Normal. CA 19-9:  Normal. -02/20/2024: Right gluteal mass biopsy.  Pathology: Metastatic carcinoma, consistent with lung primary. Tumor cells stain strongly and diffusely positively for TTF-1, and are negative for CDX2, GATA3, NKX3.1.  -02/22/2024: Initial PET: Hypermetabolic medial left upper lobe lung mass measuring 4.7 x 3.2 cm (SUV max 13.2), compatible with primary malignancy. Hypermetabolic nodal metastases in the AP window and a 13 mm left peribronchial node. Hypermetabolic rounded lesion anterior to the mid rectum compatible with malignancy. Multiple hypermetabolic osseous metastases with lytic/destructive lesions. Hypermetabolic soft tissue metastasis within the right flank superficial to the gluteus maximus muscle measuring 3.6 cm. -02/23/2024: MRI Brain: NED -03/10/2024- Current: Carboplatin  + Pemetrexed   -04/08/2024: Added Ipilimumab  and nivolumab  with cycle 2.  -03/17/2024: [facility] NGS: Negative for ALK, BRAF, EGFR, MET, RET, ROS1, MTRK 1/2/3, PDL1: TPS:0  - KRAS p.G12D: Positive -04/12/2024: Completed palliative radiation to left scapula and left pelvis  Current Treatment:  Carboplatin  + Pemetrexed  +Ipilimumab  + Nivolumab   INTERVAL HISTORY:   Discussed the use of AI scribe software for clinical note transcription with the patient, who gave verbal consent to proceed.  History of Present Illness Jeremy Hancock is a 54 year old male with stage IV non-small cell lung cancer presenting for follow-up during active chemotherapy and immunotherapy. He is accompanied by his father and mother today.  He is currently receiving cycle 3 of 4 of carboplatin  and pemetrexed  chemotherapy in combination with ipilimumab  and nivolumab  immunotherapy, initiated after PDL1 testing revealed 0%. He has not experienced new or worsening symptoms since the addition of immunotherapy, specifically denying rash and diarrhea.  Following recent palliative radiation to the left hip and shoulder, fatigue persists but is  gradually improving. He is now able to sleep and roll over on his hip, which he considers a significant improvement. Pain in the hip and shoulder has markedly improved, with decreased frequency of opioid analgesic use; he sometimes goes days without requiring medication, compared to prior use of one to two pills every six hours. Constipation, previously significant, has also improved in parallel with reduced opioid use. He continues to take pain medication only as needed.  Laboratory studies today show mildly elevated liver enzymes. He denies alcohol use and has no symptoms of hepatic dysfunction.  He is scheduled for colonoscopy next Friday following recent gastrointestinal evaluation, during which a palpable rectal mass was noted. He is currently taking steroids as part of his chemotherapy regimen, which do not worsen constipation and help with nausea. Appetite and oral intake have improved.  He remains abstinent from tobacco since his last visit and is motivated to continue smoking cessation.    I have reviewed the past medical history, past surgical history, social history and family history with the patient and they are unchanged from previous note.  ALLERGIES:  is allergic to amoxicillin , codeine, crestor [rosuvastatin], hydrocodone -acetaminophen , lipitor [atorvastatin], morphine and codeine, statins, zetia  [ezetimibe ], and zyrtec  [cetirizine ].  MEDICATIONS:  Current Outpatient Medications  Medication Sig Dispense Refill   chlorproMAZINE  (THORAZINE ) 10 MG tablet Take 1 tablet (10 mg total) by mouth every 6 (six) hours as needed. 30 tablet 3   dexamethasone  (DECADRON ) 4 MG tablet Take one 4 mg tablet twice daily the day before pemetrexed . Then take two tablets (8 mg) daily starting the day after carboplatin  x 3 days 8 tablet 1   docusate sodium  (COLACE) 100 MG capsule Take 1 capsule (100 mg total) by mouth 2 (two) times daily. 10 capsule 0   Evolocumab  (REPATHA  SURECLICK) 140 MG/ML SOAJ  Inject 140 mg into  the skin every 14 (fourteen) days. 6 mL 1   folic acid  (FOLVITE ) 1 MG tablet Take 1 tablet (1 mg total) by mouth daily. Start 7 days before pemetrexed  chemotherapy. Continue until 21 days after pemetrexed  completed. 100 tablet 3   GAVILYTE-N  WITH FLAVOR PACK 420 g solution Take by mouth.     ibuprofen  (ADVIL ) 800 MG tablet Take 800 mg by mouth every 8 (eight) hours as needed for moderate pain.     lidocaine -prilocaine  (EMLA ) cream Apply 1 Application topically as needed. 30 g 0   meloxicam  (MOBIC ) 15 MG tablet Take 1 tablet (15 mg total) by mouth daily. 90 tablet 2   Multiple Vitamin (MULTIVITAMIN) tablet Take 1 tablet by mouth daily.     ondansetron  (ZOFRAN ) 8 MG tablet Take 1 tablet (8 mg total) by mouth every 8 (eight) hours as needed for nausea or vomiting. Start on day 3 after carboplatin . 30 tablet 1   oxyCODONE  (OXY IR/ROXICODONE ) 5 MG immediate release tablet Take 2 tablets (10 mg total) by mouth every 6 (six) hours as needed for severe pain (pain score 7-10). 240 tablet 0   prochlorperazine  (COMPAZINE ) 10 MG tablet Take 1 tablet (10 mg total) by mouth every 6 (six) hours as needed for nausea or vomiting. 30 tablet 1   senna (SENOKOT) 8.6 MG TABS tablet Take 1 tablet (8.6 mg total) by mouth daily. 120 tablet 0   Evolocumab  (REPATHA  SURECLICK) 140 MG/ML SOAJ Inject 140 mg into the skin every 14 (fourteen) days. 6 mL 1   lidocaine -prilocaine  (EMLA ) cream Apply to affected area once 30 g 3   polyethylene glycol powder (GLYCOLAX /MIRALAX ) 17 GM/SCOOP powder Take 17 g by mouth daily. Dissolve 1 capful (17g) in 4-8 ounces of liquid and take by mouth daily.     No current facility-administered medications for this visit.     VITALS:  There were no vitals taken for this visit.  Wt Readings from Last 3 Encounters:  04/29/24 123 lb 14.4 oz (56.2 kg)  04/22/24 122 lb 3.2 oz (55.4 kg)  04/08/24 130 lb (59 kg)    There is no height or weight on file to calculate  BMI.  Performance status (ECOG): 0 - Asymptomatic  PHYSICAL EXAM:   GENERAL:alert, no distress and comfortable SKIN: skin color, texture, turgor are normal, no rashes or significant lesions LYMPH:  no palpable lymphadenopathy in the cervical, axillary or inguinal LUNGS: clear to auscultation and percussion with normal breathing effort HEART: regular rate & rhythm and no murmurs and no lower extremity edema ABDOMEN:abdomen soft, non-tender and normal bowel sounds Musculoskeletal:no cyanosis of digits and no clubbing  NEURO: alert & oriented x 3 with fluent speech  LABORATORY DATA:  I have reviewed the data as listed     Component Value Date/Time   NA 140 04/29/2024 0849   NA 140 04/05/2013 1943   K 3.5 04/29/2024 0849   K 3.7 04/05/2013 1943   CL 103 04/29/2024 0849   CL 106 04/05/2013 1943   CO2 21 (L) 04/29/2024 0849   CO2 30 04/05/2013 1943   GLUCOSE 154 (H) 04/29/2024 0849   GLUCOSE 84 04/05/2013 1943   BUN 10 04/29/2024 0849   BUN 8 04/05/2013 1943   CREATININE 0.64 04/29/2024 0849   CREATININE 0.87 02/03/2024 0906   CALCIUM 9.7 04/29/2024 0849   CALCIUM 8.9 04/05/2013 1943   PROT 7.3 04/29/2024 0849   PROT 6.9 01/07/2024 0815   PROT 7.1 04/05/2013 1943   ALBUMIN 4.1 04/29/2024 0849  ALBUMIN 4.3 01/07/2024 0815   ALBUMIN 3.7 04/05/2013 1943   AST 48 (H) 04/29/2024 0849   AST 28 04/05/2013 1943   ALT 78 (H) 04/29/2024 0849   ALT 49 04/05/2013 1943   ALKPHOS 141 (H) 04/29/2024 0849   ALKPHOS 98 04/05/2013 1943   BILITOT 0.2 04/29/2024 0849   BILITOT 0.4 01/07/2024 0815   BILITOT 0.2 04/05/2013 1943   GFRNONAA >60 04/29/2024 0849   GFRNONAA >89 07/31/2015 0846   GFRAA >60 05/24/2019 1246   GFRAA >89 07/31/2015 0846     Lab Results  Component Value Date   WBC 8.7 04/29/2024   NEUTROABS 6.5 04/29/2024   HGB 11.0 (L) 04/29/2024   HCT 34.2 (L) 04/29/2024   MCV 86.1 04/29/2024   PLT 387 04/29/2024      Chemistry      Component Value Date/Time   NA  140 04/29/2024 0849   NA 140 04/05/2013 1943   K 3.5 04/29/2024 0849   K 3.7 04/05/2013 1943   CL 103 04/29/2024 0849   CL 106 04/05/2013 1943   CO2 21 (L) 04/29/2024 0849   CO2 30 04/05/2013 1943   BUN 10 04/29/2024 0849   BUN 8 04/05/2013 1943   CREATININE 0.64 04/29/2024 0849   CREATININE 0.87 02/03/2024 0906      Component Value Date/Time   CALCIUM 9.7 04/29/2024 0849   CALCIUM 8.9 04/05/2013 1943   ALKPHOS 141 (H) 04/29/2024 0849   ALKPHOS 98 04/05/2013 1943   AST 48 (H) 04/29/2024 0849   AST 28 04/05/2013 1943   ALT 78 (H) 04/29/2024 0849   ALT 49 04/05/2013 1943   BILITOT 0.2 04/29/2024 0849   BILITOT 0.4 01/07/2024 0815   BILITOT 0.2 04/05/2013 1943        RADIOGRAPHIC STUDIES: I have personally reviewed the radiological images as listed and agreed with the findings in the report.  "

## 2024-04-29 NOTE — Patient Instructions (Signed)

## 2024-04-29 NOTE — Patient Instructions (Signed)
 [facility] [place] - A DEPT OF [facility]  Discharge Instructions: Thank you for choosing [facility] to provide your oncology and hematology care.  If you have a lab appointment with the Cancer Center - please note that after April 8th, 2024, all labs will be drawn in the cancer center.  You do not have to check in or register with the main entrance as you have in the past but will complete your check-in in the cancer center.  Wear comfortable clothing and clothing appropriate for easy access to any Portacath or PICC line.   We strive to give you quality time with your provider. You may need to reschedule your appointment if you arrive late (15 or more minutes).  Arriving late affects you and other patients whose appointments are after yours.  Also, if you miss three or more appointments without notifying the office, you may be dismissed from the clinic at the providers discretion.      For prescription refill requests, have your pharmacy contact our office and allow 72 hours for refills to be completed.    Today you received the following chemotherapy and/or immunotherapy agents: Nivolumab , Ipilimumab , Carboplatin    To help prevent nausea and vomiting after your treatment, we encourage you to take your nausea medication as directed.  BELOW ARE SYMPTOMS THAT SHOULD BE REPORTED IMMEDIATELY: *FEVER GREATER THAN 100.4 F (38 C) OR HIGHER *CHILLS OR SWEATING *NAUSEA AND VOMITING THAT IS NOT CONTROLLED WITH YOUR NAUSEA MEDICATION *UNUSUAL SHORTNESS OF BREATH *UNUSUAL BRUISING OR BLEEDING *URINARY PROBLEMS (pain or burning when urinating, or frequent urination) *BOWEL PROBLEMS (unusual diarrhea, constipation, pain near the anus) TENDERNESS IN MOUTH AND THROAT WITH OR WITHOUT PRESENCE OF ULCERS (sore throat, sores in mouth, or a toothache) UNUSUAL RASH, SWELLING OR PAIN  UNUSUAL VAGINAL DISCHARGE OR ITCHING   Items with * indicate a potential emergency  and should be followed up as soon as possible or go to the Emergency Department if any problems should occur.  Please show the CHEMOTHERAPY ALERT CARD or IMMUNOTHERAPY ALERT CARD at check-in to the Emergency Department and triage nurse.  Should you have questions after your visit or need to cancel or reschedule your appointment, please contact [facility] [place] - A DEPT OF [facility] [phone]  and follow the prompts.  Office hours are 8:00 a.m. to 4:30 p.m. Monday - Friday. Please note that voicemails left after 4:00 p.m. may not be returned until the following business day.  We are closed weekends and major holidays. You have access to a nurse at all times for urgent questions. Please call the main number to the clinic [phone] and follow the prompts.  For any non-urgent questions, you may also contact your provider using MyChart. We now offer e-Visits for anyone 78 and older to request care online for non-urgent symptoms. For details visit [email or web address].   Also download the MyChart app! Go to the app store, search MyChart, open the app, select [facility], and log in with your MyChart username and password.

## 2024-04-29 NOTE — Progress Notes (Signed)
 Patient has been examined by Dr. Davonna. Vital signs and labs have been reviewed by MD - ANC, Creatinine, LFTs, hemoglobin, and platelets have been reviewed by M.D. - pt may proceed with treatment.  Primary RN and pharmacy notified.

## 2024-04-29 NOTE — Telephone Encounter (Signed)
" °*  STAT* If patient is at the pharmacy, call can be transferred to refill team.   1. Which medications need to be refilled? (please list name of each medication and dose if known)  Evolocumab  (REPATHA  SURECLICK) 140 MG/ML SOAJ   2. Which pharmacy/location (including street and city if local pharmacy) is medication to be sent to? [place] [facility]   3. Do they need a 30 day or 90 day supply?  90 day supply   Patient says he is completely out of medication and his injection is scheduled for tomorrow.  "

## 2024-05-04 ENCOUNTER — Emergency Department (HOSPITAL_COMMUNITY)

## 2024-05-04 ENCOUNTER — Telehealth: Payer: Self-pay | Admitting: *Deleted

## 2024-05-04 ENCOUNTER — Emergency Department (HOSPITAL_COMMUNITY)
Admission: EM | Admit: 2024-05-04 | Discharge: 2024-05-04 | Disposition: A | Discharge status: Home / Self Care | Attending: Emergency Medicine | Admitting: Emergency Medicine

## 2024-05-04 ENCOUNTER — Other Ambulatory Visit: Payer: Self-pay

## 2024-05-04 ENCOUNTER — Other Ambulatory Visit: Payer: Self-pay | Admitting: *Deleted

## 2024-05-04 ENCOUNTER — Encounter (HOSPITAL_COMMUNITY): Payer: Self-pay

## 2024-05-04 DIAGNOSIS — Z85118 Personal history of other malignant neoplasm of bronchus and lung: Secondary | ICD-10-CM | POA: Insufficient documentation

## 2024-05-04 DIAGNOSIS — C3492 Malignant neoplasm of unspecified part of left bronchus or lung: Secondary | ICD-10-CM

## 2024-05-04 DIAGNOSIS — R079 Chest pain, unspecified: Secondary | ICD-10-CM

## 2024-05-04 DIAGNOSIS — Z79899 Other long term (current) drug therapy: Secondary | ICD-10-CM | POA: Insufficient documentation

## 2024-05-04 DIAGNOSIS — G893 Neoplasm related pain (acute) (chronic): Secondary | ICD-10-CM

## 2024-05-04 DIAGNOSIS — C3490 Malignant neoplasm of unspecified part of unspecified bronchus or lung: Secondary | ICD-10-CM | POA: Insufficient documentation

## 2024-05-04 DIAGNOSIS — R918 Other nonspecific abnormal finding of lung field: Secondary | ICD-10-CM

## 2024-05-04 LAB — CBC WITH DIFFERENTIAL/PLATELET
Basophils Absolute: 0 10*3/uL (ref 0.0–0.1)
Basophils Relative: 0 %
Eosinophils Absolute: 0.1 10*3/uL (ref 0.0–0.5)
Eosinophils Relative: 3 %
HCT: 33.5 % — ABNORMAL LOW (ref 39.0–52.0)
Hemoglobin: 11 g/dL — ABNORMAL LOW (ref 13.0–17.0)
Lymphocytes Relative: 42 %
Lymphs Abs: 1.7 10*3/uL (ref 0.7–4.0)
MCH: 27.9 pg (ref 26.0–34.0)
MCHC: 32.8 g/dL (ref 30.0–36.0)
MCV: 85 fL (ref 80.0–100.0)
Monocytes Absolute: 0 10*3/uL — ABNORMAL LOW (ref 0.1–1.0)
Monocytes Relative: 0 %
Neutro Abs: 2.2 10*3/uL (ref 1.7–7.7)
Neutrophils Relative %: 55 %
Platelets: 298 10*3/uL (ref 150–400)
RBC: 3.94 MIL/uL — ABNORMAL LOW (ref 4.22–5.81)
RDW: 16.2 % — ABNORMAL HIGH (ref 11.5–15.5)
WBC: 4 10*3/uL (ref 4.0–10.5)
nRBC: 0 % (ref 0.0–0.2)

## 2024-05-04 LAB — COMPREHENSIVE METABOLIC PANEL WITH GFR
ALT: 147 U/L — ABNORMAL HIGH (ref 0–44)
AST: 58 U/L — ABNORMAL HIGH (ref 15–41)
Albumin: 3.9 g/dL (ref 3.5–5.0)
Alkaline Phosphatase: 129 U/L — ABNORMAL HIGH (ref 38–126)
Anion gap: 15 (ref 5–15)
BUN: 14 mg/dL (ref 6–20)
CO2: 22 mmol/L (ref 22–32)
Calcium: 9.1 mg/dL (ref 8.9–10.3)
Chloride: 100 mmol/L (ref 98–111)
Creatinine, Ser: 0.62 mg/dL (ref 0.61–1.24)
GFR, Estimated: >60 mL/min
Glucose, Bld: 90 mg/dL (ref 70–99)
Potassium: 3.5 mmol/L (ref 3.5–5.1)
Sodium: 137 mmol/L (ref 135–145)
Total Bilirubin: 0.3 mg/dL (ref 0.0–1.2)
Total Protein: 7 g/dL (ref 6.5–8.1)

## 2024-05-04 LAB — TROPONIN T, HIGH SENSITIVITY: Troponin T High Sensitivity: <6 ng/L (ref 0–19)

## 2024-05-04 LAB — LIPASE, BLOOD: Lipase: 224 U/L — ABNORMAL HIGH (ref 11–51)

## 2024-05-04 MED ORDER — IOHEXOL 350 MG/ML SOLN
100.0000 mL | Freq: Once | INTRAVENOUS | Status: AC | PRN
  Administered 2024-05-04: 100 mL via INTRAVENOUS

## 2024-05-04 MED ORDER — HYDROMORPHONE HCL 1 MG/ML IJ SOLN
0.5000 mg | Freq: Once | INTRAMUSCULAR | Status: AC
  Administered 2024-05-04: 0.5 mg via INTRAVENOUS
  Filled 2024-05-04: qty 0.5

## 2024-05-04 MED ORDER — PANTOPRAZOLE SODIUM 40 MG IV SOLR
40.0000 mg | Freq: Once | INTRAVENOUS | Status: AC
  Administered 2024-05-04: 40 mg via INTRAVENOUS
  Filled 2024-05-04: qty 10

## 2024-05-04 MED ORDER — FENTANYL CITRATE (PF) 100 MCG/2ML IJ SOLN
50.0000 ug | Freq: Once | INTRAMUSCULAR | Status: AC
  Administered 2024-05-04: 50 ug via INTRAVENOUS
  Filled 2024-05-04: qty 2

## 2024-05-04 MED ORDER — OXYCODONE HCL 10 MG PO TABS: 10.0000 mg | ORAL_TABLET | ORAL | 0 refills | Status: AC | PRN

## 2024-05-04 NOTE — ED Triage Notes (Signed)
 Pt presents with feelings of air in his chest and indigestion that has gotten worse after his chemo on Thursday. Pt states that he has been having bad hiccups and now feels like there is air trapped in chest. Pt hx of lung cancer.

## 2024-05-06 ENCOUNTER — Other Ambulatory Visit: Payer: Self-pay

## 2024-05-06 ENCOUNTER — Ambulatory Visit (HOSPITAL_COMMUNITY): Admitting: Anesthesiology

## 2024-05-06 ENCOUNTER — Encounter (HOSPITAL_COMMUNITY): Admission: RE | Disposition: A | Payer: Self-pay | Source: Home / Self Care | Attending: Internal Medicine

## 2024-05-06 ENCOUNTER — Encounter (HOSPITAL_COMMUNITY): Payer: Self-pay | Admitting: Internal Medicine

## 2024-05-06 ENCOUNTER — Ambulatory Visit (HOSPITAL_COMMUNITY)
Admission: RE | Admit: 2024-05-06 | Discharge: 2024-05-06 | Disposition: A | Source: Home / Self Care | Attending: Internal Medicine | Admitting: Internal Medicine

## 2024-05-06 DIAGNOSIS — K6289 Other specified diseases of anus and rectum: Secondary | ICD-10-CM

## 2024-05-06 MED ORDER — PROPOFOL 500 MG/50ML IV EMUL
INTRAVENOUS | Status: DC | PRN
  Administered 2024-05-06: 125 ug/kg/min via INTRAVENOUS
  Administered 2024-05-06: 20 mg via INTRAVENOUS
  Administered 2024-05-06: 160 mg via INTRAVENOUS
  Administered 2024-05-06: 20 mg via INTRAVENOUS

## 2024-05-06 MED ORDER — LACTATED RINGERS IV SOLN: INTRAVENOUS | Status: DC | PRN

## 2024-05-06 NOTE — Anesthesia Postprocedure Evaluation (Signed)
"   Anesthesia Post Note  Patient: Jeremy Hancock  Procedure(s) Performed: COLONOSCOPY  Patient location during evaluation: Phase II Anesthesia Type: MAC Level of consciousness: awake Pain management: pain level controlled Vital Signs Assessment: post-procedure vital signs reviewed and stable Respiratory status: spontaneous breathing and respiratory function stable Cardiovascular status: blood pressure returned to baseline and stable Postop Assessment: no headache and no apparent nausea or vomiting Anesthetic complications: no Comments: Late entry   No notable events documented.   Last Vitals:  Vitals:   05/06/24 0856 05/06/24 0900  BP: (!) 85/58 (!) 94/53  Pulse: 66 76  Resp: 14 18  Temp: 36.4 C   SpO2: 98% 98%    Last Pain:  Vitals:   05/06/24 0901  TempSrc:   PainSc: 0-No pain                 Yvonna PARAS Irlene Crudup      "

## 2024-05-06 NOTE — Anesthesia Procedure Notes (Signed)
 Date/Time: 05/06/2024 8:22 AM  Performed by: Barbarann Verneita RAMAN, CRNAPre-anesthesia Checklist: Patient identified, Emergency Drugs available, Suction available, Timeout performed and Patient being monitored Patient Re-evaluated:Patient Re-evaluated prior to induction Oxygen Delivery Method: Nasal Cannula

## 2024-05-06 NOTE — Transfer of Care (Signed)
 Immediate Anesthesia Transfer of Care Note  Patient: Jeremy Hancock  Procedure(s) Performed: COLONOSCOPY  Patient Location: Endoscopy Unit  Anesthesia Type:MAC  Level of Consciousness: drowsy  Airway & Oxygen Therapy: Patient Spontanous Breathing  Post-op Assessment: Report given to RN and Post -op Vital signs reviewed and stable  Post vital signs: Reviewed and stable  Last Vitals:  Vitals Value Taken Time  BP 85/58 05/06/24 08:56  Temp 36.4 C 05/06/24 08:56  Pulse 66 05/06/24 08:56  Resp 14 05/06/24 08:56  SpO2 98 % 05/06/24 08:56    Last Pain:  Vitals:   05/06/24 0856  TempSrc: Axillary  PainSc:       Patients Stated Pain Goal: 6 (05/06/24 0723)  Complications: No notable events documented.

## 2024-05-06 NOTE — Anesthesia Preprocedure Evaluation (Signed)
"                                    Anesthesia Evaluation  Patient identified by MRN, date of birth, ID band Patient awake    Reviewed: Allergy & Precautions, H&P , NPO status , Patient's Chart, lab work & pertinent test results, reviewed documented beta blocker date and time   Airway Mallampati: II  TM Distance: >3 FB Neck ROM: full    Dental no notable dental hx.    Pulmonary neg pulmonary ROS, former smoker   Pulmonary exam normal breath sounds clear to auscultation       Cardiovascular Exercise Tolerance: Good hypertension, + CAD  negative cardio ROS  Rhythm:regular Rate:Normal     Neuro/Psych  Neuromuscular disease negative neurological ROS  negative psych ROS   GI/Hepatic negative GI ROS, Neg liver ROS,,,  Endo/Other  negative endocrine ROS    Renal/GU negative Renal ROS  negative genitourinary   Musculoskeletal   Abdominal   Peds  Hematology negative hematology ROS (+)   Anesthesia Other Findings   Reproductive/Obstetrics negative OB ROS                              Anesthesia Physical Anesthesia Plan  ASA: 2  Anesthesia Plan: MAC   Post-op Pain Management:    Induction:   PONV Risk Score and Plan: Propofol  infusion  Airway Management Planned:   Additional Equipment:   Intra-op Plan:   Post-operative Plan:   Informed Consent: I have reviewed the patients History and Physical, chart, labs and discussed the procedure including the risks, benefits and alternatives for the proposed anesthesia with the patient or authorized representative who has indicated his/her understanding and acceptance.     Dental Advisory Given  Plan Discussed with: CRNA  Anesthesia Plan Comments:         Anesthesia Quick Evaluation  "

## 2024-05-06 NOTE — Discharge Instructions (Addendum)
" °  Colonoscopy Discharge Instructions  Read the instructions outlined below and refer to this sheet in the next few weeks. These discharge instructions provide you with general information on caring for yourself after you leave the hospital. Your doctor may also give you specific instructions. While your treatment has been planned according to the most current medical practices available, unavoidable complications occasionally occur. If you have any problems or questions after discharge, call Dr. Shaaron at [phone]. ACTIVITY You may resume your regular activity, but move at a slower pace for the next 24 hours.  Take frequent rest periods for the next 24 hours.  Walking will help get rid of the air and reduce the bloated feeling in your belly (abdomen).  No driving for 24 hours (because of the medicine (anesthesia) used during the test).   Do not sign any important legal documents or operate any machinery for 24 hours (because of the anesthesia used during the test).  NUTRITION Drink plenty of fluids.  You may resume your normal diet as instructed by your doctor.  Begin with a light meal and progress to your normal diet. Heavy or fried foods are harder to digest and may make you feel sick to your stomach (nauseated).  Avoid alcoholic beverages for 24 hours or as instructed.  MEDICATIONS You may resume your normal medications unless your doctor tells you otherwise.  WHAT YOU CAN EXPECT TODAY Some feelings of bloating in the abdomen.  Passage of more gas than usual.  Spotting of blood in your stool or on the toilet paper.  IF YOU HAD POLYPS REMOVED DURING THE COLONOSCOPY: No aspirin  products for 7 days or as instructed.  No alcohol for 7 days or as instructed.  Eat a soft diet for the next 24 hours.  FINDING OUT THE RESULTS OF YOUR TEST Not all test results are available during your visit. If your test results are not back during the visit, make an appointment with your caregiver to find out the  results. Do not assume everything is normal if you have not heard from your caregiver or the medical facility. It is important for you to follow up on all of your test results.  SEEK IMMEDIATE MEDICAL ATTENTION IF: You have more than a spotting of blood in your stool.  Your belly is swollen (abdominal distention).  You are nauseated or vomiting.  You have a temperature over 101.  You have abdominal pain or discomfort that is severe or gets worse throughout the day.      Your colonoscopy was normal except for a mass in your rectum.  This appears to be at least coming from the outside of your rectum and not a primary rectal tumor.   Biopsies were taken.    Further recommendations to follow pending review of biopsy report. "

## 2024-05-06 NOTE — Op Note (Signed)
 [facility] Patient Name: Jeremy Hancock Procedure Date: 05/06/2024 8:04 AM MRN: [id number] Date of Birth: Jan 26, 1971 Attending MD: Jeremy Hancock , MD, [id number] CSN: [id number] Age: 54 Admit Type: Outpatient Procedure:                Colonoscopy Indications:              Rectal mass Providers:                Jeremy Ozell Hollingshead, MD, Rosina Sprague, Bascom Blush Referring MD:              Medicines:                Propofol  per Anesthesia Complications:            No immediate complications. Estimated Blood Loss:     Estimated blood loss was minimal. Procedure:                Pre-Anesthesia Assessment:                           - Prior to the procedure, a History and Physical                            was performed, and patient medications and                            allergies were reviewed. The patient's tolerance of                            previous anesthesia was also reviewed. The risks                            and benefits of the procedure and the sedation                            options and risks were discussed with the patient.                            All questions were answered, and informed consent                            was obtained. Prior Anticoagulants: The patient has                            taken no anticoagulant or antiplatelet agents. ASA                            Grade Assessment: III - A patient with severe                            systemic disease. After reviewing the risks and                            benefits, the patient was deemed in satisfactory  condition to undergo the procedure.                           After obtaining informed consent, the colonoscope                            was passed under direct vision. Throughout the                            procedure, the patient's blood pressure, pulse, and                            oxygen saturations were monitored continuously. The                             [id number]-[id number] ([id number]) Colon was introduced through                            the anus and advanced to the the cecum, identified                            by appendiceal orifice and ileocecal valve. The                            colonoscopy was performed without difficulty. The                            patient tolerated the procedure well. The quality                            of the bowel preparation was adequate. The                            ileocecal valve, appendiceal orifice, and rectum                            were photographed. Scope In: 8:28:37 AM Scope Out: 8:50:52 AM Scope Withdrawal Time: 0 hours 17 minutes 9 seconds  Total Procedure Duration: 0 hours 22 minutes 15 seconds  Findings:      The digital rectal exam revealed a an approximately 2 x 2 cm mass which       is palpable somewhat through the rectal wall it is firm. Endoscopic       findings confirms approximate 2 x 2 cm mass and 10 cm from the anal       verge somewhat overlying reticulated rectal mucosa. This is at least a       submucosal lesion or component lesion outside the rectum producing       extrinsic compression. It is quite firm. Please see photographs.      The colon (entire examined portion) appeared normal.      The exam was otherwise without abnormality on direct and retroflexion       views. I elected to utilize jumbo biopsy forceps and take biopsy on       biopsy of this lesion in the rectum. Excellent specimens were obtained  for the pathologist. There was a paucity of bleeding with this maneuver.       Specimens came off in chunks. Pura stat applied post biopsy to ensure       hemostasis. Impression:               - Rectal exam revealed a Mass. 2 x 2 cm at least                            partially submucosal mass versus extrinsic                            component as described-status post biopsy with                            jumbo biopsy forceps.                            -Rectal lesion did not appear to be a primary                            rectal adenocarcinoma                           -.Purastat applied to ensure biopsy site hemostasis.                           - The entire examined colon is normal.                           - The examination was otherwise normal on direct                            and retroflexion views.                           - Moderate Sedation:      Moderate (conscious) sedation was personally administered by an       anesthesia professional. The following parameters were monitored: oxygen       saturation, heart rate, blood pressure, respiratory rate, EKG, adequacy       of pulmonary ventilation, and response to care. Recommendation:           - Patient has a contact number available for                            emergencies. The signs and symptoms of potential                            delayed complications were discussed with the                            patient. Return to normal activities tomorrow.                            Written discharge instructions were provided to the  patient.                           - Advance diet as tolerated.                           - Continue present medications.                           - Repeat colonoscopy date to be determined after                            pending pathology results are reviewed for                            surveillance based on pathology results.                           - Return to GI office (date not yet determined). Procedure Code(s):        --- Professional ---                           [phone], Colonoscopy, flexible; diagnostic, including                            collection of specimen(s) by brushing or washing,                            when performed (separate procedure) Diagnosis Code(s):        --- Professional ---                           K62.89, Other specified diseases of anus and rectum CPT copyright 2022 [employer]  [employer]. All rights reserved. The codes documented in this report are preliminary and upon coder review may  be revised to meet current compliance requirements. Jeremy HERO. Garland Smouse, MD Jeremy Ozell Hollingshead, MD 05/06/2024 9:14:05 AM This report has been signed electronically. Number of Addenda: 0

## 2024-05-06 NOTE — Interval H&P Note (Signed)
 History and Physical Interval Note:  05/06/2024 8:14 AM  Jeremy Hancock  has presented today for surgery, with the diagnosis of rectal mass, FHX: colon cancer.  The various methods of treatment have been discussed with the patient and family. After consideration of risks, benefits and other options for treatment, the patient has consented to  Procedures with comments: COLONOSCOPY (N/A) - 830am as a surgical intervention.  The patient's history has been reviewed, patient examined, no change in status, stable for surgery.  I have reviewed the patient's chart and labs.  Questions were answered to the patient's satisfaction.     Krista Godsil    No change.  Rectal mass suggested on DRE and imaging.  Colonoscopy  The risks, benefits, limitations, alternatives and imponderables have been reviewed with the patient. Questions have been answered. All parties are agreeable.

## 2024-05-07 ENCOUNTER — Ambulatory Visit (HOSPITAL_COMMUNITY): Admission: RE | Admit: 2024-05-07 | Source: Home / Self Care | Admitting: Internal Medicine

## 2024-05-07 ENCOUNTER — Encounter (HOSPITAL_COMMUNITY): Admission: RE | Payer: Self-pay | Source: Home / Self Care

## 2024-05-07 LAB — SURGICAL PATHOLOGY

## 2024-05-20 ENCOUNTER — Inpatient Hospital Stay

## 2024-05-20 ENCOUNTER — Inpatient Hospital Stay: Admitting: Oncology

## 2024-05-20 ENCOUNTER — Inpatient Hospital Stay: Attending: Oncology

## 2024-06-09 ENCOUNTER — Inpatient Hospital Stay: Admitting: Oncology

## 2024-06-09 ENCOUNTER — Inpatient Hospital Stay: Attending: Oncology

## 2024-06-09 ENCOUNTER — Inpatient Hospital Stay

## 2024-09-16 ENCOUNTER — Encounter
